# Patient Record
Sex: Male | Born: 1970 | Race: Black or African American | Hispanic: No | Smoking: Never smoker
Health system: Southern US, Community
[De-identification: ages and names within clinical notes are randomized; demographics above are authoritative.]

## PROBLEM LIST (undated history)

## (undated) DIAGNOSIS — E119 Type 2 diabetes mellitus without complications: Secondary | ICD-10-CM

## (undated) DIAGNOSIS — J45909 Unspecified asthma, uncomplicated: Secondary | ICD-10-CM

## (undated) DIAGNOSIS — I639 Cerebral infarction, unspecified: Secondary | ICD-10-CM

## (undated) DIAGNOSIS — I219 Acute myocardial infarction, unspecified: Secondary | ICD-10-CM

## (undated) DIAGNOSIS — K219 Gastro-esophageal reflux disease without esophagitis: Secondary | ICD-10-CM

## (undated) DIAGNOSIS — I10 Essential (primary) hypertension: Secondary | ICD-10-CM

## (undated) DIAGNOSIS — M5430 Sciatica, unspecified side: Secondary | ICD-10-CM

## (undated) DIAGNOSIS — L309 Dermatitis, unspecified: Secondary | ICD-10-CM

## (undated) HISTORY — DX: Type 2 diabetes mellitus without complications: E11.9

## (undated) HISTORY — DX: Gastro-esophageal reflux disease without esophagitis: K21.9

## (undated) HISTORY — DX: Dermatitis, unspecified: L30.9

## (undated) HISTORY — PX: HERNIA REPAIR: SHX51

## (undated) HISTORY — DX: Acute myocardial infarction, unspecified: I21.9

## (undated) HISTORY — DX: Cerebral infarction, unspecified: I63.9

---

## 1997-12-07 ENCOUNTER — Emergency Department (HOSPITAL_COMMUNITY): Admission: EM | Admit: 1997-12-07 | Discharge: 1997-12-07 | Payer: Self-pay | Admitting: Emergency Medicine

## 1999-05-29 ENCOUNTER — Emergency Department (HOSPITAL_COMMUNITY): Admission: EM | Admit: 1999-05-29 | Discharge: 1999-05-29 | Payer: Self-pay | Admitting: Emergency Medicine

## 2001-10-22 ENCOUNTER — Emergency Department (HOSPITAL_COMMUNITY): Admission: EM | Admit: 2001-10-22 | Discharge: 2001-10-22 | Payer: Self-pay | Admitting: Emergency Medicine

## 2003-01-29 ENCOUNTER — Emergency Department (HOSPITAL_COMMUNITY): Admission: EM | Admit: 2003-01-29 | Discharge: 2003-01-29 | Payer: Self-pay | Admitting: *Deleted

## 2003-01-29 ENCOUNTER — Encounter: Payer: Self-pay | Admitting: *Deleted

## 2006-12-24 ENCOUNTER — Observation Stay (HOSPITAL_COMMUNITY): Admission: EM | Admit: 2006-12-24 | Discharge: 2006-12-25 | Payer: Self-pay | Admitting: Emergency Medicine

## 2010-09-08 NOTE — Discharge Summary (Signed)
NAME:  Peter Bell, Peter Bell             ACCOUNT NO.:  [id number]   MEDICAL RECORD NO.:  [id number]          PATIENT TYPE:  INP   LOCATION:  3705                         FACILITY:  [facility]   PHYSICIAN:  Ritta Slot, MD     DATE OF BIRTH:  Jun 09, 1970   DATE OF ADMISSION:  12/24/2006  DATE OF DISCHARGE:  12/25/2006                               DISCHARGE SUMMARY   HISTORY OF PRESENT ILLNESS:  Mr. Lierman is a 40 year old African-  American male patient who came to the hospital because of chest pain.  He had right sided chest pain.  It was worse on the day he came to the  emergency room.  It apparently started two days prior at work.  He had  not seen a doctor for a long time.  His blood pressure on admission was  151/95.  He was seen by Dr. Lynnea Ferrier, thought he should be observed  overnight and ruled out for an MI because his first troponins they were  point of care markers in the ER were 0.19 and 0.28.  Thus, he was  admitted.  He was put of Lovenox, beta blocker and lisinopril and  aspirin, and statin.  His cardiac enzymes however came back negative and  troponins were negative.  On the morning of December 25, 2006 he was seen  by Dr. Lynnea Ferrier.  His blood pressure was stable at 121/72.  His heart  rate was 66.  His temperature was 98.9.  His labs were normal.  He was  considered stable for discharge home.   LABS:  Lipid profile - cholesterol 179, triglycerides 32, HDL 34, and  was 139.  Sodium was 139, potassium was 4.1, BUN was 8, creatinine was  0.88, chloride 107, CO2 28.  His TSH was 0.812.  Magnesium was 1.8.  D-  dimer was 0.36.  CBC - hemoglobin 14.2, hematocrit 42.2, WBCs 10.8 and  platelets are 281.  His EKG was normal sinus rhythm.  Chest x-ray showed  cardiac size upper limits of normal, otherwise no acute abnormality.   DISCHARGE MEDICATIONS:  1. Simvastatin 40 mg at bedtime everyday.  2. Aspirin 81 mg a day.  3. Metoprolol 50 mg a day.  4. Pepcid 20 mg a day.   Our office  will call him for an appointment for a treadmill Myoview  test, which should be done before Thursday his day to go back to work.  He is to follow up with his new primary care doctor, Dr. Lupe Carney  and he will follow up with Dr. Lynnea Ferrier after his treadmill test is  complete.      Lezlie Octave, N.P.      Ritta Slot, MD  Electronically Signed    BB/MEDQ  D:  12/25/2006  T:  12/25/2006  Job:  [id number]   cc:   L. Lupe Carney, M.D.

## 2010-09-08 NOTE — H&P (Signed)
NAME:  DAMAREA, MERKEL             ACCOUNT NO.:  [id number]   MEDICAL RECORD NO.:  [id number]          PATIENT TYPE:  INP   LOCATION:  3705                         FACILITY:  [facility]   PHYSICIAN:  Ritta Slot, MD     DATE OF BIRTH:  12-28-1970   DATE OF ADMISSION:  12/24/2006  DATE OF DISCHARGE:  12/25/2006                              HISTORY & PHYSICAL   HISTORY:  I had the pleasure of seeing Mr. Kiante who is a 40 year old  gentleman with onset of right-sided chest pain that has been going on  now for the past few days.  He came to the emergency room today because  the pain has been continuing and he did not want to let it go  unresolved.  He describes his pain as being right-sided, which feels  like sharp in nature with pain that is relieved by raising his right  arm.  Each episode lasts about 5 hours.  The pattern is episodic.  The  pain has no radiation.  It is characterized as sharp, he feels that the  tip of his index finger is numb on his right hand.  Symptoms described  as moderate.  Symptoms have no aggravating factors.  Raising his arm  above his head improves this.  There is no nausea, vomiting, dyspnea, or  fever associated with it.  It has been associated with a cough.  Concern  however that his troponin's here in the emergency room have come back  positive and have actually slightly increased from 0.19 to 0.28  nanograms per mL.  His ECGs also have been of concern and they have  shown LVH, but also shown T wave inversion at 2, 3, AVF, and nonspecific  T-wave flattening laterally.  He is now currently pain free.  He is  otherwise doing well.   PAST MEDICAL HISTORY:  Unremarkable.  He denies any cardiac risk factors  in terms of hypertension, dyslipidemia, smoking, or premature  history  of coronary artery disease.  His blood pressure on admission was noted  to be 161/95.   MEDICATIONS:  None.   ALLERGIES:  NO KNOWN DRUG ALLERGIES.   SOCIAL HISTORY:  Lives with  his girlfriend.  He works at [employer] and does a lot of walking for them.  He does not smoke, does not  drink alcohol, does not have any kids, does not use any illicit drugs.   FAMILY HISTORY:  Positive for his brother having had a stroke at the age  41 years old and hypertension amongst his siblings.   REVIEW OF SYSTEMS:  A 10-point review of systems are otherwise  unremarkable.   PHYSICAL EXAMINATION:  GENERAL:  He is a well-looking individual in no  acute distress.  He has no chest pain.  VITAL SIGNS:  Blood pressure is 105/80, heart rate 60.  NECK:  No jugular venous distention.  No carotid bruits.  No  thyromegaly.  HEENT:  Unremarkable.  CARDIOVASCULAR:  Essentially benign without any heaves or thrills.  Auscultation revealed heart sounds 1 and 2 without any murmurs, rubs, or  gallops.  LUNGS:  Clear to auscultation bilaterally.  Good air entry bilaterally.  No crepitations or rhonchi noted.  ABDOMEN:  Soft and nontender.  No hepatosplenomegaly.  Bowel sounds are  present.  EXTREMITIES:  Unremarkable.   RADIOGRAPHIC:  ECG showed normal sinus rhythm with LVH.  Nonspecific ST-  T wave changes inferolaterally that were dynamic.   LABORATORY DATA:  Showed a creatinine of 0.81.  A normal CBC count and a  normal BMET.   Chest x-ray was unremarkable.   Of note his troponin has gone from 0.19 to 0.28 nanograms/mL.   IMPRESSION:  The patient is a 40 year old gentleman with an undiagnosed  hypertension, now having chest pain admitted with unstable angina and  now chest pain free.   PLAN:  We will treat him with ACS protocol.  We will start him on:  1. Metoprolol 25 mg p.o. b.i.d.  2. Aspirin 325 mg daily.  3. Lisinopril 5 mg daily.  4. Zocor 40 mg daily.  5. Lovenox 70 mg subcu b.i.d.  6. Nitroglycerin p.r.n.  7. Serial EKGs and troponin's, we will review them in the morning.      Admitted to the telemetry bed.   My plan is to do a diagnostic cardiac  catheterization while he is an  inpatient here at [facility].      Ritta Slot, MD  Electronically Signed     HS/MEDQ  D:  12/24/2006  T:  12/25/2006  Job:  [id number]

## 2011-02-05 LAB — COMPREHENSIVE METABOLIC PANEL
ALT: 15
AST: 17
Albumin: 3.5
Alkaline Phosphatase: 82
BUN: 8
CO2: 26
Calcium: 8.8
Chloride: 104
Creatinine, Ser: 0.91
GFR calc Af Amer: >60
GFR calc non Af Amer: >60
Glucose, Bld: 110 — ABNORMAL HIGH
Potassium: 3.8
Sodium: 137
Total Bilirubin: 1.2
Total Protein: 7.5

## 2011-02-05 LAB — BASIC METABOLIC PANEL
BUN: 8
BUN: 8
CO2: 24
CO2: 28
Calcium: 8.8
Calcium: 9
Chloride: 106
Chloride: 107
Creatinine, Ser: 0.81
Creatinine, Ser: 0.88
GFR calc Af Amer: >60
GFR calc Af Amer: >60
GFR calc non Af Amer: >60
GFR calc non Af Amer: >60
Glucose, Bld: 101 — ABNORMAL HIGH
Glucose, Bld: 105 — ABNORMAL HIGH
Potassium: 3.8
Potassium: 4.1
Sodium: 137
Sodium: 139

## 2011-02-05 LAB — PROTIME-INR
INR: 1.1
Prothrombin Time: 14.6

## 2011-02-05 LAB — CBC
HCT: 41.9
HCT: 42.2
HCT: 43.6
Hemoglobin: 14.1
Hemoglobin: 14.2
Hemoglobin: 14.9
MCHC: 33.7
MCHC: 33.7
MCHC: 34.2
MCV: 93.3
MCV: 94
MCV: 94.1
Platelets: 281
Platelets: 301
Platelets: 307
RBC: 4.45
RBC: 4.49
RBC: 4.68
RDW: 12.6
RDW: 12.9
RDW: 13.1
WBC: 10.1
WBC: 10.8 — ABNORMAL HIGH
WBC: 11.1 — ABNORMAL HIGH

## 2011-02-05 LAB — APTT: aPTT: 30

## 2011-02-05 LAB — LIPID PANEL
Cholesterol: 171
Cholesterol: 179
HDL: 33 — ABNORMAL LOW
HDL: 34 — ABNORMAL LOW
LDL Cholesterol: 134 — ABNORMAL HIGH
LDL Cholesterol: 139 — ABNORMAL HIGH
Total CHOL/HDL Ratio: 5.2
Total CHOL/HDL Ratio: 5.3
Triglycerides: 19
Triglycerides: 32
VLDL: 4
VLDL: 6

## 2011-02-05 LAB — CARDIAC PANEL(CRET KIN+CKTOT+MB+TROPI)
CK, MB: 1.4
CK, MB: 1.8
Relative Index: 0.7
Relative Index: 0.9
Total CK: 158
Total CK: 249 — ABNORMAL HIGH
Troponin I: 0.02
Troponin I: 0.03

## 2011-02-05 LAB — D-DIMER, QUANTITATIVE: D-Dimer, Quant: 0.36

## 2011-02-05 LAB — I-STAT 8, (EC8 V) (CONVERTED LAB)
Acid-base deficit: 1
BUN: 10
Bicarbonate: 24.2 — ABNORMAL HIGH
Chloride: 108
Glucose, Bld: 111 — ABNORMAL HIGH
HCT: 51
Hemoglobin: 17.3 — ABNORMAL HIGH
Operator id: 257131
Potassium: 4.1
Sodium: 141
TCO2: 25
pCO2, Ven: 39.4 — ABNORMAL LOW
pH, Ven: 7.396 — ABNORMAL HIGH

## 2011-02-05 LAB — DIFFERENTIAL
Basophils Absolute: 0
Basophils Relative: 0
Eosinophils Absolute: 0
Eosinophils Relative: 0
Lymphocytes Relative: 9 — ABNORMAL LOW
Lymphs Abs: 1
Monocytes Absolute: 0.3
Monocytes Relative: 3
Neutro Abs: 9.8 — ABNORMAL HIGH
Neutrophils Relative %: 88 — ABNORMAL HIGH

## 2011-02-05 LAB — TROPONIN I: Troponin I: 0.03

## 2011-02-05 LAB — MAGNESIUM: Magnesium: 1.8

## 2011-02-05 LAB — POCT CARDIAC MARKERS
CKMB, poc: 4.7
CKMB, poc: 5.5
Myoglobin, poc: 90.1
Myoglobin, poc: 96.8
Operator id: 257131
Operator id: 257131
Troponin i, poc: 0.19 — ABNORMAL HIGH
Troponin i, poc: 0.28 — ABNORMAL HIGH

## 2011-02-05 LAB — RAPID URINE DRUG SCREEN, HOSP PERFORMED
Amphetamines: NOT DETECTED
Barbiturates: NOT DETECTED
Benzodiazepines: NOT DETECTED
Cocaine: NOT DETECTED
Opiates: POSITIVE — AB
Tetrahydrocannabinol: NOT DETECTED

## 2011-02-05 LAB — TSH: TSH: 0.812

## 2011-02-05 LAB — CK TOTAL AND CKMB (NOT AT ARMC)
CK, MB: 1.4
Relative Index: 0.9
Total CK: 148

## 2011-02-05 LAB — POCT I-STAT CREATININE
Creatinine, Ser: 0.9
Operator id: 257131

## 2012-01-18 ENCOUNTER — Emergency Department (HOSPITAL_COMMUNITY): Payer: BC Managed Care – HMO

## 2012-01-18 ENCOUNTER — Encounter (HOSPITAL_COMMUNITY): Payer: Self-pay | Admitting: Emergency Medicine

## 2012-01-18 ENCOUNTER — Emergency Department (HOSPITAL_COMMUNITY)
Admission: EM | Admit: 2012-01-18 | Discharge: 2012-01-18 | Disposition: A | Discharge status: Home Health | Payer: BC Managed Care – HMO | Attending: Emergency Medicine | Admitting: Emergency Medicine

## 2012-01-18 ENCOUNTER — Emergency Department (HOSPITAL_COMMUNITY)
Admission: EM | Admit: 2012-01-18 | Discharge: 2012-01-18 | Disposition: A | Discharge status: Nursing Home (Includes ICF) | Payer: BC Managed Care – HMO | Source: Home / Self Care | Attending: Family Medicine | Admitting: Family Medicine

## 2012-01-18 DIAGNOSIS — J45909 Unspecified asthma, uncomplicated: Secondary | ICD-10-CM | POA: Insufficient documentation

## 2012-01-18 DIAGNOSIS — I10 Essential (primary) hypertension: Secondary | ICD-10-CM

## 2012-01-18 DIAGNOSIS — R0789 Other chest pain: Secondary | ICD-10-CM

## 2012-01-18 DIAGNOSIS — R51 Headache: Secondary | ICD-10-CM | POA: Insufficient documentation

## 2012-01-18 DIAGNOSIS — R071 Chest pain on breathing: Secondary | ICD-10-CM | POA: Insufficient documentation

## 2012-01-18 HISTORY — DX: Essential (primary) hypertension: I10

## 2012-01-18 HISTORY — DX: Unspecified asthma, uncomplicated: J45.909

## 2012-01-18 LAB — CBC WITH DIFFERENTIAL/PLATELET
Blasts: 0 %
Eosinophils Absolute: 0.1 10*3/uL (ref 0.0–0.7)
Eosinophils Relative: 1 % (ref 0–5)
Lymphocytes Relative: 21 % (ref 12–46)
Lymphs Abs: 1.7 10*3/uL (ref 0.7–4.0)
MCV: 90.6 fL (ref 78.0–100.0)
Metamyelocytes Relative: 0 %
Monocytes Absolute: 0.5 10*3/uL (ref 0.1–1.0)
Monocytes Relative: 6 % (ref 3–12)
Neutro Abs: 5.9 10*3/uL (ref 1.7–7.7)
Platelets: 277 10*3/uL (ref 150–400)
RBC: 4.79 MIL/uL (ref 4.22–5.81)
WBC: 8.2 10*3/uL (ref 4.0–10.5)
nRBC: 0 /100 WBC

## 2012-01-18 LAB — URINALYSIS, ROUTINE W REFLEX MICROSCOPIC
Bilirubin Urine: NEGATIVE
Glucose, UA: NEGATIVE mg/dL
Ketones, ur: NEGATIVE mg/dL
Protein, ur: NEGATIVE mg/dL
Urobilinogen, UA: 0.2 mg/dL (ref 0.0–1.0)

## 2012-01-18 LAB — BASIC METABOLIC PANEL
BUN: 8 mg/dL (ref 6–23)
CO2: 27 mEq/L (ref 19–32)
Calcium: 9.3 mg/dL (ref 8.4–10.5)
Creatinine, Ser: 0.8 mg/dL (ref 0.50–1.35)
GFR calc non Af Amer: >90 mL/min (ref >90)
Glucose, Bld: 118 mg/dL — ABNORMAL HIGH (ref 70–99)
Sodium: 138 mEq/L (ref 135–145)

## 2012-01-18 LAB — URINE MICROSCOPIC-ADD ON

## 2012-01-18 MED ORDER — HYDROCHLOROTHIAZIDE 25 MG PO TABS: 12.5000 mg | ORAL_TABLET | Freq: Every day | ORAL | Status: DC

## 2012-01-18 MED ORDER — KETOROLAC TROMETHAMINE 15 MG/ML IJ SOLN
15.0000 mg | Freq: Once | INTRAMUSCULAR | Status: AC
  Administered 2012-01-18: 15 mg via INTRAVENOUS
  Filled 2012-01-18: qty 1

## 2012-01-18 NOTE — ED Provider Notes (Signed)
History     CSN: [id number]  Arrival date & time 01/18/12  0809   First MD Initiated Contact with Patient 01/18/12 [phone]      Chief Complaint  Patient presents with  . Hypertension    (Consider location/radiation/quality/duration/timing/severity/associated sxs/prior treatment) Patient is a 41 y.o. male presenting with hypertension. The history is provided by the patient.  Hypertension This is a new problem. The current episode started more than 2 days ago (dx'd approx 1.5 yr ago and started on med, took for 2 weeks , became wnl and med stopped,  onset 3 d ago of bad headaches and chest pains., assoc with elevated bp at home.). The problem has been gradually worsening. Associated symptoms include chest pain and headaches. Pertinent negatives include no shortness of breath.    Past Medical History  Diagnosis Date  . Asthma   . Hypertension     Past Surgical History  Procedure Date  . Hernia repair     No family history on file.  History  Substance Use Topics  . Smoking status: Never Smoker   . Smokeless tobacco: Not on file  . Alcohol Use: No      Review of Systems  Constitutional: Negative.   Eyes: Positive for visual disturbance.  Respiratory: Negative for shortness of breath.   Cardiovascular: Positive for chest pain. Negative for palpitations and leg swelling.  Neurological: Positive for headaches.    Allergies  Review of patient's allergies indicates no known allergies.  Home Medications   Current Outpatient Rx  Name Route Sig Dispense Refill  . HYDROCHLOROTHIAZIDE 25 MG PO TABS Oral Take 0.5 tablets (12.5 mg total) by mouth daily. 30 tablet 0  . ONE-DAILY MULTI VITAMINS PO TABS Oral Take 1 tablet by mouth daily.      BP 172/102  Pulse 68  Temp 98 F (36.7 C) (Oral)  Resp 16  SpO2 98%  Physical Exam  Nursing note and vitals reviewed. Constitutional: He is oriented to person, place, and time. He appears well-developed and well-nourished.  HENT:    Head: Normocephalic.  Right Ear: External ear normal.  Left Ear: External ear normal.  Mouth/Throat: Oropharynx is clear and moist.  Eyes: Conjunctivae normal and EOM are normal. Pupils are equal, round, and reactive to light.  Neck: Normal range of motion. Neck supple.  Cardiovascular: Normal rate, regular rhythm, normal heart sounds and intact distal pulses.   Pulmonary/Chest: Effort normal and breath sounds normal.  Abdominal: Soft. Bowel sounds are normal. There is no tenderness.  Musculoskeletal: He exhibits no edema.  Lymphadenopathy:    He has no cervical adenopathy.  Neurological: He is alert and oriented to person, place, and time.  Skin: Skin is warm and dry.    ED Course  Procedures (including critical care time)  Labs Reviewed - No data to display No results found.   1. Hypertension complications       MDM          Linna Hoff, MD 01/20/12 2050

## 2012-01-18 NOTE — ED Notes (Signed)
Results of troponin per POCT standards:   0.00 

## 2012-01-18 NOTE — ED Notes (Addendum)
Pt sent down from [facility] with htn associated with HA and CP x 3 days; pt denies complaint at present but sent for further eval; EKG performed at [facility]

## 2012-01-18 NOTE — ED Provider Notes (Signed)
History     CSN: [id number]  Arrival date & time 01/18/12  1027   First MD Initiated Contact with Patient 01/18/12 1022      Chief Complaint  Patient presents with  . Hypertension  . Headache  . Chest Pain    (Consider location/radiation/quality/duration/timing/severity/associated sxs/prior treatment) HPI Peter Bell is a 41 y.o. male w/ PMH of hypertension presents for chest pain, elevated BP and headache for 3 days. Hedescribes is chest pain as: left sided, non-radiating, aching, not associated with exertion, nausea/vomiting, diaphoresis, intermittent lasting for <1 hour when it is present, 4/10, not currently present. Has not taken anything for it.  Is not taking BP meds.  Denies smoking, illicit drugs or EtOH.  Headache is described as aching, frontal, aching, 4/10, relieved with excedrin, no numbness, dysarthria, coordination problems, tingling or weakness.  Pt complains of blurry vision when reading.  Past Medical History  Diagnosis Date  . Asthma   . Hypertension     Past Surgical History  Procedure Date  . Hernia repair     History reviewed. No pertinent family history.  History  Substance Use Topics  . Smoking status: Never Smoker   . Smokeless tobacco: Not on file  . Alcohol Use: No      Review of Systems At least 10pt or greater review of systems completed and are negative except where specified in the HPI.   Allergies  Review of patient's allergies indicates no known allergies.  Home Medications   Current Outpatient Rx  Name Route Sig Dispense Refill  . ONE-DAILY MULTI VITAMINS PO TABS Oral Take 1 tablet by mouth daily.      BP 155/90  Pulse 56  Temp 98.1 F (36.7 C) (Oral)  Resp 20  SpO2 100%  Physical Exam  Nursing notes reviewed.  Electronic medical record reviewed. VITAL SIGNS:   Filed Vitals:   01/18/12 1315 01/18/12 1330 01/18/12 1345 01/18/12 1358  BP: 133/80 120/78 126/75 126/75  Pulse: 52 54 56 57  Temp:        TempSrc:      Resp: 17 15 13 16   SpO2: 100% 100% 100% 100%   CONSTITUTIONAL: Awake, oriented, appears non-toxic HENT: Atraumatic, normocephalic, oral mucosa pink and moist, airway patent. Nares patent without drainage. External ears normal. EYES: Conjunctiva clear, EOMI, PERRLA NECK: Trachea midline, non-tender, supple CARDIOVASCULAR: Normal heart rate, Normal rhythm, No murmurs, rubs, gallops PULMONARY/CHEST: Clear to auscultation, no rhonchi, wheezes, or rales. Symmetrical breath sounds. Non-tender. ABDOMINAL: Non-distended, soft, non-tender - no rebound or guarding.  BS normal. NEUROLOGIC: Non-focal, moving all four extremities, no gross sensory or motor deficits. EXTREMITIES: No clubbing, cyanosis, or edema SKIN: Warm, Dry, No erythema, No rash  ED Course  Procedures (including critical care time)  Date: 01/19/2012  Rate: 58  Rhythm: normal sinus rhythm  QRS Axis: normal  Intervals: normal  ST/T Wave abnormalities: normal  Conduction Disutrbances: none  Narrative Interpretation: sinus bradycardia - asymptomatic - likely normal variant  Labs Reviewed  BASIC METABOLIC PANEL - Abnormal; Notable for the following:    Glucose, Bld 118 (*)     All other components within normal limits  URINALYSIS, ROUTINE W REFLEX MICROSCOPIC - Abnormal; Notable for the following:    Hgb urine dipstick TRACE (*)     Leukocytes, UA SMALL (*)     All other components within normal limits  CBC WITH DIFFERENTIAL  URINE MICROSCOPIC-ADD ON  LAB REPORT - SCANNED   Dg Chest 2 View  01/18/2012  *  RADIOLOGY REPORT*  Clinical Data: Intermittent chest pain for several weeks. Headaches.  History of hypertension.  CHEST - 2 VIEW  Comparison: 12/24/2006 radiographs.  Findings: The heart size and mediastinal contours are stable.  The lungs are clear.  There is no pleural effusion or pneumothorax.  A mild convex right lumbar scoliosis is noted.  IMPRESSION: No active cardiopulmonary process.   Original Report  Authenticated By: Peter Bell, M.D.      1. Chest wall pain   2. Hypertension   3. Headache     MDM  Peter Bell is a 41 y.o. male presenting with atypical chest pain, tension headache and mild hypertension.  Second BP measurement was 156/84, while interviewing pt, BP was 150's/80s.  Pt perseverates on "high blood pressure" because of brother with stroke at a young age, however is non-compliant with diet and exercise.  EKG is normal. Labs unremarkable.  Started on HCTZ - pt to see PCP on Thursday. I explained the diagnosis and have given explicit precautions to return to the ER including persistent chest pain associated with n/v diaphoresis or any other new or worsening symptoms. The patient understands and accepts the medical plan as it's been dictated and I have answered their questions. Discharge instructions concerning home care and prescriptions have been given.  The patient is STABLE and is discharged to home in good condition.        Peter Skene, MD 01/19/12 [phone]

## 2012-01-18 NOTE — ED Notes (Signed)
Pt placed back on cardiac monitor. No distress noted.

## 2012-01-18 NOTE — ED Notes (Signed)
Note sent to pharmacy to please send Toradol.

## 2012-01-18 NOTE — ED Notes (Signed)
Called pharmacy to receive Toradol. Stated that they will send it.

## 2012-01-18 NOTE — ED Notes (Signed)
Pt is here for high BP... Sx include: Headaches, hot flashes, blurry vision, chest pains that come and go... Denies: edema, SOB.... Was taken of his BP meds since he was controlling it through diet... Pt's been monitoring his BP at home and it has been running high.

## 2012-07-02 ENCOUNTER — Emergency Department (HOSPITAL_COMMUNITY): Payer: BC Managed Care – HMO

## 2012-07-02 ENCOUNTER — Encounter (HOSPITAL_COMMUNITY): Payer: Self-pay | Admitting: Emergency Medicine

## 2012-07-02 ENCOUNTER — Emergency Department (HOSPITAL_COMMUNITY)
Admission: EM | Admit: 2012-07-02 | Discharge: 2012-07-03 | Disposition: A | Discharge status: Home / Self Care | Payer: BC Managed Care – HMO | Attending: Emergency Medicine | Admitting: Emergency Medicine

## 2012-07-02 DIAGNOSIS — R112 Nausea with vomiting, unspecified: Secondary | ICD-10-CM | POA: Insufficient documentation

## 2012-07-02 DIAGNOSIS — R1031 Right lower quadrant pain: Secondary | ICD-10-CM | POA: Insufficient documentation

## 2012-07-02 DIAGNOSIS — R5381 Other malaise: Secondary | ICD-10-CM | POA: Insufficient documentation

## 2012-07-02 DIAGNOSIS — Z79899 Other long term (current) drug therapy: Secondary | ICD-10-CM | POA: Insufficient documentation

## 2012-07-02 DIAGNOSIS — J45909 Unspecified asthma, uncomplicated: Secondary | ICD-10-CM | POA: Insufficient documentation

## 2012-07-02 DIAGNOSIS — I10 Essential (primary) hypertension: Secondary | ICD-10-CM | POA: Insufficient documentation

## 2012-07-02 DIAGNOSIS — R509 Fever, unspecified: Secondary | ICD-10-CM | POA: Insufficient documentation

## 2012-07-02 DIAGNOSIS — R197 Diarrhea, unspecified: Secondary | ICD-10-CM | POA: Insufficient documentation

## 2012-07-02 DIAGNOSIS — K5289 Other specified noninfective gastroenteritis and colitis: Secondary | ICD-10-CM | POA: Insufficient documentation

## 2012-07-02 LAB — CBC WITH DIFFERENTIAL/PLATELET
Basophils Relative: 0 % (ref 0–1)
Eosinophils Absolute: 0 10*3/uL (ref 0.0–0.7)
Eosinophils Relative: 0 % (ref 0–5)
HCT: 41.3 % (ref 39.0–52.0)
Hemoglobin: 14.5 g/dL (ref 13.0–17.0)
MCH: 31.7 pg (ref 26.0–34.0)
MCHC: 35.1 g/dL (ref 30.0–36.0)
MCV: 90.2 fL (ref 78.0–100.0)
Monocytes Absolute: 0.4 10*3/uL (ref 0.1–1.0)
Monocytes Relative: 4 % (ref 3–12)
RDW: 13.7 % (ref 11.5–15.5)

## 2012-07-02 LAB — COMPREHENSIVE METABOLIC PANEL
Albumin: 3.7 g/dL (ref 3.5–5.2)
BUN: 11 mg/dL (ref 6–23)
Calcium: 8.7 mg/dL (ref 8.4–10.5)
Chloride: 98 mEq/L (ref 96–112)
Creatinine, Ser: 0.92 mg/dL (ref 0.50–1.35)
Total Bilirubin: 0.9 mg/dL (ref 0.3–1.2)
Total Protein: 8.6 g/dL — ABNORMAL HIGH (ref 6.0–8.3)

## 2012-07-02 LAB — LIPASE, BLOOD: Lipase: 9 U/L — ABNORMAL LOW (ref 11–59)

## 2012-07-02 MED ORDER — MORPHINE SULFATE 4 MG/ML IJ SOLN
4.0000 mg | Freq: Once | INTRAMUSCULAR | Status: AC
  Administered 2012-07-02: 4 mg via INTRAVENOUS
  Filled 2012-07-02: qty 1

## 2012-07-02 MED ORDER — SODIUM CHLORIDE 0.9 % IV BOLUS (SEPSIS)
1000.0000 mL | Freq: Once | INTRAVENOUS | Status: AC
  Administered 2012-07-02: 1000 mL via INTRAVENOUS

## 2012-07-02 MED ORDER — IOHEXOL 300 MG/ML  SOLN
100.0000 mL | Freq: Once | INTRAMUSCULAR | Status: AC | PRN
  Administered 2012-07-02: 100 mL via INTRAVENOUS

## 2012-07-02 MED ORDER — IOHEXOL 300 MG/ML  SOLN
50.0000 mL | Freq: Once | INTRAMUSCULAR | Status: AC | PRN
  Administered 2012-07-02: 50 mL via ORAL

## 2012-07-02 MED ORDER — ONDANSETRON HCL 4 MG/2ML IJ SOLN
4.0000 mg | Freq: Once | INTRAMUSCULAR | Status: AC
  Administered 2012-07-02: 4 mg via INTRAVENOUS
  Filled 2012-07-02: qty 2

## 2012-07-02 NOTE — ED Provider Notes (Signed)
History     CSN: [id number]  Arrival date & time 07/02/12  4098   First MD Initiated Contact with Patient 07/02/12 2212      Chief Complaint  Patient presents with  . Emesis  . Diarrhea    (Consider location/radiation/quality/duration/timing/severity/associated sxs/prior treatment) HPI Comments: Patient presents with nausea vomiting diarrhea started around noon today. He vomited about 10 times and had 4 episodes of nonbloody stools. He endorses chills is febrile to 101 on arrival. Denies any urinary symptoms or testicular pain. Denies any sick contacts. Denies any unusual foods. His abdominal pain is worse in the right side. Denies any chest pain, short of breath, difficulty breathing or swallowing.  The history is provided by the patient.    Past Medical History  Diagnosis Date  . Asthma   . Hypertension     Past Surgical History  Procedure Laterality Date  . Hernia repair      Family History  Problem Relation Age of Onset  . Cancer Mother   . Cancer Father   . Hypertension Sister   . Heart murmur Sister     History  Substance Use Topics  . Smoking status: Never Smoker   . Smokeless tobacco: Not on file  . Alcohol Use: No      Review of Systems  Constitutional: Positive for fever, chills, activity change, appetite change and fatigue.  HENT: Negative for congestion and rhinorrhea.   Respiratory: Negative for cough, chest tightness and shortness of breath.   Cardiovascular: Negative for chest pain.  Gastrointestinal: Positive for nausea, vomiting, abdominal pain and diarrhea.  Genitourinary: Negative for dysuria, hematuria and testicular pain.  Musculoskeletal: Negative for back pain.  Neurological: Negative for dizziness, weakness and headaches.  A complete 10 system review of systems was obtained and all systems are negative except as noted in the HPI and PMH.    Allergies  Review of patient's allergies indicates no known allergies.  Home Medications    Current Outpatient Rx  Name  Route  Sig  Dispense  Refill  . hydrochlorothiazide (HYDRODIURIL) 25 MG tablet   Oral   Take 25 mg by mouth daily.           BP 138/75  Pulse 96  Temp(Src) 101.8 F (38.8 C) (Oral)  Resp 18  Ht 5\' 3"  (1.6 m)  Wt 175 lb (79.379 kg)  BMI 31.01 kg/m2  SpO2 98%  Physical Exam  Constitutional: He is oriented to person, place, and time. He appears well-developed and well-nourished. No distress.  HENT:  Head: Normocephalic and atraumatic.  Mouth/Throat: Oropharynx is clear and moist. No oropharyngeal exudate.  Eyes: Conjunctivae and EOM are normal. Pupils are equal, round, and reactive to light.  Neck: Normal range of motion. Neck supple.  Cardiovascular: Normal rate, regular rhythm and normal heart sounds.   Pulmonary/Chest: Effort normal and breath sounds normal. No respiratory distress.  Abdominal: Soft. Bowel sounds are normal. There is tenderness. There is guarding.  TTP RLQ with guarding  Musculoskeletal: Normal range of motion. He exhibits no edema and no tenderness.  Neurological: He is alert and oriented to person, place, and time. No cranial nerve deficit. He exhibits normal muscle tone. Coordination normal.  Skin: Skin is warm.    ED Course  Procedures (including critical care time)  Labs Reviewed  CBC WITH DIFFERENTIAL - Abnormal; Notable for the following:    Neutrophils Relative 92 (*)    Neutro Abs 9.5 (*)    Lymphocytes Relative 4 (*)  Lymphs Abs 0.5 (*)    All other components within normal limits  COMPREHENSIVE METABOLIC PANEL - Abnormal; Notable for the following:    Glucose, Bld 158 (*)    Total Protein 8.6 (*)    All other components within normal limits  URINALYSIS, ROUTINE W REFLEX MICROSCOPIC - Abnormal; Notable for the following:    Hgb urine dipstick SMALL (*)    All other components within normal limits  LIPASE, BLOOD - Abnormal; Notable for the following:    Lipase 9 (*)    All other components within  normal limits  URINE MICROSCOPIC-ADD ON   Ct Abdomen Pelvis W Contrast  07/03/2012  *RADIOLOGY REPORT*  Clinical Data: Right lower quadrant abdominal pain  CT ABDOMEN AND PELVIS WITH CONTRAST  Technique:  Multidetector CT imaging of the abdomen and pelvis was performed following the standard protocol during bolus administration of intravenous contrast.  Contrast: OMNIPAQUE IOHEXOL 300 MG/ML  SOLN  Comparison: None.  Findings: Lung bases are clear.  Heart size upper normal. Nonspecific distension of the distal esophagus without wall thickening.  Unremarkable liver, biliary system, spleen, pancreas, adrenal glands.  Subcentimeter hypodensity upper pole right kidney, likely a cyst. Otherwise, symmetric renal enhancement.  No hydronephrosis or hydroureter.  Mild colonic diverticulosis.  No CT evidence for colitis or diverticulitis. Appendix within normal limits.  Small bowel loops are normal course and caliber.  Nonspecific gastric distension.  No free intraperitoneal air or fluid.  No lymphadenopathy.  Normal caliber aorta and branch vessels.  Circumferential bladder wall thickening, nonspecific given incomplete distension.  Mild multilevel degenerative changes.  Disc osteophyte complex at L5 S1 results in severe left paracentral canal and neural foraminal narrowing.  IMPRESSION: Circumferential bladder wall thickening is nonspecific given incomplete distension.  Correlate with urinalysis.  Otherwise, no acute abdominopelvic process identified.  Severe L5 S1 left paracentral/neural foraminal narrowing.   Original Report Authenticated By: Jearld Lesch, M.D.      No diagnosis found.    MDM  Nausea vomiting, fever diarrhea since noon today. Febrile on arrival. Tender to palpation the right lower quadrant. Could represent viral syndrome the patient has significant right lower quadrant tenderness. Labs, treat symptoms, IV fluids, CT scan to rule out appendicitis.  Appendix normal on CT. No  vomiting in ED.  Abdomen soft on reassessment. Tolerating PO.  Will treat supportively for gastroenteritis. Return precautions discussed.      Glynn Octave, MD 07/03/12 [phone]

## 2012-07-02 NOTE — ED Notes (Signed)
Pt states he has had N/V/D since about noon today  Pt states he started having chills around 1 or 2 today   Pt is c/o abd pain, back pain, and headache

## 2012-07-03 LAB — URINE MICROSCOPIC-ADD ON

## 2012-07-03 LAB — URINALYSIS, ROUTINE W REFLEX MICROSCOPIC
Bilirubin Urine: NEGATIVE
Glucose, UA: NEGATIVE mg/dL
Ketones, ur: NEGATIVE mg/dL
Leukocytes, UA: NEGATIVE
Nitrite: NEGATIVE
Protein, ur: NEGATIVE mg/dL
Specific Gravity, Urine: 1.03 (ref 1.005–1.030)
Urobilinogen, UA: 1 mg/dL (ref 0.0–1.0)
pH: 7 (ref 5.0–8.0)

## 2012-07-03 MED ORDER — ONDANSETRON HCL 4 MG PO TABS: 4.0000 mg | ORAL_TABLET | Freq: Four times a day (QID) | ORAL | Status: DC

## 2012-07-03 NOTE — ED Notes (Signed)
Pt back from CT

## 2012-07-03 NOTE — ED Notes (Signed)
Pt tolerated fluids without vomiting.

## 2012-07-03 NOTE — ED Notes (Signed)
Informed pt to take tylenol since a temperature of 99.36F does not cover me under protocol for tylenol. Pt was not given tylenol in triage and the MD saw the physician as I saw the patient for the first time and was not ordered any.

## 2013-04-24 ENCOUNTER — Encounter: Payer: Self-pay | Admitting: General Surgery

## 2013-04-24 DIAGNOSIS — R079 Chest pain, unspecified: Secondary | ICD-10-CM | POA: Insufficient documentation

## 2013-04-24 DIAGNOSIS — I10 Essential (primary) hypertension: Secondary | ICD-10-CM | POA: Insufficient documentation

## 2013-05-09 ENCOUNTER — Ambulatory Visit: Payer: BC Managed Care – HMO | Admitting: Cardiology

## 2013-05-10 ENCOUNTER — Encounter: Payer: Self-pay | Admitting: General Surgery

## 2013-05-10 ENCOUNTER — Encounter: Payer: Self-pay | Admitting: Cardiology

## 2013-05-10 ENCOUNTER — Ambulatory Visit (INDEPENDENT_AMBULATORY_CARE_PROVIDER_SITE_OTHER): Payer: BC Managed Care – HMO | Admitting: Cardiology

## 2013-05-10 VITALS — BP 136/100 | HR 71 | Ht 64.5 in | Wt 175.4 lb

## 2013-05-10 DIAGNOSIS — R079 Chest pain, unspecified: Secondary | ICD-10-CM

## 2013-05-10 DIAGNOSIS — I10 Essential (primary) hypertension: Secondary | ICD-10-CM

## 2013-05-10 DIAGNOSIS — R0602 Shortness of breath: Secondary | ICD-10-CM | POA: Insufficient documentation

## 2013-05-10 MED ORDER — AMLODIPINE BESYLATE 5 MG PO TABS: 5.0000 mg | ORAL_TABLET | Freq: Every day | ORAL | Status: DC

## 2013-05-10 NOTE — Patient Instructions (Signed)
Your physician has recommended you make the following change in your medication: Start amlodipine 5 MG Daily  Your physician has requested that you have an echocardiogram. Echocardiography is a painless test that uses sound waves to create images of your heart. It provides your doctor with information about the size and shape of your heart and how well your heart's chambers and valves are working. This procedure takes approximately one hour. There are no restrictions for this procedure.  Your physician has requested that you have an exercise stress myoview. For further information please visit [email or web address]. Please follow instruction sheet, as given.  Your physician recommends that you schedule a follow-up appointment in: 2 weeks for BP check with Nurse

## 2013-05-10 NOTE — Progress Notes (Signed)
[id number] [place], [place]  [address] Phone: [phone] Fax:  [phone]  Date:  05/10/2013   ID:  Peter CaterMarcus A Krage, DOB 05/02/1970, MRN [id number]  PCP:  Lupe CarneyMITCHELL,DEAN, MD  Cardiologist:  Armanda Magicraci Turner, MD     History of Present Illness: Peter Bell is a 43 y.o. male with a history of GERD, Asthma and HTN presents for evaluation of chest pain.  He has been having CP several times weekly for about a year.  He describes it as a sharp in the midsternal region with occasional tightening of his chest.  There is no radiation of the pain but occasionally his left hand will feel numb.  He occasionally will get SOB with the pain and occasionally will have some diaphoresis and nausea.  It usually lasts 2-3 minutes and resolves.  He denies any DOE, LE edema, dizziness or syncope.     Wt Readings from Last 3 Encounters:  05/10/13 175 lb 6.4 oz (79.561 kg)  04/24/13 174 lb (78.926 kg)  07/02/12 175 lb (79.379 kg)     Past Medical History  Diagnosis Date  . Asthma   . Hypertension   . GERD (gastroesophageal reflux disease)   . Eczema     Current Outpatient Prescriptions  Medication Sig Dispense Refill  . cyclobenzaprine (FLEXERIL) 5 MG tablet Take 5 mg by mouth 3 (three) times daily as needed for muscle spasms.      . fluocinonide cream (LIDEX) 0.05 % Apply 1 application topically 2 (two) times daily.      . hydrochlorothiazide (HYDRODIURIL) 25 MG tablet Take 25 mg by mouth daily.      . ondansetron (ZOFRAN) 4 MG tablet Take 1 tablet (4 mg total) by mouth every 6 (six) hours.  12 tablet  0  . pantoprazole (PROTONIX) 40 MG tablet Take 40 mg by mouth daily.      . sertraline (ZOLOFT) 50 MG tablet Take 50 mg by mouth daily.      . tadalafil (CIALIS) 5 MG tablet Take 5 mg by mouth daily as needed for erectile dysfunction.       No current facility-administered medications for this visit.    Allergies:   No Known Allergies  Social History:  The patient  reports that  he has never smoked. He does not have any smokeless tobacco history on file. He reports that he does not drink alcohol or use illicit drugs.   Family History:  The patient's family history includes Cancer in his father and mother; Heart murmur in his sister; Hypertension in his sister.   ROS:  Please see the history of present illness.      All other systems reviewed and negative.   PHYSICAL EXAM: VS:  BP 136/100  Pulse 71  Ht 5' 4.5" (1.638 m)  Wt 175 lb 6.4 oz (79.561 kg)  BMI 29.65 kg/m2 Well nourished, well developed, in no acute distress HEENT: normal Neck: no JVD Cardiac:  normal S1, S2; RRR; no murmur Lungs:  clear to auscultation bilaterally, no wheezing, rhonchi or rales Abd: soft, nontender, no hepatomegaly Ext: no edema Skin: warm and dry Neuro:  CNs 2-12 intact, no focal abnormalities noted  EKG:     NSR with no ST changes  ASSESSMENT AND PLAN:  1. Chest pain with CFR including age>40, male sex and HTN .  EKG is nonischemic.  - Stress myoview to rule out ischemia 2.  HTN - elevated DBP on exam today  - Continue  HCTZ  - Add Amlodipine 5mg  daily 3.  SOB   - 2D echo to assess LVF  Followup with my nurse in 2 weeks for BP check  Signed, Armanda Magic, MD 05/10/2013 9:47 AM

## 2013-05-18 ENCOUNTER — Emergency Department (HOSPITAL_COMMUNITY)
Admission: EM | Admit: 2013-05-18 | Discharge: 2013-05-18 | Disposition: A | Discharge status: Home / Self Care | Payer: BC Managed Care – PPO | Attending: Emergency Medicine | Admitting: Emergency Medicine

## 2013-05-18 ENCOUNTER — Emergency Department (HOSPITAL_COMMUNITY): Payer: BC Managed Care – PPO

## 2013-05-18 ENCOUNTER — Encounter (HOSPITAL_COMMUNITY): Payer: Self-pay | Admitting: Emergency Medicine

## 2013-05-18 DIAGNOSIS — J45909 Unspecified asthma, uncomplicated: Secondary | ICD-10-CM | POA: Insufficient documentation

## 2013-05-18 DIAGNOSIS — L259 Unspecified contact dermatitis, unspecified cause: Secondary | ICD-10-CM | POA: Insufficient documentation

## 2013-05-18 DIAGNOSIS — Z9889 Other specified postprocedural states: Secondary | ICD-10-CM | POA: Insufficient documentation

## 2013-05-18 DIAGNOSIS — I10 Essential (primary) hypertension: Secondary | ICD-10-CM | POA: Insufficient documentation

## 2013-05-18 DIAGNOSIS — K219 Gastro-esophageal reflux disease without esophagitis: Secondary | ICD-10-CM | POA: Insufficient documentation

## 2013-05-18 DIAGNOSIS — Z79899 Other long term (current) drug therapy: Secondary | ICD-10-CM | POA: Insufficient documentation

## 2013-05-18 LAB — CBC WITH DIFFERENTIAL/PLATELET
BASOS ABS: 0 10*3/uL (ref 0.0–0.1)
Basophils Relative: 0 % (ref 0–1)
EOS PCT: 0 % (ref 0–5)
Eosinophils Absolute: 0 10*3/uL (ref 0.0–0.7)
HCT: 42.7 % (ref 39.0–52.0)
Hemoglobin: 15.2 g/dL (ref 13.0–17.0)
LYMPHS ABS: 2.6 10*3/uL (ref 0.7–4.0)
LYMPHS PCT: 23 % (ref 12–46)
MCH: 31.4 pg (ref 26.0–34.0)
MCHC: 35.6 g/dL (ref 30.0–36.0)
MCV: 88.2 fL (ref 78.0–100.0)
Monocytes Absolute: 0.9 10*3/uL (ref 0.1–1.0)
Monocytes Relative: 8 % (ref 3–12)
NEUTROS ABS: 7.4 10*3/uL (ref 1.7–7.7)
Neutrophils Relative %: 68 % (ref 43–77)
PLATELETS: 314 10*3/uL (ref 150–400)
RBC: 4.84 MIL/uL (ref 4.22–5.81)
RDW: 13.2 % (ref 11.5–15.5)
WBC: 10.9 10*3/uL — AB (ref 4.0–10.5)

## 2013-05-18 LAB — COMPREHENSIVE METABOLIC PANEL
ALK PHOS: 127 U/L — AB (ref 39–117)
ALT: 20 U/L (ref 0–53)
AST: 21 U/L (ref 0–37)
Albumin: 3.9 g/dL (ref 3.5–5.2)
BILIRUBIN TOTAL: 0.8 mg/dL (ref 0.3–1.2)
BUN: 8 mg/dL (ref 6–23)
CALCIUM: 9.1 mg/dL (ref 8.4–10.5)
CHLORIDE: 98 meq/L (ref 96–112)
CO2: 27 meq/L (ref 19–32)
Creatinine, Ser: 0.81 mg/dL (ref 0.50–1.35)
GLUCOSE: 147 mg/dL — AB (ref 70–99)
POTASSIUM: 3.7 meq/L (ref 3.7–5.3)
SODIUM: 138 meq/L (ref 137–147)
Total Protein: 8.8 g/dL — ABNORMAL HIGH (ref 6.0–8.3)

## 2013-05-18 LAB — TROPONIN I: Troponin I: <0.3 ng/mL (ref <0.30)

## 2013-05-18 NOTE — Discharge Instructions (Signed)

## 2013-05-18 NOTE — ED Provider Notes (Signed)
CSN: [id number]     Arrival date & time 05/18/13  2001 History   First MD Initiated Contact with Patient 05/18/13 2305     Chief Complaint  Patient presents with  . multiple complaints    (Consider location/radiation/quality/duration/timing/severity/associated sxs/prior Treatment) HPI This patient is a 43 year old nonsmoker with a history of hypertension, asthma and gastroesophageal reflux disease. He presents with complaints of burning, left sided epigastric pain radiating into the left chest. His symptoms began about an hour prior to his arrival. He has had some mild associated nausea. No vomiting. He's had similar episodes of pain, on average once every couple of months, for the past several years. He is seeing his primary care physician has diagnosed him with GERD. Patient takes protonic.  The patient's pain lasted about 90 minutes. It has resolved completely without intervention. The patient denies any associated respiratory symptoms, diaphoresis, lightheadedness. He has no history of coronary artery disease. His only risk factors hypertension. No history of peptic ulcer disease, per the patient. No cocaine use.   Past Medical History  Diagnosis Date  . Asthma   . Hypertension   . GERD (gastroesophageal reflux disease)   . Eczema    Past Surgical History  Procedure Laterality Date  . Hernia repair     Family History  Problem Relation Age of Onset  . Cancer Mother   . Cancer Father   . Hypertension Sister   . Heart murmur Sister   . CVA Brother   . CVA Brother   . Hypertension Brother    History  Substance Use Topics  . Smoking status: Never Smoker   . Smokeless tobacco: Not on file  . Alcohol Use: Yes     Comment: occasionally    Review of Systems Ten point review of symptoms performed and is negative with the exception of symptoms noted above.    Allergies  Review of patient's allergies indicates no known allergies.  Home Medications   Current Outpatient Rx   Name  Route  Sig  Dispense  Refill  . amLODipine (NORVASC) 5 MG tablet   Oral   Take 1 tablet (5 mg total) by mouth daily.   30 tablet   11   . cyclobenzaprine (FLEXERIL) 5 MG tablet   Oral   Take 5 mg by mouth 3 (three) times daily as needed for muscle spasms.         . fluocinonide cream (LIDEX) 0.05 %   Topical   Apply 1 application topically 2 (two) times daily.         . hydrochlorothiazide (HYDRODIURIL) 25 MG tablet   Oral   Take 25 mg by mouth daily.         . ondansetron (ZOFRAN) 4 MG tablet   Oral   Take 1 tablet (4 mg total) by mouth every 6 (six) hours.   12 tablet   0   . pantoprazole (PROTONIX) 40 MG tablet   Oral   Take 40 mg by mouth daily.          BP 132/88  Pulse 92  Temp(Src) 97.9 F (36.6 C) (Oral)  Resp 16  Ht 5\' 5"  (1.651 m)  Wt 172 lb (78.019 kg)  BMI 28.62 kg/m2  SpO2 96% Physical Exam Gen: well developed and well nourished appearing Head: NCAT Eyes: PERL, EOMI Nose: no epistaixis or rhinorrhea Mouth/throat: mucosa is moist and pink Neck: supple, no stridor Lungs: CTA B, no wheezing, rhonchi or rales Chest wall: Mild tenderness to  palpation over the left anteromedial chest wall CV: RRR, no murmur, extremities appear well perfused.  Abd: soft, notender, nondistended Back: no ttp, no cva ttp Skin: warm and dry Ext: normal to inspection, no dependent edema Neuro: CN ii-xii grossly intact, no focal deficits Psyche; normal affect,  calm and cooperative.   ED Course  Procedures (including critical care time) Labs Review Labs Reviewed  CBC WITH DIFFERENTIAL - Abnormal; Notable for the following:    WBC 10.9 (*)    All other components within normal limits  COMPREHENSIVE METABOLIC PANEL - Abnormal; Notable for the following:    Glucose, Bld 147 (*)    Total Protein 8.8 (*)    Alkaline Phosphatase 127 (*)    All other components within normal limits  TROPONIN I   Imaging Review Dg Chest 2 View  05/18/2013   CLINICAL  DATA:  Chest pain.  Hypertension  EXAM: CHEST  2 VIEW  COMPARISON:  None.  FINDINGS: The heart size and mediastinal contours are within normal limits. Both lungs are clear. The visualized skeletal structures are unremarkable.  IMPRESSION: No active cardiopulmonary disease.   Electronically Signed   By: Signa Kellaylor  Stroud M.D.   On: 05/18/2013 21:30   EKG: nsr, no acute ischemic changes, normal intervals, normal axis, normal qrs complex MDM  DDX: ACS, pneumothorax, pneumonia, pericardial or pleural effusion, gastritis, GERD/PUD, musculoskeletal pain.   We're able to clinically rule out ACS, pneumothorax, pneumonia, no signs of pericarditis on EKG. The patient has a benign abdominal exam making pancreatitis and cholecystitis and colitis very unlikely. He has had similar symptoms in the past related to GERD. The patient is asymptomatic without intervention. I discussed clinical diagnosis of GERD with the patient as well as dietary management. He will follow up with his primary care physician on Monday. He'll continue all current medications.    Brandt LoosenJulie Briannie Gutierrez, MD 05/18/13 [phone]

## 2013-05-18 NOTE — ED Notes (Signed)
The pt reports that he had the same complaints last week.  He is scheduled for a cardiac work up in a few weeks.  He has had 2 ekgs that he was told was normal

## 2013-05-18 NOTE — ED Notes (Signed)
The pt feels nauseated today with a headache and some diarrhea and chest congestion coughing.  Sometimes he has sob also.  He thinks his bp is high

## 2013-05-28 ENCOUNTER — Ambulatory Visit (HOSPITAL_COMMUNITY): Payer: BC Managed Care – PPO | Attending: Cardiovascular Disease | Admitting: Radiology

## 2013-05-28 ENCOUNTER — Encounter: Payer: Self-pay | Admitting: Cardiovascular Disease

## 2013-05-28 VITALS — BP 124/78 | HR 55 | Ht 65.0 in | Wt 169.0 lb

## 2013-05-28 DIAGNOSIS — I379 Nonrheumatic pulmonary valve disorder, unspecified: Secondary | ICD-10-CM | POA: Insufficient documentation

## 2013-05-28 DIAGNOSIS — I10 Essential (primary) hypertension: Secondary | ICD-10-CM | POA: Insufficient documentation

## 2013-05-28 DIAGNOSIS — R0602 Shortness of breath: Secondary | ICD-10-CM

## 2013-05-28 DIAGNOSIS — R0789 Other chest pain: Secondary | ICD-10-CM | POA: Insufficient documentation

## 2013-05-28 DIAGNOSIS — R0989 Other specified symptoms and signs involving the circulatory and respiratory systems: Secondary | ICD-10-CM | POA: Insufficient documentation

## 2013-05-28 DIAGNOSIS — J45909 Unspecified asthma, uncomplicated: Secondary | ICD-10-CM | POA: Insufficient documentation

## 2013-05-28 DIAGNOSIS — Z8249 Family history of ischemic heart disease and other diseases of the circulatory system: Secondary | ICD-10-CM | POA: Insufficient documentation

## 2013-05-28 DIAGNOSIS — R079 Chest pain, unspecified: Secondary | ICD-10-CM

## 2013-05-28 DIAGNOSIS — R0609 Other forms of dyspnea: Secondary | ICD-10-CM | POA: Insufficient documentation

## 2013-05-28 DIAGNOSIS — R072 Precordial pain: Secondary | ICD-10-CM

## 2013-05-28 MED ORDER — TECHNETIUM TC 99M SESTAMIBI GENERIC - CARDIOLITE
10.0000 | Freq: Once | INTRAVENOUS | Status: AC | PRN
  Administered 2013-05-28: 10 via INTRAVENOUS

## 2013-05-28 MED ORDER — TECHNETIUM TC 99M SESTAMIBI GENERIC - CARDIOLITE
30.0000 | Freq: Once | INTRAVENOUS | Status: AC | PRN
  Administered 2013-05-28: 30 via INTRAVENOUS

## 2013-05-28 NOTE — Progress Notes (Signed)
[facility] SITE 3 NUCLEAR MED [address] [place], [place] [address] [phone]    Cardiology Nuclear Med Study  Peter Bell is a 43 y.o. male     MRN : [id number]     DOB: 08/16/1970  Procedure Date: 05/28/2013  Nuclear Med Background Indication for Stress Test:  Evaluation for Ischemia History:  No prior known history of CAD, and 05-28-13 Echo: EF=55-60% Cardiac Risk Factors: Family History - CAD and Hypertension  Symptoms: Chest Pressure with/without exertion (last occurrence couple weeks ago), SOB   Nuclear Pre-Procedure Caffeine/Decaff Intake:  9:00pm NPO After: 9:00pm   Lungs:  clear O2 Sat: 95% on room air. IV 0.9% NS with Angio Cath:  20g  IV Site: R Hand  IV Started by:  Peter Parsonsynthia Hasspacher, RN  Chest Size (in):  40 Cup Size: n/a  Height: 5\' 5"  (1.651 m)  Weight:  169 lb (76.658 kg)  BMI:  Body mass index is 28.12 kg/(m^2). Tech Comments:  No meds this am    Nuclear Med Study 1 or 2 day study: 1 day  Stress Test Type:  Stress  Reading MD: Peter PatesPaula Dareon Nunziato, MD  Order Authorizing Provider:  Armanda Magicraci Turner, MD  Resting Radionuclide: Technetium 7158m Sestamibi  Resting Radionuclide Dose: 11.0 mCi   Stress Radionuclide:  Technetium 6558m Sestamibi  Stress Radionuclide Dose: 33.0 mCi           Stress Protocol Rest HR: 55 Stress HR: 169  Rest BP: 124/78 Stress BP: 171/70  Exercise Time (min): 8:00 METS: 9.7   Predicted Max HR: 176 bpm % Max HR: 96.02 bpm Rate Pressure Product: [id number]   Dose of Adenosine (mg):  n/a Dose of Lexiscan: n/a mg  Dose of Atropine (mg): n/a Dose of Dobutamine: n/a mcg/kg/min (at max HR)  Stress Test Technologist: Peter HongPatsy Edwards, RN  Nuclear Technologist:  Peter Bell, CNMT     Rest Procedure:  Myocardial perfusion imaging was performed at rest 45 minutes following the intravenous administration of Technetium 258m Sestamibi. Rest ECG: NSR - Normal EKG  Stress Procedure:  The patient exercised on the treadmill utilizing the  Bruce Protocol for 8:00 minutes, RPE=17.  The patient stopped due to Fatigue and denied any chest pain. There was a drop in BP, 150/63, at peak exercise, and BP 136/58 immediately in recovery. Technetium 10058m Sestamibi was injected at peak exercise and myocardial perfusion imaging was performed after a brief delay. Stress ECG: No significant change from baseline ECG  QPS Raw Data Images:  Soft tissue (diaphragm) underlies heart.   Stress Images:  Normal homogeneous uptake in all areas of the myocardium. Rest Images:  Normal homogeneous uptake in all areas of the myocardium. Subtraction (SDS):  No evidence of ischemia. Transient Ischemic Dilatation (Normal <1.22):  1.05 Lung/Heart Ratio (Normal <0.45):  0.31  Quantitative Gated Spect Images QGS EDV:  97 ml QGS ESV:  51 ml  Impression Exercise Capacity:  Good exercise capacity. BP Response: Drop in BP at peak exercise (mild) and in immediate recovery  Rapidly increased.  Clinical Symptoms:  No significant symptoms noted. ECG Impression:  No significant ST segment change suggestive of ischemia. Comparison with Prior Nuclear Study: No previous nuclear study performed  Overall Impression:  Normal stress nuclear study.  LV Ejection Fraction: 48%.  LV Wall Motion: Mild lateral hypokinesis.  Would consider echo to further evaluate LVEF and wall motion.    Peter Bell

## 2013-05-28 NOTE — Progress Notes (Signed)
Echocardiogram performed.  

## 2015-01-21 ENCOUNTER — Encounter: Payer: BLUE CROSS/BLUE SHIELD | Attending: Internal Medicine

## 2015-01-21 VITALS — Ht 65.0 in | Wt 168.1 lb

## 2015-01-21 DIAGNOSIS — E119 Type 2 diabetes mellitus without complications: Secondary | ICD-10-CM | POA: Diagnosis not present

## 2015-01-21 DIAGNOSIS — Z713 Dietary counseling and surveillance: Secondary | ICD-10-CM | POA: Diagnosis not present

## 2015-01-21 DIAGNOSIS — Z794 Long term (current) use of insulin: Secondary | ICD-10-CM | POA: Insufficient documentation

## 2015-01-21 NOTE — Progress Notes (Signed)
Patient was seen on 01/21/15 for the first of a series of three diabetes self-management courses at the [facility].  Patient Education Plan per assessed needs and concerns is to attend four course education program for Diabetes Self Management Education.  The following learning objectives were met by the patient during this class:  Describe diabetes  State some common risk factors for diabetes  Defines the role of glucose and insulin  Identifies type of diabetes and pathophysiology  Describe the relationship between diabetes and cardiovascular risk  State the members of the Healthcare Team  States the rationale for glucose monitoring  State when to test glucose  State their individual Target Range  State the importance of logging glucose readings  Describe how to interpret glucose readings  Identifies A1C target  Explain the correlation between A1c and eAG values  State symptoms and treatment of high blood glucose  State symptoms and treatment of low blood glucose  Explain proper technique for glucose testing  Identifies proper sharps disposal  Handouts given during class include:  Living Well with Diabetes book  Carb Counting and Meal Planning book  Meal Plan Card  Carbohydrate guide  Meal planning worksheet  Low Sodium Flavoring Tips  The diabetes portion plate  K3T to eAG Conversion Chart  Diabetes Medications  Diabetes Recommended Care Schedule  Support Group  Diabetes Success Plan  Core Class Satisfaction Survey  Follow-Up Plan:  Attend core 2

## 2015-01-28 ENCOUNTER — Encounter: Payer: BLUE CROSS/BLUE SHIELD | Attending: Internal Medicine

## 2015-01-28 DIAGNOSIS — Z794 Long term (current) use of insulin: Secondary | ICD-10-CM | POA: Diagnosis not present

## 2015-01-28 DIAGNOSIS — Z713 Dietary counseling and surveillance: Secondary | ICD-10-CM | POA: Diagnosis not present

## 2015-01-28 DIAGNOSIS — E119 Type 2 diabetes mellitus without complications: Secondary | ICD-10-CM | POA: Diagnosis present

## 2015-01-28 NOTE — Progress Notes (Signed)

## 2015-02-04 DIAGNOSIS — E119 Type 2 diabetes mellitus without complications: Secondary | ICD-10-CM

## 2015-02-04 NOTE — Progress Notes (Signed)
Patient was seen on 02/04/15 for the third of a series of three diabetes self-management courses at the [facility].   Janene Madeira the amount of activity recommended for healthy living . Describe activities suitable for individual needs . Identify ways to regularly incorporate activity into daily life . Identify barriers to activity and ways to over come these barriers  Identify diabetes medications being personally used and their primary action for lowering glucose and possible side effects . Describe role of stress on blood glucose and develop strategies to address psychosocial issues . Identify diabetes complications and ways to prevent them  Explain how to manage diabetes during illness . Evaluate success in meeting personal goal . Establish 2-3 goals that they will plan to diligently work on until they return for the  33-month follow-up visit  Goals:   I will count my carb choices at most meals and snacks  I will be active 30 minutes or more 3-4 times a week  I will take my diabetes medications as scheduled  I will eat less unhealthy fats by eating less sugar/carbs  I will test my glucose at least 2-3 times a day, 7 days a week  I will look at patterns in my record book at least 7 days a month  To help manage stress I will  workout at least 4 times a week  Your patient has identified these potential barriers to change:  Motivation  Your patient has identified their diabetes self-care support plan as  American Diabetes AssociationOn-line Resources Plan:  Attend Optional Core 4 in 4 months

## 2015-10-05 ENCOUNTER — Encounter (HOSPITAL_COMMUNITY): Payer: Self-pay | Admitting: Emergency Medicine

## 2015-10-05 ENCOUNTER — Emergency Department (HOSPITAL_COMMUNITY): Payer: BLUE CROSS/BLUE SHIELD

## 2015-10-05 ENCOUNTER — Emergency Department (HOSPITAL_COMMUNITY)
Admission: EM | Admit: 2015-10-05 | Discharge: 2015-10-06 | Disposition: A | Discharge status: Home / Self Care | Payer: BLUE CROSS/BLUE SHIELD | Attending: Emergency Medicine | Admitting: Emergency Medicine

## 2015-10-05 DIAGNOSIS — R51 Headache: Secondary | ICD-10-CM | POA: Insufficient documentation

## 2015-10-05 DIAGNOSIS — J45909 Unspecified asthma, uncomplicated: Secondary | ICD-10-CM | POA: Diagnosis not present

## 2015-10-05 DIAGNOSIS — R519 Headache, unspecified: Secondary | ICD-10-CM

## 2015-10-05 DIAGNOSIS — Z7984 Long term (current) use of oral hypoglycemic drugs: Secondary | ICD-10-CM | POA: Insufficient documentation

## 2015-10-05 DIAGNOSIS — R079 Chest pain, unspecified: Secondary | ICD-10-CM | POA: Diagnosis present

## 2015-10-05 DIAGNOSIS — Z794 Long term (current) use of insulin: Secondary | ICD-10-CM | POA: Diagnosis not present

## 2015-10-05 DIAGNOSIS — I10 Essential (primary) hypertension: Secondary | ICD-10-CM | POA: Insufficient documentation

## 2015-10-05 DIAGNOSIS — E119 Type 2 diabetes mellitus without complications: Secondary | ICD-10-CM | POA: Diagnosis not present

## 2015-10-05 DIAGNOSIS — R0789 Other chest pain: Secondary | ICD-10-CM | POA: Diagnosis not present

## 2015-10-05 LAB — BASIC METABOLIC PANEL
ANION GAP: 12 (ref 5–15)
BUN: <5 mg/dL — ABNORMAL LOW (ref 6–20)
CO2: 23 mmol/L (ref 22–32)
Calcium: 8.9 mg/dL (ref 8.9–10.3)
Chloride: 99 mmol/L — ABNORMAL LOW (ref 101–111)
Creatinine, Ser: 0.86 mg/dL (ref 0.61–1.24)
GFR calc non Af Amer: >60 mL/min (ref >60)
Glucose, Bld: 314 mg/dL — ABNORMAL HIGH (ref 65–99)
Potassium: 3.6 mmol/L (ref 3.5–5.1)
Sodium: 134 mmol/L — ABNORMAL LOW (ref 135–145)

## 2015-10-05 LAB — CBC
HCT: 41.3 % (ref 39.0–52.0)
HEMOGLOBIN: 14.2 g/dL (ref 13.0–17.0)
MCH: 30.3 pg (ref 26.0–34.0)
MCHC: 34.4 g/dL (ref 30.0–36.0)
MCV: 88.1 fL (ref 78.0–100.0)
Platelets: 310 10*3/uL (ref 150–400)
RBC: 4.69 MIL/uL (ref 4.22–5.81)
RDW: 13.1 % (ref 11.5–15.5)
WBC: 12.9 10*3/uL — ABNORMAL HIGH (ref 4.0–10.5)

## 2015-10-05 LAB — I-STAT TROPONIN, ED: TROPONIN I, POC: 0 ng/mL (ref 0.00–0.08)

## 2015-10-05 NOTE — ED Provider Notes (Signed)
CSN: [id number]     Arrival date & time 10/05/15  2135 History  By signing my name below, I, Arianna Nassar, attest that this documentation has been prepared under the direction and in the presence of Shon Baton, MD.  Electronically Signed: Octavia Heir, ED Scribe. 10/06/2015. 12:07 AM.    Chief Complaint  Patient presents with  . Chest Pain      The history is provided by the patient. No language interpreter was used.   HPI Comments: Peter Bell is a 45 y.o. male who has a PMHx of asthma, HTN, GERD, eczema, and DM presents to the Emergency Department complaining of sudde. n onset, 5/10, gradual worsening, intermittent central chest pain onset onset about 7 hours ago. He notes associated numbness in his left arm and intense 6.5/10 headache with photophobia that started yesterday. Pt notes each episode comes every 40-45 seconds and lasts about 10 seconds. Pt says when he takes a deep breath or lifts his arms the pain increases. Pt notes he gets headaches occasionally and he has seen a cardiologist in the past for similar chest pain. Pt is non-compliant with his HTN medication. He denies fever, cough, shortness of breath, abdominal pain, nausea, vomiting. Pt is a non-smoker.  Past Medical History  Diagnosis Date  . Asthma   . Hypertension   . GERD (gastroesophageal reflux disease)   . Eczema   . Diabetes mellitus without complication [facility])    Past Surgical History  Procedure Laterality Date  . Hernia repair     Family History  Problem Relation Age of Onset  . Cancer Mother   . Cancer Father   . Hypertension Sister   . Heart murmur Sister   . CVA Brother   . CVA Brother   . Hypertension Brother    Social History  Substance Use Topics  . Smoking status: Never Smoker   . Smokeless tobacco: None  . Alcohol Use: Yes    Review of Systems  Constitutional: Negative for fever.  Respiratory: Negative for cough and shortness of breath.   Cardiovascular: Positive for  chest pain.  Gastrointestinal: Negative for nausea, vomiting and abdominal pain.  Neurological: Positive for headaches.  All other systems reviewed and are negative.     Allergies  Review of patient's allergies indicates no known allergies.  Home Medications   Prior to Admission medications   Medication Sig Start Date End Date Taking? Authorizing Provider  amLODipine (NORVASC) 5 MG tablet Take 1 tablet (5 mg total) by mouth daily. 05/10/13   Quintella Reichert, MD  cyclobenzaprine (FLEXERIL) 5 MG tablet Take 5 mg by mouth 3 (three) times daily as needed for muscle spasms.    Historical Provider, MD  fluocinonide cream (LIDEX) 0.05 % Apply 1 application topically 2 (two) times daily.    Historical Provider, MD  hydrochlorothiazide (HYDRODIURIL) 25 MG tablet Take 25 mg by mouth daily.    Historical Provider, MD  insulin glargine (LANTUS) 100 UNIT/ML injection Inject 16 Units into the skin daily.    Historical Provider, MD  metFORMIN (GLUCOPHAGE) 500 MG tablet Take 500 mg by mouth.    Historical Provider, MD  ondansetron (ZOFRAN) 4 MG tablet Take 1 tablet (4 mg total) by mouth every 6 (six) hours. 07/03/12   Glynn Octave, MD  pantoprazole (PROTONIX) 40 MG tablet Take 40 mg by mouth daily.    Historical Provider, MD  pravastatin (PRAVACHOL) 40 MG tablet Take 40 mg by mouth daily.    Historical Provider,  MD  sertraline (ZOLOFT) 100 MG tablet Take 50 mg by mouth daily.    Historical Provider, MD   Triage vitals: BP 157/105 mmHg  Pulse 86  Temp(Src) 98 F (36.7 C) (Oral)  Resp 18  Ht  (1.676 m)  Wt 168 lb (76.204 kg)  BMI 27.13 kg/m2  SpO2 99% Physical Exam  Constitutional: He is oriented to person, place, and time. He appears well-developed and well-nourished. No distress.  HENT:  Head: Normocephalic and atraumatic.  Eyes: Pupils are equal, round, and reactive to light.  Neck: Neck supple.  Cardiovascular: Normal rate, regular rhythm and normal heart sounds.   No murmur  heard. Pulmonary/Chest: Effort normal and breath sounds normal. No respiratory distress. He has no wheezes. He exhibits no tenderness.  Abdominal: Soft. Bowel sounds are normal. There is no tenderness. There is no rebound.  Musculoskeletal: He exhibits no edema.  Neurological: He is alert and oriented to person, place, and time.  Cranial nerves II through XII intact, 5 out of 5 strength in all 4 extremities, no dysmetria to finger-nose-finger  Skin: Skin is warm and dry.  Psychiatric: He has a normal mood and affect.  Nursing note and vitals reviewed.   ED Course  Procedures  DIAGNOSTIC STUDIES: Oxygen Saturation is 99% on RA, normal by my interpretation.  COORDINATION OF CARE:  12:06 AM Discussed treatment plan which includes pain medication and repeat heart test with pt at bedside and pt agreed to plan. Pt advised to follow up with cardiologist for further work up.  Labs Review Labs Reviewed  BASIC METABOLIC PANEL - Abnormal; Notable for the following:    Sodium 134 (*)    Chloride 99 (*)    Glucose, Bld 314 (*)    BUN <5 (*)    All other components within normal limits  CBC - Abnormal; Notable for the following:    WBC 12.9 (*)    All other components within normal limits  I-STAT TROPOININ, ED  Rosezena Sensor, ED    Imaging Review Dg Chest 2 View  10/05/2015  CLINICAL DATA:  Chronic intermittent left-sided chest pain and epigastric pain. Shortness of breath and dizziness. Left hand and arm numbness. Initial encounter. EXAM: CHEST  2 VIEW COMPARISON:  Chest radiograph performed 05/18/2013 FINDINGS: The lungs are well-aerated and clear. There is no evidence of focal opacification, pleural effusion or pneumothorax. The heart is normal in size; the mediastinal contour is within normal limits. No acute osseous abnormalities are seen. IMPRESSION: No acute cardiopulmonary process seen. Electronically Signed   By: Roanna Raider M.D.   On: 10/05/2015 21:59   I have personally  reviewed and evaluated these images and lab results as part of my medical decision-making.   EKG Interpretation None      MDM   Final diagnoses:  Other chest pain  Acute nonintractable headache, unspecified headache type    Patient presents with several complaints including chest pain and shortness of breath, headache. Nontoxic on exam. Initial blood pressure 157/105; however, improved to 120s over 70s while in the ER. Initial EKG is unchanged from prior and troponin 2 is negative. He had a stress test in 2015 which showed no inducible ischemia. I have recommended follow-up with Dr. Mayford Knife given his recurrent pain. His pain is very atypical for ACS; however, he does have risk factors. Headache improved with migraine cocktail. Low suspicion at this time for subarachnoid hemorrhage or meningitis. Given improvement of pressures while in the emergency room, will defer reinstatement  of his blood pressure meds by his primary physician.  After history, exam, and medical workup I feel the patient has been appropriately medically screened and is safe for discharge home. Pertinent diagnoses were discussed with the patient. Patient was given return precautions.  I personally performed the services described in this documentation, which was scribed in my presence. The recorded information has been reviewed and is accurate.   Shon Batonourtney F Horton, MD 10/06/15 [phone]

## 2015-10-05 NOTE — ED Notes (Signed)
Pt. reports intermittent central chest pain with mild SOB , headache and dizziness onset today , denies nausea or diaphoresis .

## 2015-10-06 LAB — I-STAT TROPONIN, ED: TROPONIN I, POC: 0 ng/mL (ref 0.00–0.08)

## 2015-10-06 MED ORDER — ASPIRIN 81 MG PO CHEW
324.0000 mg | CHEWABLE_TABLET | Freq: Once | ORAL | Status: AC
  Administered 2015-10-06: 324 mg via ORAL
  Filled 2015-10-06: qty 4

## 2015-10-06 MED ORDER — SODIUM CHLORIDE 0.9 % IV BOLUS (SEPSIS)
1000.0000 mL | Freq: Once | INTRAVENOUS | Status: AC
  Administered 2015-10-06: 1000 mL via INTRAVENOUS

## 2015-10-06 MED ORDER — PROCHLORPERAZINE EDISYLATE 5 MG/ML IJ SOLN
10.0000 mg | Freq: Once | INTRAMUSCULAR | Status: AC
  Administered 2015-10-06: 10 mg via INTRAVENOUS
  Filled 2015-10-06: qty 2

## 2015-10-06 MED ORDER — MAGNESIUM SULFATE 2 GM/50ML IV SOLN
2.0000 g | Freq: Once | INTRAVENOUS | Status: AC
  Administered 2015-10-06: 2 g via INTRAVENOUS
  Filled 2015-10-06: qty 50

## 2015-10-06 MED ORDER — DIPHENHYDRAMINE HCL 50 MG/ML IJ SOLN
25.0000 mg | Freq: Once | INTRAMUSCULAR | Status: AC
  Administered 2015-10-06: 25 mg via INTRAVENOUS
  Filled 2015-10-06: qty 1

## 2015-10-06 NOTE — ED Notes (Signed)
Pt stable, ambulatory, states understanding of discharge instructions 

## 2015-10-06 NOTE — Discharge Instructions (Signed)
You were seen today for chest pain and headache. Your EKG and heart tests were reassuring. You need follow-up with her cardiologist. Your blood pressure normalized while in the emergency room. I will not reinitiate blood pressure medications until you have recheck with your primary physician. You should restart your cholesterol medication.  Nonspecific Chest Pain  Chest pain can be caused by many different conditions. There is always a chance that your pain could be related to something serious, such as a heart attack or a blood clot in your lungs. Chest pain can also be caused by conditions that are not life-threatening. If you have chest pain, it is very important to follow up with your health care provider. CAUSES  Chest pain can be caused by:  Heartburn.  Pneumonia or bronchitis.  Anxiety or stress.  Inflammation around your heart (pericarditis) or lung (pleuritis or pleurisy).  A blood clot in your lung.  A collapsed lung (pneumothorax). It can develop suddenly on its own (spontaneous pneumothorax) or from trauma to the chest.  Shingles infection (varicella-zoster virus).  Heart attack.  Damage to the bones, muscles, and cartilage that make up your chest wall. This can include:  Bruised bones due to injury.  Strained muscles or cartilage due to frequent or repeated coughing or overwork.  Fracture to one or more ribs.  Sore cartilage due to inflammation (costochondritis). RISK FACTORS  Risk factors for chest pain may include:  Activities that increase your risk for trauma or injury to your chest.  Respiratory infections or conditions that cause frequent coughing.  Medical conditions or overeating that can cause heartburn.  Heart disease or family history of heart disease.  Conditions or health behaviors that increase your risk of developing a blood clot.  Having had chicken pox (varicella zoster). SIGNS AND SYMPTOMS Chest pain can feel like:  Burning or tingling  on the surface of your chest or deep in your chest.  Crushing, pressure, aching, or squeezing pain.  Dull or sharp pain that is worse when you move, cough, or take a deep breath.  Pain that is also felt in your back, neck, shoulder, or arm, or pain that spreads to any of these areas. Your chest pain may come and go, or it may stay constant. DIAGNOSIS Lab tests or other studies may be needed to find the cause of your pain. Your health care provider may have you take a test called an ambulatory ECG (electrocardiogram). An ECG records your heartbeat patterns at the time the test is performed. You may also have other tests, such as:  Transthoracic echocardiogram (TTE). During echocardiography, sound waves are used to create a picture of all of the heart structures and to look at how blood flows through your heart.  Transesophageal echocardiogram (TEE).This is a more advanced imaging test that obtains images from inside your body. It allows your health care provider to see your heart in finer detail.  Cardiac monitoring. This allows your health care provider to monitor your heart rate and rhythm in real time.  Holter monitor. This is a portable device that records your heartbeat and can help to diagnose abnormal heartbeats. It allows your health care provider to track your heart activity for several days, if needed.  Stress tests. These can be done through exercise or by taking medicine that makes your heart beat more quickly.  Blood tests.  Imaging tests. TREATMENT  Your treatment depends on what is causing your chest pain. Treatment may include:  Medicines. These may include:  Acid blockers for heartburn.  Anti-inflammatory medicine.  Pain medicine for inflammatory conditions.  Antibiotic medicine, if an infection is present.  Medicines to dissolve blood clots.  Medicines to treat coronary artery disease.  Supportive care for conditions that do not require medicines. This may  include:  Resting.  Applying heat or cold packs to injured areas.  Limiting activities until pain decreases. HOME CARE INSTRUCTIONS  If you were prescribed an antibiotic medicine, finish it all even if you start to feel better.  Avoid any activities that bring on chest pain.  Do not use any tobacco products, including cigarettes, chewing tobacco, or electronic cigarettes. If you need help quitting, ask your health care provider.  Do not drink alcohol.  Take medicines only as directed by your health care provider.  Keep all follow-up visits as directed by your health care provider. This is important. This includes any further testing if your chest pain does not go away.  If heartburn is the cause for your chest pain, you may be told to keep your head raised (elevated) while sleeping. This reduces the chance that acid will go from your stomach into your esophagus.  Make lifestyle changes as directed by your health care provider. These may include:  Getting regular exercise. Ask your health care provider to suggest some activities that are safe for you.  Eating a heart-healthy diet. A registered dietitian can help you to learn healthy eating options.  Maintaining a healthy weight.  Managing diabetes, if necessary.  Reducing stress. SEEK MEDICAL CARE IF:  Your chest pain does not go away after treatment.  You have a rash with blisters on your chest.  You have a fever. SEEK IMMEDIATE MEDICAL CARE IF:   Your chest pain is worse.  You have an increasing cough, or you cough up blood.  You have severe abdominal pain.  You have severe weakness.  You faint.  You have chills.  You have sudden, unexplained chest discomfort.  You have sudden, unexplained discomfort in your arms, back, neck, or jaw.  You have shortness of breath at any time.  You suddenly start to sweat, or your skin gets clammy.  You feel nauseous or you vomit.  You suddenly feel light-headed or  dizzy.  Your heart begins to beat quickly, or it feels like it is skipping beats. These symptoms may represent a serious problem that is an emergency. Do not wait to see if the symptoms will go away. Get medical help right away. Call your local emergency services (911 in the [place] Do not drive yourself to the hospital.   This information is not intended to replace advice given to you by your health care provider. Make sure you discuss any questions you have with your health care provider.   Document Released: 01/20/2005 Document Revised: 05/03/2014 Document Reviewed: 11/16/2013 Elsevier Interactive Patient Education [employer].

## 2016-11-08 ENCOUNTER — Ambulatory Visit: Payer: BLUE CROSS/BLUE SHIELD | Admitting: Registered"

## 2016-11-25 ENCOUNTER — Ambulatory Visit: Payer: BLUE CROSS/BLUE SHIELD | Admitting: Registered"

## 2017-08-15 ENCOUNTER — Encounter (HOSPITAL_COMMUNITY): Payer: Self-pay | Admitting: Emergency Medicine

## 2017-08-15 ENCOUNTER — Emergency Department (HOSPITAL_COMMUNITY)
Admission: EM | Admit: 2017-08-15 | Discharge: 2017-08-16 | Disposition: A | Discharge status: Home / Self Care | Payer: Self-pay | Attending: Emergency Medicine | Admitting: Emergency Medicine

## 2017-08-15 ENCOUNTER — Emergency Department (HOSPITAL_COMMUNITY): Payer: Self-pay

## 2017-08-15 ENCOUNTER — Other Ambulatory Visit: Payer: Self-pay

## 2017-08-15 DIAGNOSIS — Z5321 Procedure and treatment not carried out due to patient leaving prior to being seen by health care provider: Secondary | ICD-10-CM | POA: Insufficient documentation

## 2017-08-15 DIAGNOSIS — I10 Essential (primary) hypertension: Secondary | ICD-10-CM | POA: Insufficient documentation

## 2017-08-15 DIAGNOSIS — R202 Paresthesia of skin: Secondary | ICD-10-CM | POA: Insufficient documentation

## 2017-08-15 LAB — DIFFERENTIAL
Basophils Absolute: 0 10*3/uL (ref 0.0–0.1)
Basophils Relative: 0 %
Eosinophils Absolute: 0.1 10*3/uL (ref 0.0–0.7)
Eosinophils Relative: 1 %
Lymphocytes Relative: 28 %
Lymphs Abs: 3.5 10*3/uL (ref 0.7–4.0)
Monocytes Absolute: 0.9 10*3/uL (ref 0.1–1.0)
Monocytes Relative: 7 %
Neutro Abs: 8.2 10*3/uL — ABNORMAL HIGH (ref 1.7–7.7)
Neutrophils Relative %: 64 %

## 2017-08-15 LAB — CBC
HCT: 40 % (ref 39.0–52.0)
Hemoglobin: 13.3 g/dL (ref 13.0–17.0)
MCH: 30.2 pg (ref 26.0–34.0)
MCHC: 33.3 g/dL (ref 30.0–36.0)
MCV: 90.7 fL (ref 78.0–100.0)
Platelets: 364 10*3/uL (ref 150–400)
RBC: 4.41 MIL/uL (ref 4.22–5.81)
RDW: 13.2 % (ref 11.5–15.5)
WBC: 12.7 10*3/uL — ABNORMAL HIGH (ref 4.0–10.5)

## 2017-08-15 LAB — COMPREHENSIVE METABOLIC PANEL
ALT: 15 U/L — ABNORMAL LOW (ref 17–63)
AST: 19 U/L (ref 15–41)
Albumin: 3.4 g/dL — ABNORMAL LOW (ref 3.5–5.0)
Alkaline Phosphatase: 94 U/L (ref 38–126)
Anion gap: 11 (ref 5–15)
BUN: 9 mg/dL (ref 6–20)
CO2: 22 mmol/L (ref 22–32)
Calcium: 8.7 mg/dL — ABNORMAL LOW (ref 8.9–10.3)
Chloride: 106 mmol/L (ref 101–111)
Creatinine, Ser: 0.95 mg/dL (ref 0.61–1.24)
GFR calc Af Amer: >60 mL/min (ref >60)
GFR calc non Af Amer: >60 mL/min (ref >60)
Glucose, Bld: 121 mg/dL — ABNORMAL HIGH (ref 65–99)
Potassium: 3.4 mmol/L — ABNORMAL LOW (ref 3.5–5.1)
Sodium: 139 mmol/L (ref 135–145)
Total Bilirubin: 0.8 mg/dL (ref 0.3–1.2)
Total Protein: 7.2 g/dL (ref 6.5–8.1)

## 2017-08-15 LAB — APTT: aPTT: 33 seconds (ref 24–36)

## 2017-08-15 LAB — PROTIME-INR
INR: 1.07
Prothrombin Time: 13.8 seconds (ref 11.4–15.2)
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
## 2017-08-16 NOTE — ED Notes (Signed)
Pt. Called for vitals reassessment... No answer x3. Pulling OTF. 

## 2018-07-11 ENCOUNTER — Other Ambulatory Visit: Payer: Self-pay

## 2018-07-11 ENCOUNTER — Ambulatory Visit (HOSPITAL_COMMUNITY)
Admission: EM | Admit: 2018-07-11 | Discharge: 2018-07-11 | Disposition: A | Discharge status: Home / Self Care | Payer: Self-pay | Attending: Emergency Medicine | Admitting: Emergency Medicine

## 2018-07-11 ENCOUNTER — Encounter (HOSPITAL_COMMUNITY): Payer: Self-pay | Admitting: Emergency Medicine

## 2018-07-11 DIAGNOSIS — Z0489 Encounter for examination and observation for other specified reasons: Secondary | ICD-10-CM

## 2018-07-11 DIAGNOSIS — Z0000 Encounter for general adult medical examination without abnormal findings: Secondary | ICD-10-CM

## 2018-07-11 DIAGNOSIS — I10 Essential (primary) hypertension: Secondary | ICD-10-CM

## 2018-07-11 NOTE — Discharge Instructions (Addendum)
Will need to follow up with pcp about blood pressure if high

## 2018-07-11 NOTE — ED Provider Notes (Signed)
[facility]    CSN: [id number] Arrival date & time: 07/11/18  1422     History   Chief Complaint Chief Complaint  Patient presents with  . Letter for School/Work    HPI Peter Bell is a 48 y.o. male.   Pt states that he felt like he had a fever 3 days ago and his employer states that he needs to be fever free for 24 hours to return with a note. Pt states at this time he feels fine. No fever for 24 hours. No n/v/d no cough no complaints.      Past Medical History:  Diagnosis Date  . Asthma   . Diabetes mellitus without complication (HCC)   . Eczema   . GERD (gastroesophageal reflux disease)   . Hypertension     Patient Active Problem List   Diagnosis Date Noted  . SOB (shortness of breath) 05/10/2013  . Essential hypertension, benign 04/24/2013  . Chest pain, unspecified 04/24/2013    Past Surgical History:  Procedure Laterality Date  . HERNIA REPAIR         Home Medications    Prior to Admission medications   Medication Sig Start Date End Date Taking? Authorizing Provider  amLODipine (NORVASC) 5 MG tablet Take 1 tablet (5 mg total) by mouth daily. 05/10/13   Quintella Reichert, MD  cyclobenzaprine (FLEXERIL) 5 MG tablet Take 5 mg by mouth 3 (three) times daily as needed for muscle spasms.    [provider]  fluocinonide cream (LIDEX) 0.05 % Apply 1 application topically 2 (two) times daily.    [provider]  hydrochlorothiazide (HYDRODIURIL) 25 MG tablet Take 25 mg by mouth daily.    [provider]  insulin glargine (LANTUS) 100 UNIT/ML injection Inject 16 Units into the skin daily.    [provider]  metFORMIN (GLUCOPHAGE) 500 MG tablet Take 500 mg by mouth.    [provider]  ondansetron (ZOFRAN) 4 MG tablet Take 1 tablet (4 mg total) by mouth every 6 (six) hours. 07/03/12   Rancour, Jeannett Senior, MD  pantoprazole (PROTONIX) 40 MG tablet Take 40 mg by mouth daily.    [provider]   pravastatin (PRAVACHOL) 40 MG tablet Take 40 mg by mouth daily.    [provider]  sertraline (ZOLOFT) 100 MG tablet Take 50 mg by mouth daily.    [provider]    Family History Family History  Problem Relation Age of Onset  . Cancer Mother   . Cancer Father   . Hypertension Sister   . Heart murmur Sister   . CVA Brother   . CVA Brother   . Hypertension Brother     Social History Social History   Tobacco Use  . Smoking status: Never Smoker  . Smokeless tobacco: Never Used  Substance Use Topics  . Alcohol use: Yes    Comment: occ, on weekends  . Drug use: No     Allergies   Patient has no known allergies.   Review of Systems Review of Systems  Constitutional: Negative.   Respiratory: Negative.   Cardiovascular: Negative.   Gastrointestinal: Negative.   Genitourinary: Negative.   Musculoskeletal: Negative.   Skin: Negative.   Neurological: Negative.      Physical Exam Triage Vital Signs ED Triage Vitals [07/11/18 1453]  Enc Vitals Group     BP (!) 195/97     Pulse Rate 84     Resp 18  Temp 98.3 F (36.8 C)     Temp Source Temporal     SpO2 98 %     Weight      Height      Head Circumference      Peak Flow      Pain Score 0     Pain Loc      Pain Edu?      Excl. in GC?    No data found.  Updated Vital Signs BP (!) 195/97 (BP Location: Right Arm)   Pulse 84   Temp 98.3 F (36.8 C) (Temporal)   Resp 18   SpO2 98%   Visual Acuity Right Eye Distance:   Left Eye Distance:   Bilateral Distance:    Right Eye Near:   Left Eye Near:    Bilateral Near:     Physical Exam Cardiovascular:     Rate and Rhythm: Normal rate.  Pulmonary:     Effort: Pulmonary effort is normal.  Abdominal:     General: Abdomen is flat.  Skin:    General: Skin is warm.  Neurological:     General: No focal deficit present.     Mental Status: He is alert.      UC Treatments / Results  Labs (all labs ordered are listed, but only  abnormal results are displayed) Labs Reviewed - No data to display  EKG None  Radiology No results found.  Procedures Procedures (including critical care time)  Medications Ordered in UC Medications - No data to display  Initial Impression / Assessment and Plan / UC Course  I have reviewed the triage vital signs and the nursing notes.  Pertinent labs & imaging results that were available during my care of the patient were reviewed by me and considered in my medical decision making (see chart for details).    Pt feels fine just needs a work note Denies any complaints Expressed to pt if he has any problems to return    Final Clinical Impressions(s) / UC Diagnoses   Final diagnoses:  None   Discharge Instructions   None    ED Prescriptions    None     Controlled Substance Prescriptions [place] Controlled Substance Registry consulted? Not Applicable   Coralyn Mark, NP 07/11/18 1523

## 2018-07-11 NOTE — ED Triage Notes (Signed)
Pt sts had fever this weekend and now needs work note to return

## 2018-10-12 ENCOUNTER — Encounter (HOSPITAL_COMMUNITY): Payer: Self-pay | Admitting: Emergency Medicine

## 2018-10-12 ENCOUNTER — Ambulatory Visit (HOSPITAL_COMMUNITY)
Admission: EM | Admit: 2018-10-12 | Discharge: 2018-10-12 | Disposition: A | Discharge status: Home / Self Care | Payer: Self-pay | Attending: Family Medicine | Admitting: Family Medicine

## 2018-10-12 ENCOUNTER — Other Ambulatory Visit: Payer: Self-pay

## 2018-10-12 DIAGNOSIS — Z9114 Patient's other noncompliance with medication regimen: Secondary | ICD-10-CM

## 2018-10-12 DIAGNOSIS — R42 Dizziness and giddiness: Secondary | ICD-10-CM

## 2018-10-12 DIAGNOSIS — I10 Essential (primary) hypertension: Secondary | ICD-10-CM

## 2018-10-12 MED ORDER — MECLIZINE HCL 25 MG PO TABS: 12.5000 mg | ORAL_TABLET | Freq: Three times a day (TID) | ORAL | 0 refills | Status: DC | PRN

## 2018-10-12 MED ORDER — AMLODIPINE BESYLATE 10 MG PO TABS: 5.0000 mg | ORAL_TABLET | Freq: Every day | ORAL | 1 refills | Status: DC

## 2018-10-12 NOTE — ED Triage Notes (Signed)
Pt here for dizziness when laying down and upon position change x 3 days with room spinning; pt out of htn meds for some time

## 2018-10-12 NOTE — Discharge Instructions (Addendum)
Drink plenty of fluids Take meclizine as needed for dizziness. Go back on your blood pressure medication.  Take 5 mg daily. Follow-up with your primary care as soon as you are able

## 2018-10-12 NOTE — ED Provider Notes (Signed)
[facility]    CSN: [id number] Arrival date & time: 10/12/18  1156      History   Chief Complaint Chief Complaint  Patient presents with  . Dizziness    HPI Peter Bell is a 48 y.o. male.   HPI  Vertigo for 3 d A spinning sensation if lays on back Has slept in recliner last 2 nights to prevent Nausea but no vomiting Lasts about 20 sec No head injury No congestion or recent infection Hearing normal  Is out of BP medicine Lost insurance and could not afford Given a Rx for [facility]  Past Medical History:  Diagnosis Date  . Asthma   . Diabetes mellitus without complication (HCC)   . Eczema   . GERD (gastroesophageal reflux disease)   . Hypertension     Patient Active Problem List   Diagnosis Date Noted  . SOB (shortness of breath) 05/10/2013  . Essential hypertension, benign 04/24/2013  . Chest pain, unspecified 04/24/2013    Past Surgical History:  Procedure Laterality Date  . HERNIA REPAIR         Home Medications    Prior to Admission medications   Medication Sig Start Date End Date Taking? Authorizing Provider  amLODipine (NORVASC) 10 MG tablet Take 0.5 tablets (5 mg total) by mouth daily. 10/12/18   Eustace MooreNelson,  Sue, MD  cyclobenzaprine (FLEXERIL) 5 MG tablet Take 5 mg by mouth 3 (three) times daily as needed for muscle spasms.    [provider]  fluocinonide cream (LIDEX) 0.05 % Apply 1 application topically 2 (two) times daily.    [provider]  hydrochlorothiazide (HYDRODIURIL) 25 MG tablet Take 25 mg by mouth daily.    [provider]  insulin glargine (LANTUS) 100 UNIT/ML injection Inject 16 Units into the skin daily.    [provider]  meclizine (ANTIVERT) 25 MG tablet Take 0.5-1 tablets (12.5-25 mg total) by mouth 3 (three) times daily as needed for dizziness. 10/12/18   Eustace MooreNelson,  Sue, MD  metFORMIN (GLUCOPHAGE) 500 MG tablet Take 500 mg by mouth.    [provider]   ondansetron (ZOFRAN) 4 MG tablet Take 1 tablet (4 mg total) by mouth every 6 (six) hours. 07/03/12   Rancour, Jeannett SeniorStephen, MD  pantoprazole (PROTONIX) 40 MG tablet Take 40 mg by mouth daily.    [provider]  pravastatin (PRAVACHOL) 40 MG tablet Take 40 mg by mouth daily.    [provider]  sertraline (ZOLOFT) 100 MG tablet Take 50 mg by mouth daily.    [provider]    Family History Family History  Problem Relation Age of Onset  . Cancer Mother   . Cancer Father   . Hypertension Sister   . Heart murmur Sister   . CVA Brother   . CVA Brother   . Hypertension Brother     Social History Social History   Tobacco Use  . Smoking status: Never Smoker  . Smokeless tobacco: Never Used  Substance Use Topics  . Alcohol use: Yes    Comment: occ, on weekends  . Drug use: No     Allergies   Patient has no known allergies.   Review of Systems Review of Systems  Constitutional: Negative for chills and fever.  HENT: Negative for ear pain and sore throat.   Eyes: Negative for pain and visual disturbance.  Respiratory: Negative for cough and shortness of breath.   Cardiovascular: Negative for chest pain and palpitations.  Gastrointestinal: Positive for nausea. Negative for abdominal pain and vomiting.  Genitourinary: Negative for dysuria and hematuria.  Musculoskeletal: Negative for arthralgias and back pain.  Skin: Negative for color change and rash.  Neurological: Positive for dizziness. Negative for seizures, syncope and headaches.  All other systems reviewed and are negative.    Physical Exam Triage Vital Signs ED Triage Vitals [10/12/18 1223]  Enc Vitals Group     BP (!) 199/96     Pulse Rate 72     Resp 18     Temp 98.3 F (36.8 C)     Temp Source Oral     SpO2 100 %     Weight      Height      Head Circumference      Peak Flow      Pain Score 0     Pain Loc      Pain Edu?      Excl. in GC?    No data found.  Updated Vital  Signs BP (!) 199/96 (BP Location: Right Arm)   Pulse 72   Temp 98.3 F (36.8 C) (Oral)   Resp 18   SpO2 100%      Physical Exam Constitutional:      General: He is not in acute distress.    Appearance: He is well-developed and normal weight.  HENT:     Head: Normocephalic and atraumatic.     Right Ear: Tympanic membrane, ear canal and external ear normal.     Left Ear: Tympanic membrane, ear canal and external ear normal.     Nose: Nose normal. No congestion.     Mouth/Throat:     Mouth: Mucous membranes are moist.     Comments: Many absent teeth Eyes:     Conjunctiva/sclera: Conjunctivae normal.     Pupils: Pupils are equal, round, and reactive to light.     Comments: Fundi neg.  +nystagmus R lateral gaze  Neck:     Musculoskeletal: Normal range of motion.  Cardiovascular:     Rate and Rhythm: Normal rate.  Pulmonary:     Effort: Pulmonary effort is normal. No respiratory distress.  Abdominal:     General: There is no distension.     Palpations: Abdomen is soft.  Musculoskeletal: Normal range of motion.  Skin:    General: Skin is warm and dry.  Neurological:     General: No focal deficit present.     Mental Status: He is alert.     Cranial Nerves: No cranial nerve deficit.     Motor: No weakness.     Coordination: Coordination normal.     Gait: Gait normal.     Deep Tendon Reflexes: Reflexes normal.  Psychiatric:        Mood and Affect: Mood normal.        Behavior: Behavior normal.      UC Treatments / Results  Labs (all labs ordered are listed, but only abnormal results are displayed) Labs Reviewed - No data to display  EKG None  Radiology No results found.  Procedures Procedures (including critical care time)  Medications Ordered in UC Medications - No data to display  Initial Impression / Assessment and Plan / UC Course  I have reviewed the triage vital signs and the nursing notes.  Pertinent labs & imaging results that were available  during my care of the patient were reviewed by me and considered in my medical decision making (see chart for details).  Final Clinical Impressions(s) / UC Diagnoses   Final diagnoses:  Vertigo  Essential hypertension  Noncompliance with medications     Discharge Instructions     Drink plenty of fluids Take meclizine as needed for dizziness. Go back on your blood pressure medication.  Take 5 mg daily. Follow-up with your primary care as soon as you are able    ED Prescriptions    Medication Sig Dispense Auth. Provider   amLODipine (NORVASC) 10 MG tablet Take 0.5 tablets (5 mg total) by mouth daily. 30 tablet Raylene Everts, MD   meclizine (ANTIVERT) 25 MG tablet Take 0.5-1 tablets (12.5-25 mg total) by mouth 3 (three) times daily as needed for dizziness. 15 tablet Raylene Everts, MD     Controlled Substance Prescriptions [place] Controlled Substance Registry consulted? Not Applicable   Raylene Everts, MD 10/12/18 1304

## 2019-03-15 ENCOUNTER — Emergency Department (HOSPITAL_BASED_OUTPATIENT_CLINIC_OR_DEPARTMENT_OTHER): Payer: Worker's Compensation

## 2019-03-15 ENCOUNTER — Emergency Department (HOSPITAL_BASED_OUTPATIENT_CLINIC_OR_DEPARTMENT_OTHER)
Admission: EM | Admit: 2019-03-15 | Discharge: 2019-03-15 | Disposition: A | Discharge status: Home / Self Care | Payer: Worker's Compensation | Attending: Emergency Medicine | Admitting: Emergency Medicine

## 2019-03-15 ENCOUNTER — Other Ambulatory Visit: Payer: Self-pay

## 2019-03-15 DIAGNOSIS — Y92410 Unspecified street and highway as the place of occurrence of the external cause: Secondary | ICD-10-CM | POA: Insufficient documentation

## 2019-03-15 DIAGNOSIS — E119 Type 2 diabetes mellitus without complications: Secondary | ICD-10-CM | POA: Diagnosis not present

## 2019-03-15 DIAGNOSIS — R55 Syncope and collapse: Secondary | ICD-10-CM | POA: Insufficient documentation

## 2019-03-15 DIAGNOSIS — Z794 Long term (current) use of insulin: Secondary | ICD-10-CM | POA: Insufficient documentation

## 2019-03-15 DIAGNOSIS — S161XXA Strain of muscle, fascia and tendon at neck level, initial encounter: Secondary | ICD-10-CM | POA: Insufficient documentation

## 2019-03-15 DIAGNOSIS — Y999 Unspecified external cause status: Secondary | ICD-10-CM | POA: Insufficient documentation

## 2019-03-15 DIAGNOSIS — Z79899 Other long term (current) drug therapy: Secondary | ICD-10-CM | POA: Diagnosis not present

## 2019-03-15 DIAGNOSIS — S20219A Contusion of unspecified front wall of thorax, initial encounter: Secondary | ICD-10-CM | POA: Diagnosis not present

## 2019-03-15 DIAGNOSIS — I10 Essential (primary) hypertension: Secondary | ICD-10-CM | POA: Diagnosis not present

## 2019-03-15 DIAGNOSIS — Y9389 Activity, other specified: Secondary | ICD-10-CM | POA: Diagnosis not present

## 2019-03-15 DIAGNOSIS — S1980XA Other specified injuries of unspecified part of neck, initial encounter: Secondary | ICD-10-CM | POA: Diagnosis present

## 2019-03-15 DIAGNOSIS — S20212A Contusion of left front wall of thorax, initial encounter: Secondary | ICD-10-CM

## 2019-03-15 DIAGNOSIS — J45909 Unspecified asthma, uncomplicated: Secondary | ICD-10-CM | POA: Diagnosis not present

## 2019-03-15 MED ORDER — IBUPROFEN 600 MG PO TABS: 600.0000 mg | ORAL_TABLET | Freq: Four times a day (QID) | ORAL | 0 refills | Status: DC | PRN

## 2019-03-15 MED ORDER — CYCLOBENZAPRINE HCL 10 MG PO TABS: 10.0000 mg | ORAL_TABLET | Freq: Two times a day (BID) | ORAL | 0 refills | Status: DC | PRN

## 2019-03-15 MED ORDER — ACETAMINOPHEN 500 MG PO TABS
1000.0000 mg | ORAL_TABLET | Freq: Once | ORAL | Status: AC
  Administered 2019-03-15: 1000 mg via ORAL
  Filled 2019-03-15: qty 2

## 2019-03-15 NOTE — Discharge Instructions (Addendum)
Follow-up with your primary care physician if your symptoms are not improving.  Return here as needed for any worsening symptoms.  You need to have your blood pressure rechecked by your primary care physician.
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
## 2019-03-15 NOTE — ED Notes (Signed)
Patient transported to CT 

## 2019-03-17 ENCOUNTER — Ambulatory Visit (HOSPITAL_COMMUNITY)
Admission: EM | Admit: 2019-03-17 | Discharge: 2019-03-17 | Disposition: A | Discharge status: Home / Self Care | Payer: Self-pay | Attending: Family Medicine | Admitting: Family Medicine

## 2019-03-17 ENCOUNTER — Encounter (HOSPITAL_COMMUNITY): Payer: Self-pay

## 2019-03-17 ENCOUNTER — Other Ambulatory Visit: Payer: Self-pay

## 2019-03-17 DIAGNOSIS — M25512 Pain in left shoulder: Secondary | ICD-10-CM

## 2019-03-17 DIAGNOSIS — M545 Low back pain, unspecified: Secondary | ICD-10-CM

## 2019-03-17 MED ORDER — PREDNISONE 5 MG PO TABS: ORAL_TABLET | ORAL | 0 refills | Status: DC
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
## 2019-03-17 NOTE — Discharge Instructions (Signed)
Please try the medicine  Please try heat on the lower back  Please try the exercises  Please follow up if your symptoms fail to improve.
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
## 2019-03-19 ENCOUNTER — Other Ambulatory Visit: Payer: Self-pay

## 2019-03-19 ENCOUNTER — Ambulatory Visit (HOSPITAL_COMMUNITY)
Admission: EM | Admit: 2019-03-19 | Discharge: 2019-03-19 | Disposition: A | Discharge status: Home / Self Care | Payer: Self-pay | Attending: Family Medicine | Admitting: Family Medicine

## 2019-03-19 ENCOUNTER — Encounter (HOSPITAL_COMMUNITY): Payer: Self-pay

## 2019-03-19 DIAGNOSIS — S161XXD Strain of muscle, fascia and tendon at neck level, subsequent encounter: Secondary | ICD-10-CM

## 2019-03-19 DIAGNOSIS — S39012D Strain of muscle, fascia and tendon of lower back, subsequent encounter: Secondary | ICD-10-CM

## 2019-03-19 MED ORDER — MELOXICAM 7.5 MG PO TABS: 7.5000 mg | ORAL_TABLET | Freq: Every day | ORAL | 0 refills | Status: DC
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
## 2019-03-19 NOTE — Discharge Instructions (Signed)
Please follow up with your primary care provider.  Please complete course of prednisone as previously prescribed.  Once this is completed you may use meloxicam daily as needed for pain. Don't take ibuprofen while taking this, however. And take with food.  You may take half of the muscle relaxer if needed if causes too much drowsiness.  Light and regular activity and stretching to help with muscular pain.
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
## 2019-04-14 ENCOUNTER — Other Ambulatory Visit: Payer: Self-pay

## 2019-04-14 ENCOUNTER — Encounter (HOSPITAL_COMMUNITY): Payer: Self-pay | Admitting: Emergency Medicine

## 2019-04-14 ENCOUNTER — Ambulatory Visit (HOSPITAL_COMMUNITY)
Admission: EM | Admit: 2019-04-14 | Discharge: 2019-04-14 | Disposition: A | Discharge status: Home / Self Care | Payer: Worker's Compensation | Attending: Family Medicine | Admitting: Family Medicine

## 2019-04-14 DIAGNOSIS — M25562 Pain in left knee: Secondary | ICD-10-CM | POA: Diagnosis not present

## 2019-04-14 DIAGNOSIS — M545 Low back pain, unspecified: Secondary | ICD-10-CM

## 2019-04-14 NOTE — ED Triage Notes (Signed)
Patient reports left knee pain that he feels is related to back pain.
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
## 2019-04-14 NOTE — Discharge Instructions (Addendum)
Please follow-up with your Worker's Comp. agent on Monday to get into physical therapy as planned.  Continue your blood pressure medicine as directed.

## 2019-05-01 ENCOUNTER — Ambulatory Visit (INDEPENDENT_AMBULATORY_CARE_PROVIDER_SITE_OTHER): Payer: Self-pay

## 2019-05-01 ENCOUNTER — Ambulatory Visit (INDEPENDENT_AMBULATORY_CARE_PROVIDER_SITE_OTHER): Payer: Self-pay | Admitting: Orthopaedic Surgery

## 2019-05-01 ENCOUNTER — Encounter: Payer: Self-pay | Admitting: Orthopaedic Surgery

## 2019-05-01 ENCOUNTER — Other Ambulatory Visit: Payer: Self-pay

## 2019-05-01 VITALS — Ht 66.0 in | Wt 135.0 lb

## 2019-05-01 DIAGNOSIS — M542 Cervicalgia: Secondary | ICD-10-CM

## 2019-05-01 DIAGNOSIS — M545 Low back pain, unspecified: Secondary | ICD-10-CM

## 2019-05-01 MED ORDER — DIAZEPAM 5 MG PO TABS: ORAL_TABLET | ORAL | 0 refills | Status: DC

## 2019-05-02 ENCOUNTER — Ambulatory Visit: Payer: Self-pay | Admitting: Orthopaedic Surgery
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
## 2019-05-14 ENCOUNTER — Telehealth: Payer: Self-pay | Admitting: Radiology

## 2019-05-14 NOTE — Telephone Encounter (Signed)
I left voicemail for patient to call and schedule follow up appt with Dr. Ophelia Charter to review MRI x 2.

## 2019-05-29 ENCOUNTER — Ambulatory Visit (INDEPENDENT_AMBULATORY_CARE_PROVIDER_SITE_OTHER): Payer: Worker's Compensation | Admitting: Orthopaedic Surgery

## 2019-05-29 ENCOUNTER — Encounter: Payer: Self-pay | Admitting: Orthopaedic Surgery

## 2019-05-29 ENCOUNTER — Other Ambulatory Visit: Payer: Self-pay

## 2019-05-29 DIAGNOSIS — M5126 Other intervertebral disc displacement, lumbar region: Secondary | ICD-10-CM | POA: Insufficient documentation

## 2019-05-29 MED ORDER — IBUPROFEN 800 MG PO TABS: 800.0000 mg | ORAL_TABLET | Freq: Two times a day (BID) | ORAL | 3 refills | Status: DC
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
## 2019-07-06 ENCOUNTER — Ambulatory Visit: Payer: Self-pay | Admitting: Orthopaedic Surgery

## 2019-07-11 ENCOUNTER — Ambulatory Visit: Payer: Self-pay | Admitting: Orthopaedic Surgery

## 2019-07-31 ENCOUNTER — Encounter: Payer: Self-pay | Admitting: Orthopaedic Surgery

## 2019-07-31 ENCOUNTER — Other Ambulatory Visit: Payer: Self-pay

## 2019-07-31 ENCOUNTER — Ambulatory Visit (INDEPENDENT_AMBULATORY_CARE_PROVIDER_SITE_OTHER): Payer: Worker's Compensation | Admitting: Orthopaedic Surgery

## 2019-07-31 VITALS — BP 177/105 | Ht 66.0 in | Wt 140.0 lb

## 2019-07-31 DIAGNOSIS — M5126 Other intervertebral disc displacement, lumbar region: Secondary | ICD-10-CM | POA: Diagnosis not present
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
## 2019-08-06 ENCOUNTER — Telehealth: Payer: Self-pay

## 2019-08-06 NOTE — Telephone Encounter (Signed)
Eulis Foster, patient's case manager called. He needed last office note faxed to him. I faxed this to [phone].

## 2019-08-08 ENCOUNTER — Other Ambulatory Visit: Payer: Self-pay

## 2019-08-08 ENCOUNTER — Emergency Department (HOSPITAL_COMMUNITY): Payer: BC Managed Care – PPO

## 2019-08-08 ENCOUNTER — Emergency Department (HOSPITAL_COMMUNITY)
Admission: EM | Admit: 2019-08-08 | Discharge: 2019-08-08 | Disposition: A | Discharge status: Home / Self Care | Payer: BC Managed Care – PPO | Attending: Emergency Medicine | Admitting: Emergency Medicine

## 2019-08-08 ENCOUNTER — Ambulatory Visit (HOSPITAL_COMMUNITY)
Admission: EM | Admit: 2019-08-08 | Discharge: 2019-08-08 | Discharge status: Against Medical Advice | Payer: BC Managed Care – PPO | Source: Home / Self Care

## 2019-08-08 ENCOUNTER — Encounter (HOSPITAL_COMMUNITY): Payer: Self-pay

## 2019-08-08 DIAGNOSIS — M25471 Effusion, right ankle: Secondary | ICD-10-CM | POA: Insufficient documentation

## 2019-08-08 DIAGNOSIS — Z794 Long term (current) use of insulin: Secondary | ICD-10-CM | POA: Insufficient documentation

## 2019-08-08 DIAGNOSIS — I10 Essential (primary) hypertension: Secondary | ICD-10-CM | POA: Diagnosis not present

## 2019-08-08 DIAGNOSIS — E119 Type 2 diabetes mellitus without complications: Secondary | ICD-10-CM | POA: Insufficient documentation

## 2019-08-08 DIAGNOSIS — J45909 Unspecified asthma, uncomplicated: Secondary | ICD-10-CM | POA: Diagnosis not present

## 2019-08-08 MED ORDER — IBUPROFEN 800 MG PO TABS: 800.0000 mg | ORAL_TABLET | Freq: Three times a day (TID) | ORAL | 0 refills | Status: DC | PRN

## 2019-08-08 NOTE — ED Triage Notes (Signed)
Pt reports waking up with left ankle swelling, denies injury/trauma. Pt ambulatory.

## 2019-08-08 NOTE — Discharge Instructions (Addendum)
Ice and elevate your ankle.  Return here as needed.  Follow-up with your primary doctor.

## 2019-08-08 NOTE — ED Notes (Signed)
Patient verbalizes understanding of discharge instructions. Opportunity for questioning and answers were provided. Armband removed by staff, pt discharged from ED.  

## 2019-08-08 NOTE — ED Notes (Signed)
[facility]

## 2019-08-12 NOTE — ED Provider Notes (Signed)
[facility] DEPART[facility] Provider Note   CSN: [id number] Arrival date & time: 08/08/19  1038     History Chief Complaint  Patient presents with  . Leg Swelling    Peter Bell is a 49 y.o. male.  HPI Patient presents to the emergency department with left ankle swelling that started when he awoke this morning.  Patient denies any injuries.  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  Patient states he does not recall any specific injuries however.  The patient states there is no pain with this ankle swelling.  Patient does not have any calf swelling or leg swelling otherwise.  The patient does not have any swelling in the right leg.  Patient states that nothing seems to make the condition better or worse.  Patient denies any fevers, nausea, vomiting, back pain, weakness, dizziness, numbness or syncope.    Past Medical History:  Diagnosis Date  . Asthma   . Diabetes mellitus without complication ([facility])   . Eczema   . GERD (gastroesophageal reflux disease)   . Hypertension     Patient Active Problem List   Diagnosis Date Noted  . Protrusion of lumbar intervertebral disc 05/29/2019  . SOB (shortness of breath) 05/10/2013  . Essential hypertension, benign 04/24/2013  . Chest pain, unspecified 04/24/2013    Past Surgical History:  Procedure Laterality Date  . HERNIA REPAIR         Family History  Problem Relation Age of Onset  . Cancer Mother   . Cancer Father   . Hypertension Sister   . Heart murmur Sister   . CVA Brother   . CVA Brother   . Hypertension Brother     Social History   Tobacco Use  . Smoking status: Never Smoker  . Smokeless tobacco: Never Used  Substance Use Topics  . Alcohol use: Yes    Comment: occ, on weekends  . Drug use: No    Home Medications Prior to Admission medications   Medication Sig Start Date End Date Taking? Authorizing Provider  amLODipine  (NORVASC) 10 MG tablet Take 0.5 tablets (5 mg total) by mouth daily. 10/12/18   Raylene Everts, MD  cyclobenzaprine (FLEXERIL) 10 MG tablet Take 1 tablet (10 mg total) by mouth 2 (two) times daily as needed for muscle spasms. Patient not taking: Reported on 05/01/2019 03/15/19   Malvin Johns, MD  diazepam (VALIUM) 5 MG tablet Take as directed prior to MRI. 05/01/19   Marybelle Killings, MD  fluocinonide cream (LIDEX) 3.15 % Apply 1 application topically 2 (two) times daily.    [provider]  hydrochlorothiazide (HYDRODIURIL) 25 MG tablet Take 25 mg by mouth daily.    [provider]  ibuprofen (ADVIL) 800 MG tablet Take 1 tablet (800 mg total) by mouth every 8 (eight) hours as needed. 08/08/19   Eoin Willden, Harrell Gave, PA-C  meloxicam (MOBIC) 7.5 MG tablet Take 1 tablet (7.5 mg total) by mouth daily. Patient not taking: Reported on 05/01/2019 03/19/19   Augusto Gamble B, NP  ondansetron (ZOFRAN) 4 MG tablet Take 1 tablet (4 mg total) by mouth every 6 (six) hours. 07/03/12   Rancour, Annie Main, MD  predniSONE (DELTASONE) 5 MG tablet Take 6 pills for first day, 5 pills second day, 4 pills third day, 3 pills fourth day, 2 pills the fifth day, and 1 pill sixth day. Patient not taking: Reported on 05/01/2019  03/17/19   Myra Rude, MD  insulin glargine (LANTUS) 100 UNIT/ML injection Inject 16 Units into the skin daily.  04/14/19  [provider]  metFORMIN (GLUCOPHAGE) 500 MG tablet Take 500 mg by mouth.  04/14/19  [provider]  pantoprazole (PROTONIX) 40 MG tablet Take 40 mg by mouth daily.  04/14/19  [provider]  pravastatin (PRAVACHOL) 40 MG tablet Take 40 mg by mouth daily.  04/14/19  [provider]    Allergies    Patient has no known allergies.  Review of Systems   Review of Systems All other systems negative except as documented in the HPI. All pertinent positives and negatives as reviewed in the HPI. Physical Exam Updated Vital  Signs BP (!) 178/106 (BP Location: Left Arm)   Pulse 72   Temp 98.4 F (36.9 C) (Oral)   Resp 14   Ht 5\' 6"  (1.676 m)   Wt 59 kg   SpO2 100%   BMI 20.98 kg/m   Physical Exam Vitals and nursing note reviewed.  Constitutional:      General: He is not in acute distress.    Appearance: He is well-developed.  HENT:     Head: Normocephalic and atraumatic.  Eyes:     Pupils: Pupils are equal, round, and reactive to light.  Pulmonary:     Effort: Pulmonary effort is normal.  Musculoskeletal:     Left ankle: Swelling present. No deformity, ecchymosis or lacerations. No tenderness. Normal range of motion. Normal pulse.       Legs:  Skin:    General: Skin is warm and dry.  Neurological:     Mental Status: He is alert and oriented to person, place, and time.     ED Results / Procedures / Treatments   Labs (all labs ordered are listed, but only abnormal results are displayed) Labs Reviewed - No data to display  EKG None  Radiology No results found.  Procedures Procedures (including critical care time)  Medications Ordered in ED Medications - No data to display  ED Course  I have reviewed the triage vital signs and the nursing notes.  Pertinent labs & imaging results that were available during my care of the patient were reviewed by me and considered in my medical decision making (see chart for details).    MDM Rules/Calculators/A&P                      Patient is placed in an Ace wrap and given treatment for the inflammation.  Have advised patient to return here as needed.  Told to follow-up with the orthopedist provided. Final Clinical Impression(s) / ED Diagnoses Final diagnoses:  Right ankle swelling    Rx / DC Orders ED Discharge Orders         Ordered    ibuprofen (ADVIL) 800 MG tablet  Every 8 hours PRN     08/08/19 [address] PA-C 08/12/19 2346    08/14/19, MD 08/13/19 1212

## 2019-09-03 ENCOUNTER — Ambulatory Visit (HOSPITAL_COMMUNITY)
Admission: EM | Admit: 2019-09-03 | Discharge: 2019-09-03 | Disposition: A | Discharge status: Home / Self Care | Payer: BC Managed Care – PPO | Attending: Family Medicine | Admitting: Family Medicine

## 2019-09-03 ENCOUNTER — Encounter (HOSPITAL_COMMUNITY): Payer: Self-pay

## 2019-09-03 ENCOUNTER — Other Ambulatory Visit: Payer: Self-pay

## 2019-09-03 DIAGNOSIS — M25562 Pain in left knee: Secondary | ICD-10-CM

## 2019-09-03 MED ORDER — PREDNISONE 20 MG PO TABS: ORAL_TABLET | ORAL | 0 refills | Status: DC

## 2019-09-03 MED ORDER — CYCLOBENZAPRINE HCL 10 MG PO TABS: 10.0000 mg | ORAL_TABLET | Freq: Two times a day (BID) | ORAL | 0 refills | Status: DC | PRN

## 2019-09-03 NOTE — ED Triage Notes (Signed)
Pt presents with chronic pain in left knee; pt states he had a scan done of it about 2 weeks ago and nothing was found but he is still having ongoing pain & intermittent swelling of knee.

## 2019-09-03 NOTE — ED Provider Notes (Signed)
[facility]    CSN: [id number] Arrival date & time: 09/03/19  1823      History   Chief Complaint Chief Complaint  Patient presents with  . Knee Pain    HPI Peter Bell is a 49 y.o. male.   He is presenting with left knee pain.  This been ongoing for a few weeks now.  The pain is over the medial joint space.  He also had lower leg swelling associated with it.  Imaging has been negative to this point.  Denies any history of surgery.  No history of similar pain.  He does have a brother that has gout.  No improvement with ibuprofen.  HPI  Past Medical History:  Diagnosis Date  . Asthma   . Diabetes mellitus without complication (HCC)   . Eczema   . GERD (gastroesophageal reflux disease)   . Hypertension     Patient Active Problem List   Diagnosis Date Noted  . Protrusion of lumbar intervertebral disc 05/29/2019  . SOB (shortness of breath) 05/10/2013  . Essential hypertension, benign 04/24/2013  . Chest pain, unspecified 04/24/2013    Past Surgical History:  Procedure Laterality Date  . HERNIA REPAIR         Home Medications    Prior to Admission medications   Medication Sig Start Date End Date Taking? Authorizing Provider  amLODipine (NORVASC) 10 MG tablet Take 0.5 tablets (5 mg total) by mouth daily. 10/12/18   Eustace Moore, MD  cyclobenzaprine (FLEXERIL) 10 MG tablet Take 1 tablet (10 mg total) by mouth 2 (two) times daily as needed for muscle spasms. 09/03/19   Myra Rude, MD  diazepam (VALIUM) 5 MG tablet Take as directed prior to MRI. 05/01/19   Eldred Manges, MD  fluocinonide cream (LIDEX) 0.05 % Apply 1 application topically 2 (two) times daily.    [provider]  hydrochlorothiazide (HYDRODIURIL) 25 MG tablet Take 25 mg by mouth daily.    [provider]  ibuprofen (ADVIL) 800 MG tablet Take 1 tablet (800 mg total) by mouth every 8 (eight) hours as needed. 08/08/19   Lawyer, Cristal Deer, PA-C  predniSONE  (DELTASONE) 20 MG tablet TAKE 3 TABS ONCE DAILY FOR 3 DAYS, 2 FOR 3 DAYS, 1 FOR 2 DAYS THEN 1/2 FOR 2 DAYS 09/03/19   Myra Rude, MD  insulin glargine (LANTUS) 100 UNIT/ML injection Inject 16 Units into the skin daily.  04/14/19  [provider]  metFORMIN (GLUCOPHAGE) 500 MG tablet Take 500 mg by mouth.  04/14/19  [provider]  pantoprazole (PROTONIX) 40 MG tablet Take 40 mg by mouth daily.  04/14/19  [provider]  pravastatin (PRAVACHOL) 40 MG tablet Take 40 mg by mouth daily.  04/14/19  [provider]    Family History Family History  Problem Relation Age of Onset  . Cancer Mother   . Cancer Father   . Hypertension Sister   . Heart murmur Sister   . CVA Brother   . CVA Brother   . Hypertension Brother     Social History Social History   Tobacco Use  . Smoking status: Never Smoker  . Smokeless tobacco: Never Used  Substance Use Topics  . Alcohol use: Yes    Comment: occ, on weekends  . Drug use: No     Allergies   Patient has no known allergies.   Review of Systems Review of Systems  See HPI  Physical Exam Triage Vital Signs  ED Triage Vitals  Enc Vitals Group     BP 09/03/19 1911 (!) 164/91     Pulse Rate 09/03/19 1911 77     Resp 09/03/19 1911 17     Temp 09/03/19 1911 98.7 F (37.1 C)     Temp Source 09/03/19 1911 Oral     SpO2 09/03/19 1911 98 %     Weight --      Height --      Head Circumference --      Peak Flow --      Pain Score 09/03/19 1910 8     Pain Loc --      Pain Edu? --      Excl. in [place]? --    No data found.  Updated Vital Signs BP (!) 164/91 (BP Location: Right Arm)   Pulse 77   Temp 98.7 F (37.1 C) (Oral)   Resp 17   SpO2 98%   Visual Acuity Right Eye Distance:   Left Eye Distance:   Bilateral Distance:    Right Eye Near:   Left Eye Near:    Bilateral Near:     Physical Exam Gen: NAD, alert, cooperative with exam, well-appearing Neuro: normal tone, normal sensation to  touch Psych:  normal insight, alert and oriented MSK:  Left knee: No obvious effusion. Normal range of motion. Tenderness palpation of the medial joint space. Negative Murray's test. No pain with patellar grind. Neurovascular intact   UC Treatments / Results  Labs (all labs ordered are listed, but only abnormal results are displayed) Labs Reviewed - No data to display  EKG   Radiology No results found.  Procedures Procedures (including critical care time)  Medications Ordered in UC Medications - No data to display  Initial Impression / Assessment and Plan / UC Course  I have reviewed the triage vital signs and the nursing notes.  Pertinent labs & imaging results that were available during my care of the patient were reviewed by me and considered in my medical decision making (see chart for details).     Peter Bell is a 49 year old male that is presenting with left knee pain.  Imaging has been negative for any significant degenerative change.  May have degenerative meniscus or gout.  Will provide with prednisone and Flexeril.  Counseled supportive care.  Given occasions follow-up return.  Final Clinical Impressions(s) / UC Diagnoses   Final diagnoses:  Acute pain of left knee     Discharge Instructions     Please try the medicine  Please try compression  Please follow up if your symptoms worsen or fail to improve.     ED Prescriptions    Medication Sig Dispense Auth. Provider   cyclobenzaprine (FLEXERIL) 10 MG tablet Take 1 tablet (10 mg total) by mouth 2 (two) times daily as needed for muscle spasms. 20 tablet Rosemarie Ax, MD   predniSONE (DELTASONE) 20 MG tablet TAKE 3 TABS ONCE DAILY FOR 3 DAYS, 2 FOR 3 DAYS, 1 FOR 2 DAYS THEN 1/2 FOR 2 DAYS 18 tablet Rosemarie Ax, MD     PDMP not reviewed this encounter.   Rosemarie Ax, MD 09/03/19 [phone]

## 2019-09-03 NOTE — Discharge Instructions (Signed)
Please try the medicine  Please try compression  Please follow up if your symptoms worsen or fail to improve.

## 2019-09-11 ENCOUNTER — Ambulatory Visit: Payer: BC Managed Care – PPO | Admitting: Family Medicine

## 2019-09-17 ENCOUNTER — Encounter: Payer: Self-pay | Admitting: Family Medicine

## 2019-09-17 ENCOUNTER — Ambulatory Visit (INDEPENDENT_AMBULATORY_CARE_PROVIDER_SITE_OTHER): Payer: BC Managed Care – PPO | Admitting: Family Medicine

## 2019-09-17 ENCOUNTER — Other Ambulatory Visit: Payer: Self-pay

## 2019-09-17 ENCOUNTER — Ambulatory Visit: Payer: Self-pay

## 2019-09-17 VITALS — BP 152/92 | HR 97 | Ht 66.0 in | Wt 140.0 lb

## 2019-09-17 DIAGNOSIS — M25562 Pain in left knee: Secondary | ICD-10-CM

## 2019-09-17 MED ORDER — IBUPROFEN-FAMOTIDINE 800-26.6 MG PO TABS: 1.0000 | ORAL_TABLET | Freq: Three times a day (TID) | ORAL | 3 refills | Status: DC

## 2019-09-17 NOTE — Assessment & Plan Note (Signed)
X-ray was unrevealing for any changes.  Ultrasound suggested an insufficiency fracture of the medial femoral condyle.  The joint space was maintained with no degenerative meniscal changes. -Counseled on home exercise therapy and supportive care. -Counseled on limited weightbearing. -Counseled on brace. -Duexis and sample provided. -If pain is ongoing would consider MRI -Could consider steroid injection or gel injection.

## 2019-09-17 NOTE — Progress Notes (Signed)
Peter Bell - 49 y.o. male MRN [id number]  Date of birth: [birth year]  SUBJECTIVE:  Including CC & ROS.  Chief Complaint  Patient presents with  . Knee Pain    left knee x 2 months    Peter Bell is a 49 y.o. male that is presenting with left knee pain.  Pain is been ongoing.  Denies any specific inciting event.  Pain is worse over the medial femoral condyle.  It will swell intermittently.  He had a instance of the knee giving way.  No history of similar pain.  Independent review of the left knee x-ray from 4/14 shows no acute abnormality.   Review of Systems See HPI   HISTORY: Past Medical, Surgical, Social, and Family History Reviewed & Updated per EMR.   Pertinent Historical Findings include:  Past Medical History:  Diagnosis Date  . Asthma   . Diabetes mellitus without complication ([place])   . Eczema   . GERD (gastroesophageal reflux disease)   . Hypertension     Past Surgical History:  Procedure Laterality Date  . HERNIA REPAIR      Family History  Problem Relation Age of Onset  . Cancer Mother   . Cancer Father   . Hypertension Sister   . Heart murmur Sister   . CVA Brother   . CVA Brother   . Hypertension Brother     Social History   Socioeconomic History  . Marital status: Married    Spouse name: Not on file  . Number of children: Not on file  . Years of education: Not on file  . Highest education level: Not on file  Occupational History  . Not on file  Tobacco Use  . Smoking status: Never Smoker  . Smokeless tobacco: Never Used  Substance and Sexual Activity  . Alcohol use: Yes    Comment: occ, on weekends  . Drug use: No  . Sexual activity: Not on file  Other Topics Concern  . Not on file  Social History Narrative  . Not on file   Social Determinants of Health   Financial Resource Strain:   . Difficulty of Paying Living Expenses:   Food Insecurity:   . Worried About Charity fundraiser in the Last Year:   . Arboriculturist  in the Last Year:   Transportation Needs:   . Film/video editor (Medical):   Marland Kitchen Lack of Transportation (Non-Medical):   Physical Activity:   . Days of Exercise per Week:   . Minutes of Exercise per Session:   Stress:   . Feeling of Stress :   Social Connections:   . Frequency of Communication with Friends and Family:   . Frequency of Social Gatherings with Friends and Family:   . Attends Religious Services:   . Active Member of Clubs or Organizations:   . Attends Archivist Meetings:   Marland Kitchen Marital Status:   Intimate Partner Violence:   . Fear of Current or Ex-Partner:   . Emotionally Abused:   Marland Kitchen Physically Abused:   . Sexually Abused:      PHYSICAL EXAM:  VS: BP (!) 152/92   Pulse 97   Ht 5\' 6"  (1.676 m)   Wt 140 lb (63.5 kg)   BMI 22.60 kg/m  Physical Exam Gen: NAD, alert, cooperative with exam, well-appearing MSK:  Left knee: No effusion. Normal range of motion. No instability. Tenderness to palpation over the medial femoral condyle. Negative McMurray's test. No  pain with patellar grind. Neurovascularly intact  Limited ultrasound: Left knee:  No effusion is a suprapatellar pouch. Normal appearing quadricep patellar tendon. Normal-appearing medial joint space and meniscus. Increased hyperemia over the medial femoral condyle which would suggest a insufficiency fracture.  Summary: Findings suggest insufficiency fracture of the medial femoral condyle.  Ultrasound and interpretation by Clare Gandy, MD    ASSESSMENT & PLAN:   Acute pain of left knee X-ray was unrevealing for any changes.  Ultrasound suggested an insufficiency fracture of the medial femoral condyle.  The joint space was maintained with no degenerative meniscal changes. -Counseled on home exercise therapy and supportive care. -Counseled on limited weightbearing. -Counseled on brace. -Duexis and sample provided. -If pain is ongoing would consider MRI -Could consider steroid  injection or gel injection.

## 2019-09-17 NOTE — Progress Notes (Signed)
Medication Samples have been provided to the patient.  Drug name: Duexis       Strength: 800mg /26.6mg         Qty: 1 Box  LOT  Exp.Date: 05/2020  Dosing instructions: Take 1 tablet by mouth three (3) times a day.  The patient has been instructed regarding the correct time, dose, and frequency of taking this medication, including desired effects and most common side effects.   06/2020, Kathi Simpers 4:29 PM 09/17/2019

## 2019-09-17 NOTE — Patient Instructions (Signed)
Good to see you Please try ice  Please try the brace  Please try the duexis as needed  Please call Dr. Ophelia Charter office to figure out where the MRI is and be able to get it done.   Please send me a message in MyChart with any questions or updates.  Please see me back in 4 weeks.   --Dr. [name] Likes

## 2019-09-25 ENCOUNTER — Telehealth: Payer: Self-pay | Admitting: Orthopaedic Surgery

## 2019-09-25 NOTE — Telephone Encounter (Signed)
Pt called stating he believes he needs an MRI for his left knee and would like to see about getting that scheduled.   [phone]

## 2019-09-25 NOTE — Telephone Encounter (Signed)
We need to see him in the office for his knee per his insurance

## 2019-10-09 ENCOUNTER — Ambulatory Visit: Payer: Self-pay

## 2019-10-09 ENCOUNTER — Ambulatory Visit (INDEPENDENT_AMBULATORY_CARE_PROVIDER_SITE_OTHER): Payer: BC Managed Care – PPO | Admitting: Orthopaedic Surgery

## 2019-10-09 ENCOUNTER — Encounter: Payer: Self-pay | Admitting: Orthopaedic Surgery

## 2019-10-09 VITALS — BP 159/95 | HR 90 | Ht 66.0 in | Wt 140.0 lb

## 2019-10-09 DIAGNOSIS — M25562 Pain in left knee: Secondary | ICD-10-CM

## 2019-10-09 DIAGNOSIS — M5126 Other intervertebral disc displacement, lumbar region: Secondary | ICD-10-CM
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
## 2019-10-15 ENCOUNTER — Ambulatory Visit: Payer: BC Managed Care – PPO | Admitting: Family Medicine

## 2019-10-15 NOTE — Progress Notes (Deleted)
  Peter Bell - 49 y.o. male MRN [id number]  Date of birth: 06-17-1970  SUBJECTIVE:  Including CC & ROS.  No chief complaint on file.   Peter Bell is a 49 y.o. male that is  ***.  ***   Review of Systems See HPI   HISTORY: Past Medical, Surgical, Social, and Family History Reviewed & Updated per EMR.   Pertinent Historical Findings include:  Past Medical History:  Diagnosis Date  . Asthma   . Diabetes mellitus without complication (HCC)   . Eczema   . GERD (gastroesophageal reflux disease)   . Hypertension     Past Surgical History:  Procedure Laterality Date  . HERNIA REPAIR      Family History  Problem Relation Age of Onset  . Cancer Mother   . Cancer Father   . Hypertension Sister   . Heart murmur Sister   . CVA Brother   . CVA Brother   . Hypertension Brother     Social History   Socioeconomic History  . Marital status: Married    Spouse name: Not on file  . Number of children: Not on file  . Years of education: Not on file  . Highest education level: Not on file  Occupational History  . Not on file  Tobacco Use  . Smoking status: Never Smoker  . Smokeless tobacco: Never Used  Substance and Sexual Activity  . Alcohol use: Yes    Comment: occ, on weekends  . Drug use: No  . Sexual activity: Not on file  Other Topics Concern  . Not on file  Social History Narrative  . Not on file   Social Determinants of Health   Financial Resource Strain:   . Difficulty of Paying Living Expenses:   Food Insecurity:   . Worried About Programme researcher, broadcasting/film/video in the Last Year:   . Barista in the Last Year:   Transportation Needs:   . Freight forwarder (Medical):   Marland Kitchen Lack of Transportation (Non-Medical):   Physical Activity:   . Days of Exercise per Week:   . Minutes of Exercise per Session:   Stress:   . Feeling of Stress :   Social Connections:   . Frequency of Communication with Friends and Family:   . Frequency of Social  Gatherings with Friends and Family:   . Attends Religious Services:   . Active Member of Clubs or Organizations:   . Attends Banker Meetings:   Marland Kitchen Marital Status:   Intimate Partner Violence:   . Fear of Current or Ex-Partner:   . Emotionally Abused:   Marland Kitchen Physically Abused:   . Sexually Abused:      PHYSICAL EXAM:  VS: There were no vitals taken for this visit. Physical Exam Gen: NAD, alert, cooperative with exam, well-appearing MSK:  ***      ASSESSMENT & PLAN:   No problem-specific Assessment & Plan notes found for this encounter.

## 2019-10-16 ENCOUNTER — Encounter: Payer: Self-pay | Admitting: Physical Medicine and Rehabilitation

## 2019-10-16 ENCOUNTER — Ambulatory Visit (INDEPENDENT_AMBULATORY_CARE_PROVIDER_SITE_OTHER): Payer: BC Managed Care – PPO | Admitting: Physical Medicine and Rehabilitation

## 2019-10-16 ENCOUNTER — Ambulatory Visit: Payer: Self-pay

## 2019-10-16 ENCOUNTER — Other Ambulatory Visit: Payer: Self-pay

## 2019-10-16 VITALS — BP 169/101 | HR 72

## 2019-10-16 DIAGNOSIS — M48062 Spinal stenosis, lumbar region with neurogenic claudication: Secondary | ICD-10-CM

## 2019-10-16 DIAGNOSIS — M5116 Intervertebral disc disorders with radiculopathy, lumbar region: Secondary | ICD-10-CM | POA: Diagnosis not present

## 2019-10-16 DIAGNOSIS — M5416 Radiculopathy, lumbar region: Secondary | ICD-10-CM | POA: Diagnosis not present

## 2019-10-16 MED ORDER — METHYLPREDNISOLONE ACETATE 80 MG/ML IJ SUSP
80.0000 mg | Freq: Once | INTRAMUSCULAR | Status: AC
  Administered 2019-10-16: 80 mg

## 2019-10-16 NOTE — Progress Notes (Signed)
Peter Bell - 49 y.o. male MRN [id number]  Date of birth: 01/01/1971  Office Visit Note: Visit Date: 10/16/2019 PCP: Patient, No Pcp Per Referred by: Eldred Manges, MD  Subjective: Chief Complaint  Patient presents with  . Lower Back - Pain   HPI:  Peter Bell is a 49 y.o. male who comes in today at the request of Dr. Annell Greening for planned Left L4-L5  And Right S1Lumbar epidural steroid injection with fluoroscopic guidance.  The patient has failed conservative care including home exercise, medications, time and activity modification.  This injection will be diagnostic and hopefully therapeutic.  Please see requesting physician notes for further details and justification.  Patient has bilateral S1 symptoms likely from the L5-S1 disc protrusion and lateral recess narrowing but he also has left radicular pain more of an L4-L5 distribution. We elected to complete left L4 and right S1 transforaminal injection.  ROS Otherwise per HPI.  Assessment & Plan: Visit Diagnoses:  1. Radiculopathy due to lumbar intervertebral disc disorder   2. Spinal stenosis of lumbar region with neurogenic claudication   3. Lumbar radiculopathy     Plan: No additional findings.   Meds & Orders:  Meds ordered this encounter  Medications  . methylPREDNISolone acetate (DEPO-MEDROL) injection 80 mg    Orders Placed This Encounter  Procedures  . XR C-ARM NO REPORT  . Epidural Steroid injection    Follow-up: Return in about 2 weeks (around 10/30/2019) for Annell Greening, M.D..   Procedures: No procedures performed  Lumbosacral Transforaminal Epidural Steroid Injection - Sub-Pedicular Approach with Fluoroscopic Guidance  Patient: Peter Bell      Date of Birth: 04/20/1971 MRN: [id number] PCP: Patient, No Pcp Per      Visit Date: 10/16/2019   Universal Protocol:    Date/Time: 10/16/2019  Consent Given By: the patient  Position: PRONE  Additional Comments: Vital signs were monitored  before and after the procedure. Patient was prepped and draped in the usual sterile fashion. The correct patient, procedure, and site was verified.   Injection Procedure Details:  Procedure Site One Meds Administered:  Meds ordered this encounter  Medications  . methylPREDNISolone acetate (DEPO-MEDROL) injection 80 mg    Laterality: Left, Right  Location/Site:  L4-L5, S1-2  Needle size: 22 G  Needle type: Spinal  Needle Placement: Transforaminal  Findings:    -Comments: Excellent flow of contrast along the nerve, nerve root and into the epidural space.  Procedure Details: After squaring off the end-plates to get a true AP view, the C-arm was positioned so that an oblique view of the foramen as noted above was visualized. The target area is just inferior to the "nose of the scotty dog" or sub pedicular. The soft tissues overlying this structure were infiltrated with 2-3 ml. of 1% Lidocaine without Epinephrine.  The spinal needle was inserted toward the target using a "trajectory" view along the fluoroscope beam.  Under AP and lateral visualization, the needle was advanced so it did not puncture dura and was located close the 6 O'Clock position of the pedical in AP tracterory. Biplanar projections were used to confirm position. Aspiration was confirmed to be negative for CSF and/or blood. A 1-2 ml. volume of Isovue-250 was injected and flow of contrast was noted at each level. Radiographs were obtained for documentation purposes.   After attaining the desired flow of contrast documented above, a 0.5 to 1.0 ml test dose of 0.25% Marcaine was injected into each respective transforaminal  space.  The patient was observed for 90 seconds post injection.  After no sensory deficits were reported, and normal lower extremity motor function was noted,   the above injectate was administered so that equal amounts of the injectate were placed at each foramen (level) into the transforaminal epidural  space.   Additional Comments:  No complications occurred Dressing: 2 x 2 sterile gauze and Band-Aid    Post-procedure details: Patient was observed during the procedure. Post-procedure instructions were reviewed.  Patient left the clinic in stable condition.      Clinical History: Acute Interface, Incoming Rad Results - 05/09/2019 4:31 PM EST  TECHNIQUE: Multiplanar, multisequence MR imaging obtained through the lumbar spine without contrast.   COMPARISON: None.   INDICATION: Low back pain, left and right leg pain.   FINDINGS:  Osseous structures: Normal marrow signal. No fracture or vertebral body height loss. No aggressive osseous lesions. No pars defects.  Alignment: No focal subluxation.  Conus medullaris/cauda equina: Normal in appearance.  Paraspinous soft tissues: Unremarkable.    Levels:   T12-L1: No significant stenosis.   L1-L2: No significant stenosis.   L2-L3: Mild broad-based posterior disc bulge. There is mild stenosis of the central canal. The foramina remain patent.   L3-L4: No significant stenosis.   L4-L5: Disc degenerative change with a broad-based posterior disc bulge slightly eccentric to the left. There is facet arthrosis with small facet joint effusions bilaterally. Bulky thickening of the ligamentum flavum. Moderate circumferential stenosis of  the central canal. Severe left greater than right foraminal stenosis.   L5-S1: Loss of disc space height with a left paracentral broad-based disc extrusion extending slightly inferior. There is left lateral recess stenosis with impingement of the traversing left S1 root. Severe foraminal stenosis bilaterally.    IMPRESSION:  1. At L5-S1 there is a left paracentral disc extrusion with severe stenosis of the left lateral recess and bilateral foramina.  2. At L4-5 there is multifactorial moderate stenosis of the central canal and severe bilateral foraminal stenosis.  3. Mild canal stenosis at L2-3.    Electronically Signed by: Tonita Phoenix Specimen Collected: - Date: 05/09/19 Received From: [facility]     Objective:  VS:  HT:    WT:   BMI:     BP:(!) 169/101  HR:72bpm  TEMP: ( )  RESP:  Physical Exam Constitutional:      General: He is not in acute distress.    Appearance: Normal appearance. He is not ill-appearing.  HENT:     Head: Normocephalic and atraumatic.     Right Ear: External ear normal.     Left Ear: External ear normal.  Eyes:     Extraocular Movements: Extraocular movements intact.  Cardiovascular:     Rate and Rhythm: Normal rate.     Pulses: Normal pulses.  Abdominal:     General: There is no distension.     Palpations: Abdomen is soft.  Musculoskeletal:        General: No tenderness or signs of injury.     Right lower leg: No edema.     Left lower leg: No edema.     Comments: Patient has good distal strength without clonus.  Skin:    Findings: No erythema or rash.  Neurological:     General: No focal deficit present.     Mental Status: He is alert and oriented to person, place, and time.     Sensory: No sensory deficit.     Motor: No  weakness or abnormal muscle tone.     Coordination: Coordination normal.  Psychiatric:        Mood and Affect: Mood normal.        Behavior: Behavior normal.      Imaging: XR C-ARM NO REPORT  Result Date: 10/16/2019 Please see Notes tab for imaging impression.

## 2019-10-16 NOTE — Progress Notes (Signed)
Left knee pain and bilateral posterior thigh pain. Does not have much lower back pain.  Numeric Pain Rating Scale and Functional Assessment Average Pain 7   In the last MONTH (on 0-10 scale) has pain interfered with the following?  1. General activity like being  able to carry out your everyday physical activities such as walking, climbing stairs, carrying groceries, or moving a chair?  Rating(8)   +Driver, -BT, -Dye Allergies.

## 2019-10-17 NOTE — Procedures (Signed)
Lumbosacral Transforaminal Epidural Steroid Injection - Sub-Pedicular Approach with Fluoroscopic Guidance  Patient: Peter Bell      Date of Birth: 1970/08/09 MRN: [id number] PCP: Patient, No Pcp Per      Visit Date: 10/16/2019   Universal Protocol:    Date/Time: 10/16/2019  Consent Given By: the patient  Position: PRONE  Additional Comments: Vital signs were monitored before and after the procedure. Patient was prepped and draped in the usual sterile fashion. The correct patient, procedure, and site was verified.   Injection Procedure Details:  Procedure Site One Meds Administered:  Meds ordered this encounter  Medications  . methylPREDNISolone acetate (DEPO-MEDROL) injection 80 mg    Laterality: Left, Right  Location/Site:  L4-L5, S1-2  Needle size: 22 G  Needle type: Spinal  Needle Placement: Transforaminal  Findings:    -Comments: Excellent flow of contrast along the nerve, nerve root and into the epidural space.  Procedure Details: After squaring off the end-plates to get a true AP view, the C-arm was positioned so that an oblique view of the foramen as noted above was visualized. The target area is just inferior to the "nose of the scotty dog" or sub pedicular. The soft tissues overlying this structure were infiltrated with 2-3 ml. of 1% Lidocaine without Epinephrine.  The spinal needle was inserted toward the target using a "trajectory" view along the fluoroscope beam.  Under AP and lateral visualization, the needle was advanced so it did not puncture dura and was located close the 6 O'Clock position of the pedical in AP tracterory. Biplanar projections were used to confirm position. Aspiration was confirmed to be negative for CSF and/or blood. A 1-2 ml. volume of Isovue-250 was injected and flow of contrast was noted at each level. Radiographs were obtained for documentation purposes.   After attaining the desired flow of contrast documented above, a 0.5  to 1.0 ml test dose of 0.25% Marcaine was injected into each respective transforaminal space.  The patient was observed for 90 seconds post injection.  After no sensory deficits were reported, and normal lower extremity motor function was noted,   the above injectate was administered so that equal amounts of the injectate were placed at each foramen (level) into the transforaminal epidural space.   Additional Comments:  No complications occurred Dressing: 2 x 2 sterile gauze and Band-Aid    Post-procedure details: Patient was observed during the procedure. Post-procedure instructions were reviewed.  Patient left the clinic in stable condition.

## 2019-10-24 ENCOUNTER — Telehealth: Payer: Self-pay | Admitting: Orthopaedic Surgery

## 2019-10-24 NOTE — Telephone Encounter (Signed)
Received vm from Vicky w/ [employer], she inquiring status of request for records. IC,lmvm [phone], advised we have not received their request, asked it to be re-sent and left both of our fax numbers.

## 2019-11-04 ENCOUNTER — Emergency Department (HOSPITAL_COMMUNITY)
Admission: EM | Admit: 2019-11-04 | Discharge: 2019-11-04 | Disposition: A | Discharge status: Home / Self Care | Payer: BC Managed Care – PPO | Attending: Emergency Medicine | Admitting: Emergency Medicine

## 2019-11-04 ENCOUNTER — Encounter (HOSPITAL_COMMUNITY): Payer: Self-pay | Admitting: Emergency Medicine

## 2019-11-04 ENCOUNTER — Other Ambulatory Visit: Payer: Self-pay

## 2019-11-04 ENCOUNTER — Emergency Department (HOSPITAL_COMMUNITY): Payer: BC Managed Care – PPO

## 2019-11-04 DIAGNOSIS — Z5321 Procedure and treatment not carried out due to patient leaving prior to being seen by health care provider: Secondary | ICD-10-CM | POA: Diagnosis not present

## 2019-11-04 DIAGNOSIS — M25551 Pain in right hip: Secondary | ICD-10-CM | POA: Diagnosis present

## 2019-11-04 NOTE — ED Triage Notes (Signed)
Pt c/o right hip pain, states this morning when he got up his hip "gave out" causing him to fall. Pt then reports he went to work and pain continued to increase.

## 2019-11-04 NOTE — ED Notes (Signed)
Per registration, pt stated he is leaving.

## 2019-11-06 ENCOUNTER — Ambulatory Visit (INDEPENDENT_AMBULATORY_CARE_PROVIDER_SITE_OTHER): Payer: Worker's Compensation | Admitting: Orthopaedic Surgery

## 2019-11-06 ENCOUNTER — Encounter: Payer: BC Managed Care – PPO | Admitting: Physical Medicine and Rehabilitation

## 2019-11-06 VITALS — BP 190/112 | HR 81 | Ht 66.0 in | Wt 140.0 lb

## 2019-11-06 DIAGNOSIS — M5126 Other intervertebral disc displacement, lumbar region: Secondary | ICD-10-CM

## 2019-11-08 ENCOUNTER — Other Ambulatory Visit: Payer: Self-pay | Admitting: Orthopaedic Surgery

## 2019-11-08 ENCOUNTER — Telehealth: Payer: Self-pay | Admitting: Orthopaedic Surgery

## 2019-11-08 MED ORDER — CYCLOBENZAPRINE HCL 10 MG PO TABS: 10.0000 mg | ORAL_TABLET | Freq: Two times a day (BID) | ORAL | 0 refills | Status: DC | PRN

## 2019-11-08 NOTE — Telephone Encounter (Signed)
I SENT IT AGAIN. Peter Bell

## 2019-11-08 NOTE — Progress Notes (Signed)
Office Visit Note   Patient: Peter Bell           Date of Birth: 09/21/1970           MRN: [id number] Visit Date: 11/06/2019              Requested by: No referring provider defined for this encounter. PCP: Patient, No Pcp Per   Assessment & Plan: Visit Diagnoses:  1. Protrusion of lumbar intervertebral disc     Plan: We reviewed MRI findings once again.  He got good relief with epidural.  I plan to recheck him in 2 months.  Recheck 2 months.  Outlined treatment plan discussed with patient and case manager.  Follow-Up Instructions: Return in about 2 months (around 01/07/2020).   Orders:  No orders of the defined types were placed in this encounter.  No orders of the defined types were placed in this encounter.     Procedures: No procedures performed   Clinical Data: No additional findings.   Subjective: Chief Complaint  Patient presents with  . Lower Back - Follow-up    HPI 49 year old male returns post Worker's Comp. injury on the job on 03/15/2019.  Patient had epidural injection 10/16/2019 and states the injection has really helped.  He states his back is doing well right now he is moving better.  He still has some trouble sleeping and is requesting a refill of the Flexeril.  Patient states pain seems to be worse in the right hip region he is using ibuprofen 800 mg p.o. twice daily.  Blood pressure elevated 190/112 and I discussed with him in detail he needs to get on his blood pressure medication and take it as instructed.  Patient denies chills or fever no bowel bladder associated symptoms with his back pain problems.  Previous MRI scan showed moderate stenosis L4-5 with fluid-filled facets.  At L5-S1 a left paracentral disc protrusion with nerve root displacement and lateral recess compression.  Slight protrusion at L2-3 asymptomatic.  Patient is here with case manager Anabel Bene RN , CCM at  [email or web address]  Review of Systems 14 point  system update unchanged from previous visit other than as mentioned HPI.   Objective: Vital Signs: BP (!) 190/112   Pulse 81   Ht 5\' 6"  (1.676 m)   Wt 140 lb (63.5 kg)   BMI 22.60 kg/m   Physical Exam Constitutional:      Appearance: He is well-developed.  HENT:     Head: Normocephalic and atraumatic.  Eyes:     Pupils: Pupils are equal, round, and reactive to light.  Neck:     Thyroid: No thyromegaly.     Trachea: No tracheal deviation.  Cardiovascular:     Rate and Rhythm: Normal rate.  Pulmonary:     Effort: Pulmonary effort is normal.     Breath sounds: No wheezing.  Abdominal:     General: Bowel sounds are normal.     Palpations: Abdomen is soft.  Skin:    General: Skin is warm and dry.     Capillary Refill: Capillary refill takes less than 2 seconds.  Neurological:     Mental Status: He is alert and oriented to person, place, and time.  Psychiatric:        Behavior: Behavior normal.        Thought Content: Thought content normal.        Judgment: Judgment normal.     Ortho Exam patient gets from sitting to standing  easily is able to heel and toe walk across the room at a rapid rate.  Knee and ankle jerk are intact. Specialty Comments:  No specialty comments available.  Imaging: No results found.   PMFS History: Patient Active Problem List   Diagnosis Date Noted  . Acute pain of left knee 09/17/2019  . Protrusion of lumbar intervertebral disc 05/29/2019  . SOB (shortness of breath) 05/10/2013  . Essential hypertension, benign 04/24/2013  . Chest pain, unspecified 04/24/2013   Past Medical History:  Diagnosis Date  . Asthma   . Diabetes mellitus without complication (HCC)   . Eczema   . GERD (gastroesophageal reflux disease)   . Hypertension     Family History  Problem Relation Age of Onset  . Cancer Mother   . Cancer Father   . Hypertension Sister   . Heart murmur Sister   . CVA Brother   . CVA Brother   . Hypertension Brother     Past  Surgical History:  Procedure Laterality Date  . HERNIA REPAIR     Social History   Occupational History  . Not on file  Tobacco Use  . Smoking status: Never Smoker  . Smokeless tobacco: Never Used  Substance and Sexual Activity  . Alcohol use: Yes    Comment: occ, on weekends  . Drug use: No  . Sexual activity: Not on file

## 2019-11-08 NOTE — Telephone Encounter (Signed)
S/w pt - advised 

## 2019-11-08 NOTE — Telephone Encounter (Signed)
Pls advise. Thanks.  

## 2019-11-08 NOTE — Telephone Encounter (Signed)
Patient called.   He said a muscle relaxer was supposed to be called in for him after his last appointment but he has not received it yet.   Call back: [phone]

## 2019-11-15 ENCOUNTER — Telehealth: Payer: Self-pay | Admitting: Orthopaedic Surgery

## 2019-11-15 NOTE — Telephone Encounter (Signed)
Received vm from Vickie w/ [employer] checking on request for records. IC,lmvm. Advised we still do not have their request. Left both our fax numbers for request to be sent.

## 2020-01-08 ENCOUNTER — Ambulatory Visit: Payer: BC Managed Care – PPO | Admitting: Orthopaedic Surgery

## 2020-01-09 ENCOUNTER — Ambulatory Visit: Payer: BC Managed Care – PPO | Admitting: Orthopaedic Surgery

## 2020-01-18 ENCOUNTER — Ambulatory Visit: Payer: Self-pay | Admitting: Orthopaedic Surgery

## 2020-02-05 ENCOUNTER — Encounter: Payer: Self-pay | Admitting: Orthopaedic Surgery

## 2020-02-05 ENCOUNTER — Ambulatory Visit (INDEPENDENT_AMBULATORY_CARE_PROVIDER_SITE_OTHER): Payer: Self-pay | Admitting: Orthopaedic Surgery

## 2020-02-05 VITALS — BP 185/108 | HR 87 | Ht 66.0 in | Wt 140.0 lb

## 2020-02-05 DIAGNOSIS — M4807 Spinal stenosis, lumbosacral region: Secondary | ICD-10-CM

## 2020-02-05 DIAGNOSIS — M5126 Other intervertebral disc displacement, lumbar region: Secondary | ICD-10-CM

## 2020-02-05 MED ORDER — CYCLOBENZAPRINE HCL 10 MG PO TABS: 10.0000 mg | ORAL_TABLET | Freq: Two times a day (BID) | ORAL | 0 refills | Status: DC | PRN

## 2020-02-05 NOTE — Progress Notes (Signed)
Office Visit Note   Patient: Peter Bell           Date of Birth: 07/27/1970           MRN: [id number] Visit Date: 02/05/2020              Requested by: No referring provider defined for this encounter. PCP: Patient, No Pcp Per   Assessment & Plan: Visit Diagnoses:  1. Protrusion of lumbar intervertebral disc     Plan: Work slip given for continued modified work as before pending office follow-up after new MRI scan.  MRI scan will be ordered to evaluate the large L5-S1 HNP that was present on January scan.  Patient's had persistent symptoms and is ready to consider operative intervention.  If the disc has decreased in size then surgery likely can be avoided.  Last epidural that he had did not give him sustained relief but he did get some improvement.  First epidural was much more effective.  Today we reviewed the MRI scan with patient and also with his medical case manager Anabel Bene RN,CCM.  We discussed two-level fusion versus microdiscectomy L5-S1.  His principal problem has been radicular symptoms on the left side and not claudication.  We will obtain a new MRI scan so we can make a determination if microdiscectomy at this point is recommended.  Flexeril was renewed today.  I discussed the need to be best avoid any narcotic medication in case he did need to have surgery and then the medication with the effective in the postoperative time period.   Follow-Up Instructions: Return in about 1 month (around 03/07/2020).   Orders:  No orders of the defined types were placed in this encounter.  Meds ordered this encounter  Medications  . cyclobenzaprine (FLEXERIL) 10 MG tablet    Sig: Take 1 tablet (10 mg total) by mouth 2 (two) times daily as needed for muscle spasms.    Dispense:  20 tablet    Refill:  0      Procedures: No procedures performed   Clinical Data: No additional findings.   Subjective: Chief Complaint  Patient presents with  . Lower Back -  Follow-up    OTJI 03/15/2019    HPI 49 year old male returns for follow-up on-the-job injury 03/15/2019.  Patient's rehab case manager is with him again today.  He is continuing to work modified duty.  He states he took 1 tablet of Percocet that gave him good pain relief for more than a day and is wondering if this would be something he could take after work to give him relief.  He is used the Flexeril at night he has not really noted any improvement with the ibuprofen.  Previous MRI scan on January 13 lumbar spine showed large HNP with extrusion L5-S1 with spinal stenosis due to the large disc and also moderate multifactorial spinal stenosis at L4-5 with increased facet fluid in both facets at L4-5.  He is not really having any claudication symptoms his principal problem is been leg pain radicular pain down his left leg down to his left ankle worse with activities.  Review of Systems updated unchanged from previous visits.  He does have some hypertension and some intermittent problems with his left knee.  All other systems are negative.   Objective: Vital Signs: BP (!) 185/108   Pulse 87   Ht 5\' 6"  (1.676 m)   Wt 140 lb (63.5 kg)   BMI 22.60 kg/m   Physical Exam  Constitutional:      Appearance: He is well-developed.  HENT:     Head: Normocephalic and atraumatic.  Eyes:     Pupils: Pupils are equal, round, and reactive to light.  Neck:     Thyroid: No thyromegaly.     Trachea: No tracheal deviation.  Cardiovascular:     Rate and Rhythm: Normal rate.  Pulmonary:     Effort: Pulmonary effort is normal.     Breath sounds: No wheezing.  Abdominal:     General: Bowel sounds are normal.     Palpations: Abdomen is soft.  Skin:    General: Skin is warm and dry.     Capillary Refill: Capillary refill takes less than 2 seconds.  Neurological:     Mental Status: He is alert and oriented to person, place, and time.  Psychiatric:        Behavior: Behavior normal.        Thought Content:  Thought content normal.        Judgment: Judgment normal.     Ortho Exam straight leg raising on the left at 70 degrees positive popliteal compression test positive sciatic notch tenderness on the left negative on the right.  He is able to still heel and toe walk.  Decreased sensation left lateral foot and plantar surface.  Ankle dorsiflexion EHL is strong left and right.  Specialty Comments:  No specialty comments available.  Imaging: No results found.   PMFS History: Patient Active Problem List   Diagnosis Date Noted  . Acute pain of left knee 09/17/2019  . Protrusion of lumbar intervertebral disc 05/29/2019  . SOB (shortness of breath) 05/10/2013  . Essential hypertension, benign 04/24/2013  . Chest pain, unspecified 04/24/2013   Past Medical History:  Diagnosis Date  . Asthma   . Diabetes mellitus without complication (HCC)   . Eczema   . GERD (gastroesophageal reflux disease)   . Hypertension     Family History  Problem Relation Age of Onset  . Cancer Mother   . Cancer Father   . Hypertension Sister   . Heart murmur Sister   . CVA Brother   . CVA Brother   . Hypertension Brother     Past Surgical History:  Procedure Laterality Date  . HERNIA REPAIR     Social History   Occupational History  . Not on file  Tobacco Use  . Smoking status: Never Smoker  . Smokeless tobacco: Never Used  Substance and Sexual Activity  . Alcohol use: Yes    Comment: occ, on weekends  . Drug use: No  . Sexual activity: Not on file

## 2020-03-11 ENCOUNTER — Ambulatory Visit: Payer: Self-pay | Admitting: Orthopaedic Surgery

## 2020-03-25 ENCOUNTER — Ambulatory Visit: Payer: Self-pay | Admitting: Orthopaedic Surgery

## 2020-04-13 ENCOUNTER — Encounter (HOSPITAL_COMMUNITY): Payer: Self-pay | Admitting: *Deleted

## 2020-04-13 ENCOUNTER — Emergency Department (HOSPITAL_COMMUNITY)
Admission: EM | Admit: 2020-04-13 | Discharge: 2020-04-14 | Disposition: A | Discharge status: Home / Self Care | Payer: Self-pay | Attending: Emergency Medicine | Admitting: Emergency Medicine

## 2020-04-13 ENCOUNTER — Other Ambulatory Visit: Payer: Self-pay

## 2020-04-13 DIAGNOSIS — I10 Essential (primary) hypertension: Secondary | ICD-10-CM | POA: Insufficient documentation

## 2020-04-13 DIAGNOSIS — R110 Nausea: Secondary | ICD-10-CM | POA: Insufficient documentation

## 2020-04-13 DIAGNOSIS — E119 Type 2 diabetes mellitus without complications: Secondary | ICD-10-CM | POA: Insufficient documentation

## 2020-04-13 DIAGNOSIS — Z7984 Long term (current) use of oral hypoglycemic drugs: Secondary | ICD-10-CM | POA: Insufficient documentation

## 2020-04-13 DIAGNOSIS — R42 Dizziness and giddiness: Secondary | ICD-10-CM | POA: Insufficient documentation

## 2020-04-13 DIAGNOSIS — Z794 Long term (current) use of insulin: Secondary | ICD-10-CM | POA: Insufficient documentation

## 2020-04-13 DIAGNOSIS — J45909 Unspecified asthma, uncomplicated: Secondary | ICD-10-CM | POA: Insufficient documentation

## 2020-04-13 DIAGNOSIS — Z79899 Other long term (current) drug therapy: Secondary | ICD-10-CM | POA: Insufficient documentation

## 2020-04-13 LAB — DIFFERENTIAL
Abs Immature Granulocytes: 0.04 10*3/uL (ref 0.00–0.07)
Basophils Absolute: 0.1 10*3/uL (ref 0.0–0.1)
Basophils Relative: 0 %
Eosinophils Absolute: 0 10*3/uL (ref 0.0–0.5)
Eosinophils Relative: 0 %
Immature Granulocytes: 0 %
Lymphocytes Relative: 13 %
Lymphs Abs: 1.9 10*3/uL (ref 0.7–4.0)
Monocytes Absolute: 0.8 10*3/uL (ref 0.1–1.0)
Monocytes Relative: 6 %
Neutro Abs: 11.2 10*3/uL — ABNORMAL HIGH (ref 1.7–7.7)
Neutrophils Relative %: 81 %

## 2020-04-13 LAB — COMPREHENSIVE METABOLIC PANEL
ALT: 15 U/L (ref 0–44)
AST: 19 U/L (ref 15–41)
Albumin: 3.8 g/dL (ref 3.5–5.0)
Alkaline Phosphatase: 100 U/L (ref 38–126)
Anion gap: 11 (ref 5–15)
BUN: 8 mg/dL (ref 6–20)
CO2: 27 mmol/L (ref 22–32)
Calcium: 9.3 mg/dL (ref 8.9–10.3)
Chloride: 101 mmol/L (ref 98–111)
Creatinine, Ser: 0.88 mg/dL (ref 0.61–1.24)
GFR, Estimated: >60 mL/min (ref >60)
Glucose, Bld: 203 mg/dL — ABNORMAL HIGH (ref 70–99)
Potassium: 3.9 mmol/L (ref 3.5–5.1)
Sodium: 139 mmol/L (ref 135–145)
Total Bilirubin: 1.4 mg/dL — ABNORMAL HIGH (ref 0.3–1.2)
Total Protein: 7.8 g/dL (ref 6.5–8.1)

## 2020-04-13 LAB — CBC
HCT: 46.9 % (ref 39.0–52.0)
Hemoglobin: 15.6 g/dL (ref 13.0–17.0)
MCH: 31.1 pg (ref 26.0–34.0)
MCHC: 33.3 g/dL (ref 30.0–36.0)
MCV: 93.6 fL (ref 80.0–100.0)
Platelets: 271 10*3/uL (ref 150–400)
RBC: 5.01 MIL/uL (ref 4.22–5.81)
RDW: 12.7 % (ref 11.5–15.5)
WBC: 14 10*3/uL — ABNORMAL HIGH (ref 4.0–10.5)
nRBC: 0 % (ref 0.0–0.2)

## 2020-04-13 LAB — APTT: aPTT: 30 seconds (ref 24–36)

## 2020-04-13 LAB — PROTIME-INR
INR: 1.1 (ref 0.8–1.2)
Prothrombin Time: 13.6 seconds (ref 11.4–15.2)

## 2020-04-13 MED ORDER — SODIUM CHLORIDE 0.9% FLUSH
3.0000 mL | Freq: Once | INTRAVENOUS | Status: DC
Start: 2020-04-13 — End: 2020-04-14

## 2020-04-13 NOTE — ED Triage Notes (Addendum)
Pt reports waking up 0400 with dizziness, hx of vertigo approx 1 year ago. States this feels similar, feels like room is spinning and has nausea with it. No weakness is noted, grips equal, no arm drift, no facial droop, speech is clear. Pt is hypertensive, has been off his meds for extended time.

## 2020-04-14 MED ORDER — MECLIZINE HCL 25 MG PO TABS: 25.0000 mg | ORAL_TABLET | Freq: Three times a day (TID) | ORAL | 0 refills | Status: DC | PRN

## 2020-04-14 MED ORDER — AMLODIPINE BESYLATE 10 MG PO TABS: 10.0000 mg | ORAL_TABLET | Freq: Every day | ORAL | 0 refills | Status: DC

## 2020-04-14 MED ORDER — MECLIZINE HCL 25 MG PO TABS
25.0000 mg | ORAL_TABLET | Freq: Once | ORAL | Status: AC
  Administered 2020-04-14: 01:00:00 25 mg via ORAL
  Filled 2020-04-14: qty 1

## 2020-04-14 MED ORDER — AMLODIPINE BESYLATE 5 MG PO TABS
10.0000 mg | ORAL_TABLET | Freq: Once | ORAL | Status: AC
  Administered 2020-04-14: 01:00:00 10 mg via ORAL
  Filled 2020-04-14: qty 2
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
## 2020-04-22 ENCOUNTER — Encounter: Payer: Self-pay | Admitting: Orthopaedic Surgery

## 2020-04-22 ENCOUNTER — Ambulatory Visit (INDEPENDENT_AMBULATORY_CARE_PROVIDER_SITE_OTHER): Payer: Worker's Compensation | Admitting: Orthopaedic Surgery

## 2020-04-22 ENCOUNTER — Other Ambulatory Visit: Payer: Self-pay

## 2020-04-22 VITALS — Ht 66.0 in | Wt 140.0 lb

## 2020-04-22 DIAGNOSIS — M5126 Other intervertebral disc displacement, lumbar region: Secondary | ICD-10-CM

## 2020-04-22 DIAGNOSIS — M48061 Spinal stenosis, lumbar region without neurogenic claudication: Secondary | ICD-10-CM | POA: Diagnosis not present

## 2020-04-22 MED ORDER — CYCLOBENZAPRINE HCL 10 MG PO TABS: ORAL_TABLET | ORAL | 1 refills | Status: DC

## 2020-04-22 MED ORDER — CYCLOBENZAPRINE HCL 10 MG PO TABS: 10.0000 mg | ORAL_TABLET | Freq: Two times a day (BID) | ORAL | 0 refills | Status: DC | PRN

## 2020-04-22 NOTE — Progress Notes (Signed)
Office Visit Note   Patient: Peter Bell           Date of Birth: 06-Jan-1971           MRN: [id number] Visit Date: 04/22/2020              Requested by: No referring provider defined for this encounter. PCP: Patient, No Pcp Per   Assessment & Plan: Visit Diagnoses:  1. Protrusion of lumbar intervertebral disc     Plan: Patient states the job that he is currently doing has been offered to him for permanent position.  This is better for his back symptoms.  Again I discussed surgery with him since he has significant nerve compression but patient would like to avoid surgery.  He can return in 6 months.  If nothing is changed and he still does not want to consider surgery then we can assign him an impairment rating at that time.  Work slip given for continued modified work.  Patient is seen with Anabel Bene RN CCM medical case manager.  His fax number is [phone].  Follow-Up Instructions: Return in about 6 months (around 10/21/2020).   Orders:  No orders of the defined types were placed in this encounter.  No orders of the defined types were placed in this encounter.     Procedures: No procedures performed   Clinical Data: No additional findings.   Subjective: Chief Complaint  Patient presents with  . Lower Back - Pain, Follow-up    MRI lumbar review OTJI 03/15/2019    HPI 49 year old male returns for on-the-job injury from 03/15/2019 with continued problems with left leg pain.  He is not really having any back pain.  New MRI was obtained.  Patient had the disc but states it was in the renal car does not have it anymore went back but could not find it.  Scan report shows moderate to severe stenosis at L4-5 with severe facet arthropathy and some slight degenerative spondylolisthesis severe left and moderate right neuroforaminal stenosis.  Patient also had a large left paracentral disc herniation with mass-effect on the left S1 nerve root unchanged from previous  MRI imaging from May 09, 2019.  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  He states this is much easier on his back and he states his leg seems to be doing better with this.  Patient again related that he knew someone who had some back surgery problems and had done as well as expected and this is a concern for him.  Review of Systems 14 point systems updated unchanged.  Objective: Vital Signs: Ht 5\' 6"  (1.676 m)   Wt 140 lb (63.5 kg)   BMI 22.60 kg/m   Physical Exam Constitutional:      Appearance: He is well-developed and well-nourished.  HENT:     Head: Normocephalic and atraumatic.  Eyes:     Extraocular Movements: EOM normal.     Pupils: Pupils are equal, round, and reactive to light.  Neck:     Thyroid: No thyromegaly.     Trachea: No tracheal deviation.  Cardiovascular:     Rate and Rhythm: Normal rate.  Pulmonary:     Effort: Pulmonary effort is normal.     Breath sounds: No wheezing.  Abdominal:     General: Bowel sounds are normal.     Palpations: Abdomen is soft.  Skin:    General: Skin is warm and dry.  Capillary Refill: Capillary refill takes less than 2 seconds.  Neurological:     Mental Status: He is alert and oriented to person, place, and time.  Psychiatric:        Mood and Affect: Mood and affect normal.        Behavior: Behavior normal.        Thought Content: Thought content normal.        Judgment: Judgment normal.     Ortho Exam patient stable heel and toe walk he has some pain with straight leg raising.  No weakness anterior tib gastrocsoleus he gets from sitting to standing position comfortably. Specialty Comments:  No specialty comments available.  Imaging: No results found.   PMFS History: Patient Active Problem List   Diagnosis Date Noted  . Acute pain of left knee 09/17/2019  . Protrusion of lumbar intervertebral disc 05/29/2019  . SOB (shortness of breath) 05/10/2013  .  Essential hypertension, benign 04/24/2013  . Chest pain, unspecified 04/24/2013   Past Medical History:  Diagnosis Date  . Asthma   . Diabetes mellitus without complication (HCC)   . Eczema   . GERD (gastroesophageal reflux disease)   . Hypertension     Family History  Problem Relation Age of Onset  . Cancer Mother   . Cancer Father   . Hypertension Sister   . Heart murmur Sister   . CVA Brother   . CVA Brother   . Hypertension Brother     Past Surgical History:  Procedure Laterality Date  . HERNIA REPAIR     Social History   Occupational History  . Not on file  Tobacco Use  . Smoking status: Never Smoker  . Smokeless tobacco: Never Used  Substance and Sexual Activity  . Alcohol use: Yes    Comment: occ, on weekends  . Drug use: No  . Sexual activity: Not on file

## 2020-05-07 ENCOUNTER — Telehealth: Payer: Self-pay

## 2020-05-07 NOTE — Telephone Encounter (Signed)
Patients case manager Eulis Foster called he is requesting notes from patient visit 12/28 to be sent over to his office. [phone] [phone]

## 2020-05-07 NOTE — Telephone Encounter (Signed)
Can you please send notes?

## 2020-05-08 NOTE — Telephone Encounter (Signed)
Betsy  sent via fax per request to Eulis Foster the ov note and also advised him of the next appt., for Mr. Ferdig with Dr. Ophelia Charter, MD scheduled in June 2022. Thx Tisha

## 2020-05-08 NOTE — Telephone Encounter (Signed)
noted 

## 2020-06-26 ENCOUNTER — Encounter (HOSPITAL_COMMUNITY): Payer: Self-pay

## 2020-06-26 ENCOUNTER — Other Ambulatory Visit: Payer: Self-pay

## 2020-06-26 ENCOUNTER — Ambulatory Visit (HOSPITAL_COMMUNITY)
Admission: EM | Admit: 2020-06-26 | Discharge: 2020-06-26 | Disposition: A | Discharge status: Home / Self Care | Payer: Self-pay | Attending: Family Medicine | Admitting: Family Medicine

## 2020-06-26 ENCOUNTER — Ambulatory Visit (INDEPENDENT_AMBULATORY_CARE_PROVIDER_SITE_OTHER): Payer: Self-pay

## 2020-06-26 DIAGNOSIS — R2232 Localized swelling, mass and lump, left upper limb: Secondary | ICD-10-CM

## 2020-06-26 DIAGNOSIS — I10 Essential (primary) hypertension: Secondary | ICD-10-CM

## 2020-06-26 DIAGNOSIS — M79642 Pain in left hand: Secondary | ICD-10-CM

## 2020-06-26 MED ORDER — LOSARTAN POTASSIUM 50 MG PO TABS: 50.0000 mg | ORAL_TABLET | Freq: Every day | ORAL | 2 refills | Status: DC

## 2020-06-26 MED ORDER — AMLODIPINE BESYLATE 10 MG PO TABS: 10.0000 mg | ORAL_TABLET | Freq: Every day | ORAL | 2 refills | Status: DC

## 2020-06-26 MED ORDER — PREDNISONE 20 MG PO TABS: 40.0000 mg | ORAL_TABLET | Freq: Every day | ORAL | 0 refills | Status: DC

## 2020-06-26 NOTE — ED Triage Notes (Signed)
Pt presents with left hand pain and swelling. Pt states it has been hurting and swollen for 3 days. He states he is able to move his fingers around and hand, and states his hand sometimes is sore.

## 2020-06-26 NOTE — Discharge Instructions (Signed)
Your blood pressure was noted to be elevated during your visit today. If you are currently taking medication for high blood pressure, please ensure you are taking this as directed. If you do not have a history of high blood pressure and your blood pressure remains persistently elevated, you may need to begin taking a medication at some point. You may return here within the next few days to recheck if unable to see your primary care provider or if you do not have a one.  BP (!) 198/118 (BP Location: Right Arm) Comment: pt states he forgot to take his medications   Pulse 72    Temp 98.7 F (37.1 C) (Oral)    Resp 17    SpO2 98%   BP Readings from Last 3 Encounters:  06/26/20 (!) 198/118  04/14/20 (!) 156/89  02/05/20 (!) 185/108

## 2020-06-26 NOTE — ED Provider Notes (Signed)
[facility]   [id number] 06/26/20 Arrival Time: 1601  ASSESSMENT & PLAN:  1. Left hand pain   2. Elevated blood pressure reading in office with diagnosis of hypertension     I have personally viewed the imaging studies ordered this visit. No bony abnormalities appreciated.    Discharge Instructions      Your blood pressure was noted to be elevated during your visit today. If you are currently taking medication for high blood pressure, please ensure you are taking this as directed. If you do not have a history of high blood pressure and your blood pressure remains persistently elevated, you may need to begin taking a medication at some point. You may return here within the next few days to recheck if unable to see your primary care provider or if you do not have a one.  BP (!) 198/118 (BP Location: Right Arm) Comment: pt states he forgot to take his medications  Pulse 72   Temp 98.7 F (37.1 C) (Oral)   Resp 17   SpO2 98%   BP Readings from Last 3 Encounters:  06/26/20 (!) 198/118  04/14/20 (!) 156/89  02/05/20 (!) 185/108       Discussed likely inflammatory problem causing R hand swelling.   Meds ordered this encounter  Medications  . losartan (COZAAR) 50 MG tablet    Sig: Take 1 tablet (50 mg total) by mouth daily.    Dispense:  30 tablet    Refill:  2  . predniSONE (DELTASONE) 20 MG tablet    Sig: Take 2 tablets (40 mg total) by mouth daily.    Dispense:  10 tablet    Refill:  0  . amLODipine (NORVASC) 10 MG tablet    Sig: Take 1 tablet (10 mg total) by mouth daily.    Dispense:  30 tablet    Refill:  2    Work note provided.   Recommend:  Follow-up Information    [facility].   Specialty: Urgent Care Why: If worsening or failing to improve as anticipated. Contact information: [address] [place] [address] [phone]       Schedule an appointment as soon as possible for a visit  with [facility].   Why: To monitor and treat your blood pressure. Contact information: [address] [place] [address] [phone]              Reviewed expectations re: course of current medical issues. Questions answered. Outlined signs and symptoms indicating need for more acute intervention. Patient verbalized understanding. After Visit Summary given.  SUBJECTIVE: History from: patient. Peter Bell is a 50 y.o. male who reports R hand swelling; around 3rd MCP joint; questions duration but definitely worse over past 3 d. No trauma/injury. No extremity sensation changes or weakness. No OTC tx.  Increased blood pressure noted today. Reports that he is treated for HTN but out of medications. Believes he is only taking Losartan since he ran out of amlodipine first.   He reports no chest pain on exertion, no dyspnea on exertion, no swelling of ankles, no orthostatic dizziness or lightheadedness, no orthopnea or paroxysmal nocturnal dyspnea, no palpitations and no intermittent claudication symptoms.   Past Surgical History:  Procedure Laterality Date  . HERNIA REPAIR        OBJECTIVE:  Vitals:   06/26/20 1620  BP: (!) 198/118  Pulse: 72  Resp: 17  Temp:  98.7 F (37.1 C)  TempSrc: Oral  SpO2: 98%    General appearance: alert; no distress HEENT: [place]; AT Neck: supple with FROM Resp: unlabored respirations Extremities: . LUE: warm with well perfused appearance; poorly localized mild tenderness over left dorsal hand close to 3rd MCP joint; without gross deformities; swelling: minimal; bruising: none; wrist ROM: normal CV: brisk extremity capillary refill of LUE; 2+ radial pulse of LUE. Skin: warm and dry; no visible rashes Neurologic: gait normal; normal sensation and strength of LUE Psychological: alert and cooperative; normal mood and affect  Imaging: DG Hand Complete Left  Result Date: 06/26/2020 CLINICAL DATA:  Left hand  pain and swelling for 3 days EXAM: LEFT HAND - COMPLETE 3+ VIEW COMPARISON:  None. FINDINGS: Frontal, oblique, and lateral views of the left hand are obtained. No acute fracture, subluxation, or dislocation. Osteoarthritis involving the distal interphalangeal joints, greatest at the fifth D IP. There is a 2 mm linear metallic foreign body within the dorsal soft tissues overlying the distal aspect of the third proximal phalanx. This is of uncertain acuity. Soft tissues are otherwise unremarkable. IMPRESSION: 1. No acute displaced fracture. 2. 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 3. 2 mm linear metallic foreign body within the dorsal soft tissues overlying the distal aspect third proximal phalanx. This is of uncertain acuity. Electronically Signed   By: Sharlet Salina M.D.   On: 06/26/2020 17:22      No Known Allergies  Past Medical History:  Diagnosis Date  . Asthma   . Diabetes mellitus without complication (HCC)   . Eczema   . GERD (gastroesophageal reflux disease)   . Hypertension    Social History   Socioeconomic History  . Marital status: Married    Spouse name: Not on file  . Number of children: Not on file  . Years of education: Not on file  . Highest education level: Not on file  Occupational History  . Not on file  Tobacco Use  . Smoking status: Never Smoker  . Smokeless tobacco: Never Used  Substance and Sexual Activity  . Alcohol use: Yes    Comment: occ, on weekends  . Drug use: No  . Sexual activity: Not on file  Other Topics Concern  . Not on file  Social History Narrative  . Not on file   Social Determinants of Health   Financial Resource Strain: Not on file  Food Insecurity: Not on file  Transportation Needs: Not on file  Physical Activity: Not on file  Stress: Not on file  Social Connections: Not on file   Family History  Problem Relation Age of Onset  . Cancer Mother   . Cancer Father    . Hypertension Sister   . Heart murmur Sister   . CVA Brother   . CVA Brother   . Hypertension Brother    Past Surgical History:  Procedure Laterality Date  . HERNIA REPAIR        Mardella Layman, MD 06/26/20 1728

## 2020-07-03 ENCOUNTER — Encounter: Payer: Self-pay | Admitting: Internal Medicine

## 2020-07-09 ENCOUNTER — Ambulatory Visit: Payer: Self-pay | Admitting: Orthopaedic Surgery

## 2020-07-10 ENCOUNTER — Encounter: Payer: Self-pay | Admitting: Internal Medicine

## 2020-07-10 ENCOUNTER — Telehealth: Payer: Self-pay

## 2020-07-15 ENCOUNTER — Ambulatory Visit: Payer: Self-pay | Admitting: Orthopaedic Surgery

## 2020-08-20 ENCOUNTER — Encounter: Payer: Self-pay | Admitting: Orthopaedic Surgery

## 2020-08-20 ENCOUNTER — Ambulatory Visit (INDEPENDENT_AMBULATORY_CARE_PROVIDER_SITE_OTHER): Payer: Worker's Compensation | Admitting: Orthopaedic Surgery

## 2020-08-20 VITALS — BP 196/111 | HR 76 | Ht 66.0 in | Wt 140.0 lb

## 2020-08-20 DIAGNOSIS — M5126 Other intervertebral disc displacement, lumbar region: Secondary | ICD-10-CM | POA: Diagnosis not present

## 2020-08-20 DIAGNOSIS — M48061 Spinal stenosis, lumbar region without neurogenic claudication: Secondary | ICD-10-CM

## 2020-08-20 NOTE — Progress Notes (Signed)
Office Visit Note   Patient: Peter Bell           Date of Birth: 1970-09-02           MRN: [id number] Visit Date: 08/20/2020              Requested by: No referring provider defined for this encounter. PCP: Patient, No Pcp Per (Inactive)   Assessment & Plan: Visit Diagnoses:  1. Protrusion of lumbar intervertebral disc   2. Spinal stenosis of lumbar region without neurogenic claudication     Plan: Patient is in full employment states he is working at a different place and does not want to proceed with surgery.  Based on the disc herniation of the lumbar spine with pre-existing spinal stenosis at a rate his impairment at 5% of the back.  Patient is at [facility].  We have discussed surgery in the past and he states he would like to avoid surgery.  Patient is rated and released today. Anabel Bene, RN,CCM  Follow-Up Instructions: No follow-ups on file.   Orders:  No orders of the defined types were placed in this encounter.  No orders of the defined types were placed in this encounter.     Procedures: No procedures performed   Clinical Data: No additional findings.   Subjective: Chief Complaint  Patient presents with  . Lower Back - Pain    HPI 50 year old male returns for follow-up of on-the-job injury.  He has had MRI scan at [facility] which showed pre-existing spinal stenosis and patient also had large disc herniation causing compression.  We had discussed with him in the past recommendations for lumbar fusion surgery and decompression and patient had decided he did not want to have lumbar surgery.  Patient is working at a different company currently full-time employment.  We have reviewed the scan with him in the past and discussed with them treatment options and he states he is fully aware of his choices.  Patient is here with his rehab nurse Anabel Bene RN,CCM. Fax [phone] Patient continues to have pain in both legs worse more in his hamstring  region. Review of Systems updated unchanged.   Objective: Vital Signs: BP (!) 196/111   Pulse 76   Ht 5\' 6"  (1.676 m)   Wt 140 lb (63.5 kg)   BMI 22.60 kg/m   Physical Exam Constitutional:      Appearance: He is well-developed.  HENT:     Head: Normocephalic and atraumatic.  Eyes:     Pupils: Pupils are equal, round, and reactive to light.  Neck:     Thyroid: No thyromegaly.     Trachea: No tracheal deviation.  Cardiovascular:     Rate and Rhythm: Normal rate.  Pulmonary:     Effort: Pulmonary effort is normal.     Breath sounds: No wheezing.  Abdominal:     General: Bowel sounds are normal.     Palpations: Abdomen is soft.  Skin:    General: Skin is warm and dry.     Capillary Refill: Capillary refill takes less than 2 seconds.  Neurological:     Mental Status: He is alert and oriented to person, place, and time.  Psychiatric:        Behavior: Behavior normal.        Thought Content: Thought content normal.        Judgment: Judgment normal.     Ortho Exam patient is amatory.  He has pain with straight leg raising  on the left 70 degrees.  He can heel and toe walk.  Specialty Comments:  No specialty comments available.  Imaging: No results found.   PMFS History: Patient Active Problem List   Diagnosis Date Noted  . Spinal stenosis of lumbar region 04/22/2020  . Acute pain of left knee 09/17/2019  . Protrusion of lumbar intervertebral disc 05/29/2019  . SOB (shortness of breath) 05/10/2013  . Essential hypertension, benign 04/24/2013  . Chest pain, unspecified 04/24/2013   Past Medical History:  Diagnosis Date  . Asthma   . Diabetes mellitus without complication (HCC)   . Eczema   . GERD (gastroesophageal reflux disease)   . Hypertension     Family History  Problem Relation Age of Onset  . Cancer Mother   . Cancer Father   . Hypertension Sister   . Heart murmur Sister   . CVA Brother   . CVA Brother   . Hypertension Brother     Past  Surgical History:  Procedure Laterality Date  . HERNIA REPAIR     Social History   Occupational History  . Not on file  Tobacco Use  . Smoking status: Never Smoker  . Smokeless tobacco: Never Used  Substance and Sexual Activity  . Alcohol use: Yes    Comment: occ, on weekends  . Drug use: No  . Sexual activity: Not on file

## 2020-09-05 ENCOUNTER — Emergency Department (HOSPITAL_COMMUNITY)
Admission: EM | Admit: 2020-09-05 | Discharge: 2020-09-05 | Disposition: A | Discharge status: Home / Self Care | Payer: Self-pay | Attending: Emergency Medicine | Admitting: Emergency Medicine

## 2020-09-05 ENCOUNTER — Emergency Department (HOSPITAL_COMMUNITY): Payer: Self-pay

## 2020-09-05 ENCOUNTER — Other Ambulatory Visit: Payer: Self-pay

## 2020-09-05 DIAGNOSIS — R059 Cough, unspecified: Secondary | ICD-10-CM | POA: Insufficient documentation

## 2020-09-05 DIAGNOSIS — R0981 Nasal congestion: Secondary | ICD-10-CM | POA: Insufficient documentation

## 2020-09-05 DIAGNOSIS — Z5321 Procedure and treatment not carried out due to patient leaving prior to being seen by health care provider: Secondary | ICD-10-CM | POA: Insufficient documentation

## 2020-09-05 DIAGNOSIS — R519 Headache, unspecified: Secondary | ICD-10-CM | POA: Insufficient documentation

## 2020-09-05 DIAGNOSIS — Z20822 Contact with and (suspected) exposure to covid-19: Secondary | ICD-10-CM | POA: Insufficient documentation

## 2020-09-05 LAB — RESP PANEL BY RT-PCR (FLU A&B, COVID) ARPGX2
Influenza A by PCR: NEGATIVE
Influenza B by PCR: NEGATIVE
SARS Coronavirus 2 by RT PCR: NEGATIVE

## 2020-09-05 NOTE — ED Provider Notes (Signed)
  Emergency Medicine Provider in Triage Note   MSE was initiated and I personally evaluated the patient  2:00 AM on Sep 05, 2020 as provider in triage.   Chief Complaint: URI sxs  HPI  Patient is a 50 y.o. who presents to the ED with complaints of cold sxs. States he is having congestion, cough, and headache. No alleviating/aggravating factors. Has received covid 19 vaccine.      Review of Systems  Positive: Cough, congestion, headache Negative: Fever, chest pain, dyspnea, N/V/D  Physical Exam  BP (!) 176/102 (BP Location: Right Arm)   Pulse 100   Temp 98.6 F (37 C) (Oral)   Resp 16   SpO2 98%    Gen:   Awake, no distress   HEENT:  Atraumatic, nasal congestion present.  Resp:  Normal effort  Cardiac:  Normal rate  Abd:   Nondistended, nontender  MSK:   Moves extremities without difficulty  Neuro:  Speech clear   Medical Decision Making   Initiation of care has begun. The patient has been counseled on the process, plan, and necessity for staying for the completion/evaluation, informed that the remainder of the evaluation will be completed by another provider, this initial triage assessment does not replace that evaluation, and the importance of remaining in the ED until their evaluation is complete.  Clinical Impression  Congestion.         Desmond Lope 09/05/20 0209    Nira Conn, MD 09/08/20 2109

## 2020-09-05 NOTE — ED Triage Notes (Signed)
Pt c/o headache, nasal congestion and cough. Pt states this started earlier today and has been intermittent. Pt denies nausea or vomiting.

## 2020-09-05 NOTE — ED Notes (Signed)
Pt was did not respond for updated vitals

## 2020-10-21 ENCOUNTER — Ambulatory Visit: Payer: Self-pay | Admitting: Orthopaedic Surgery

## 2021-01-30 ENCOUNTER — Other Ambulatory Visit: Payer: Self-pay

## 2021-01-30 ENCOUNTER — Ambulatory Visit (HOSPITAL_COMMUNITY)
Admission: EM | Admit: 2021-01-30 | Discharge: 2021-01-30 | Disposition: A | Discharge status: Home / Self Care | Payer: Self-pay

## 2021-01-30 ENCOUNTER — Encounter (HOSPITAL_COMMUNITY): Payer: Self-pay | Admitting: Emergency Medicine

## 2021-01-30 DIAGNOSIS — I160 Hypertensive urgency: Secondary | ICD-10-CM

## 2021-01-30 DIAGNOSIS — I10 Essential (primary) hypertension: Secondary | ICD-10-CM

## 2021-01-30 MED ORDER — AMLODIPINE BESYLATE 5 MG PO TABS: 5.0000 mg | ORAL_TABLET | Freq: Every day | ORAL | 0 refills | Status: DC

## 2021-01-30 NOTE — Discharge Instructions (Addendum)
-  Start the amlodipine, 5 mg daily for 30 days.  Follow-up with [place] or your new primary care doctor within 30 days for refill/increasing the dose. -I placed a primary care referral for you, someone should call you to set this up in about a week. -If possible, please check your blood pressure at home or at the pharmacy. If this continues to be >140/90, follow-up with your primary care provider for further blood pressure management/ medication titration. If you develop chest pain, shortness of breath, vision changes, the worst headache of your life- head straight to the ED or call 911.

## 2021-01-30 NOTE — ED Triage Notes (Signed)
Patient c/o hypertension that occurred today.   Patient endorses " I went to the dentist and they said my blood pressure was too high".   Patient endorses a BP of 210/110.   Patient denies Chest pain, SOB, headache, and dizziness.   Patient is currently not taking any medications, patient states " I didn't have insurance and I need a primary care doctor".

## 2021-01-30 NOTE — ED Provider Notes (Signed)
[facility]    CSN: [id number] Arrival date & time: 01/30/21  5885      History   Chief Complaint Chief Complaint  Patient presents with   Hypertension    HPI Peter Bell is a 50 y.o. male presenting with hypertension.  Medical history hypertension, however he has been off of his medications for years due to losing insurance and then not having a primary care doctor.  States he does have insurance again and is wishing to establish care with a PCP.  He is here today because he went to the dentist 2 days ago and they said the blood pressure was 210/110.  Denies any symptoms including headaches, vision changes, weakness, chest pain, shortness of breath.  Feeling well today.  HPI  Past Medical History:  Diagnosis Date   Asthma    Diabetes mellitus without complication (HCC)    Eczema    GERD (gastroesophageal reflux disease)    Hypertension     Patient Active Problem List   Diagnosis Date Noted   Spinal stenosis of lumbar region 04/22/2020   Acute pain of left knee 09/17/2019   Protrusion of lumbar intervertebral disc 05/29/2019   SOB (shortness of breath) 05/10/2013   Essential hypertension, benign 04/24/2013   Chest pain, unspecified 04/24/2013    Past Surgical History:  Procedure Laterality Date   HERNIA REPAIR         Home Medications    Prior to Admission medications   Medication Sig Start Date End Date Taking? Authorizing Provider  amLODipine (NORVASC) 5 MG tablet Take 1 tablet (5 mg total) by mouth daily. 01/30/21  Yes Rhys Martini, PA-C  esomeprazole (NEXIUM) 20 MG capsule Take 20 mg by mouth daily at 12 noon.   Yes [provider]  hydrochlorothiazide (HYDRODIURIL) 25 MG tablet Take 25 mg by mouth daily.    [provider]  losartan (COZAAR) 50 MG tablet Take 1 tablet (50 mg total) by mouth daily. 06/26/20   Mardella Layman, MD  insulin glargine (LANTUS) 100 UNIT/ML injection Inject 16 Units into the skin daily.  04/14/19   [provider]  metFORMIN (GLUCOPHAGE) 500 MG tablet Take 500 mg by mouth.  04/14/19  [provider]  pantoprazole (PROTONIX) 40 MG tablet Take 40 mg by mouth daily.  04/14/19  [provider]  pravastatin (PRAVACHOL) 40 MG tablet Take 40 mg by mouth daily.  04/14/19  [provider]    Family History Family History  Problem Relation Age of Onset   Cancer Mother    Cancer Father    Hypertension Sister    Heart murmur Sister    CVA Brother    CVA Brother    Hypertension Brother     Social History Social History   Tobacco Use   Smoking status: Never   Smokeless tobacco: Never  Substance Use Topics   Alcohol use: Yes    Comment: occ, on weekends   Drug use: No     Allergies   Patient has no known allergies.   Review of Systems Review of Systems  Respiratory:  Negative for shortness of breath.   Cardiovascular:  Negative for chest pain.  All other systems reviewed and are negative.   Physical Exam Triage Vital Signs ED Triage Vitals  Enc Vitals Group     BP 01/30/21 0957 (!) 209/103     Pulse Rate 01/30/21 0957 72     Resp 01/30/21 0957 12     Temp  01/30/21 0957 98.8 F (37.1 C)     Temp Source 01/30/21 0957 Oral     SpO2 01/30/21 0957 98 %     Weight --      Height --      Head Circumference --      Peak Flow --      Pain Score 01/30/21 0955 0     Pain Loc --      Pain Edu? --      Excl. in [place]? --    No data found.  Updated Vital Signs BP (!) 209/103 (BP Location: Right Arm)   Pulse 72   Temp 98.8 F (37.1 C) (Oral)   Resp 12   SpO2 98%   Visual Acuity Right Eye Distance:   Left Eye Distance:   Bilateral Distance:    Right Eye Near:   Left Eye Near:    Bilateral Near:     Physical Exam Vitals reviewed.  Constitutional:      Appearance: Normal appearance. He is not diaphoretic.  HENT:     Head: Normocephalic and atraumatic.     Mouth/Throat:     Mouth: Mucous membranes are moist.  Eyes:      Extraocular Movements: Extraocular movements intact.     Pupils: Pupils are equal, round, and reactive to light.  Cardiovascular:     Rate and Rhythm: Normal rate and regular rhythm.     Pulses:          Radial pulses are 2+ on the right side and 2+ on the left side.     Heart sounds: Normal heart sounds.  Pulmonary:     Effort: Pulmonary effort is normal.     Breath sounds: Normal breath sounds.  Abdominal:     Palpations: Abdomen is soft.     Tenderness: There is no abdominal tenderness. There is no guarding or rebound.  Musculoskeletal:     Right lower leg: No edema.     Left lower leg: No edema.  Skin:    General: Skin is warm.     Capillary Refill: Capillary refill takes less than 2 seconds.  Neurological:     General: No focal deficit present.     Mental Status: He is alert and oriented to person, place, and time.  Psychiatric:        Mood and Affect: Mood normal.        Behavior: Behavior normal.        Thought Content: Thought content normal.        Judgment: Judgment normal.     UC Treatments / Results  Labs (all labs ordered are listed, but only abnormal results are displayed) Labs Reviewed - No data to display  EKG   Radiology No results found.  Procedures Procedures (including critical care time)  Medications Ordered in UC Medications - No data to display  Initial Impression / Assessment and Plan / UC Course  I have reviewed the triage vital signs and the nursing notes.  Pertinent labs & imaging results that were available during my care of the patient were reviewed by me and considered in my medical decision making (see chart for details).     This patient is a very pleasant 50 y.o. year old male presenting with hypertensive urgency. BP 209/103. This patient was on amlodipine and hydrochlorothiazide for years, he discontinued these due to losing insurance and not having a primary care doctor.  We will start with amlodipine 5 mg once daily today,  and  primary care referral sent.  He will follow-up with [place] for titration in 1 month if he cannot establish with primary care in that time.  He adamantly denies headaches, vision changes, chest pain, shortness of breath, dizziness today.  Strict ED return precautions discussed. Patient verbalizes understanding and agreement.   Coding this visit a level 4 for acute exacerbation of chronic condition and prescription drug management.  Final Clinical Impressions(s) / UC Diagnoses   Final diagnoses:  Hypertensive urgency     Discharge Instructions      -Start the amlodipine, 5 mg daily for 30 days.  Follow-up with [place] or your new primary care doctor within 30 days for refill/increasing the dose. -I placed a primary care referral for you, someone should call you to set this up in about a week. -If possible, please check your blood pressure at home or at the pharmacy. If this continues to be >140/90, follow-up with your primary care provider for further blood pressure management/ medication titration. If you develop chest pain, shortness of breath, vision changes, the worst headache of your life- head straight to the ED or call 911.    ED Prescriptions     Medication Sig Dispense Auth. Provider   amLODipine (NORVASC) 5 MG tablet Take 1 tablet (5 mg total) by mouth daily. 30 tablet Rhys Martini, PA-C      PDMP not reviewed this encounter.   Rhys Martini, PA-C 01/30/21 1118

## 2021-06-12 ENCOUNTER — Encounter (HOSPITAL_COMMUNITY): Payer: Self-pay

## 2021-06-12 ENCOUNTER — Emergency Department (HOSPITAL_COMMUNITY)
Admission: EM | Admit: 2021-06-12 | Discharge: 2021-06-12 | Disposition: A | Discharge status: Home / Self Care | Payer: Self-pay | Attending: Emergency Medicine | Admitting: Emergency Medicine

## 2021-06-12 ENCOUNTER — Ambulatory Visit (HOSPITAL_COMMUNITY)
Admission: EM | Admit: 2021-06-12 | Discharge: 2021-06-12 | Payer: Self-pay | Attending: Family Medicine | Admitting: Family Medicine

## 2021-06-12 DIAGNOSIS — I10 Essential (primary) hypertension: Secondary | ICD-10-CM | POA: Insufficient documentation

## 2021-06-12 DIAGNOSIS — I160 Hypertensive urgency: Secondary | ICD-10-CM

## 2021-06-12 DIAGNOSIS — Z5321 Procedure and treatment not carried out due to patient leaving prior to being seen by health care provider: Secondary | ICD-10-CM | POA: Insufficient documentation

## 2021-06-12 MED ORDER — CLONIDINE HCL 0.1 MG PO TABS
ORAL_TABLET | ORAL | Status: AC
  Filled 2021-06-12: qty 1

## 2021-06-12 MED ORDER — CLONIDINE HCL 0.1 MG PO TABS
0.1000 mg | ORAL_TABLET | Freq: Once | ORAL | Status: AC
Start: 2021-06-12 — End: 2021-06-12
  Administered 2021-06-12: 0.1 mg via ORAL

## 2021-06-12 NOTE — ED Provider Notes (Signed)
[place]    CSN: [id number] Arrival date & time: 06/12/21  1430      History   Chief Complaint Chief Complaint  Patient presents with   Hypertension   Headache   Dizziness    HPI Peter Bell is a 51 y.o. male.    Hypertension Associated symptoms include headaches.  Headache Associated symptoms: dizziness   Dizziness Associated symptoms: headaches   Here for some headache that actually he is not having at the current time.  He states he knows he needs his blood pressure medicines refilled.  He has not been taking them in some time.  He has had a little headache off and on.  He has also had a little vertigo that strikes him sometimes for 5 or 10 seconds.  He is not having it at this very moment that we had it earlier today.  No chest pain and no dyspnea.  No visual changes.  BP here is 224/116 and 209/104.  Past Medical History:  Diagnosis Date   Asthma    Diabetes mellitus without complication ([place])    Eczema    GERD (gastroesophageal reflux disease)    Hypertension     Patient Active Problem List   Diagnosis Date Noted   Spinal stenosis of lumbar region 04/22/2020   Acute pain of left knee 09/17/2019   Protrusion of lumbar intervertebral disc 05/29/2019   SOB (shortness of breath) 05/10/2013   Essential hypertension, benign 04/24/2013   Chest pain, unspecified 04/24/2013    Past Surgical History:  Procedure Laterality Date   HERNIA REPAIR         Home Medications    Prior to Admission medications   Medication Sig Start Date End Date Taking? Authorizing Provider  amLODipine (NORVASC) 5 MG tablet Take 1 tablet (5 mg total) by mouth daily. 01/30/21   Hazel Sams, PA-C  esomeprazole (NEXIUM) 20 MG capsule Take 20 mg by mouth daily at 12 noon.    [provider]  hydrochlorothiazide (HYDRODIURIL) 25 MG tablet Take 25 mg by mouth daily.    [provider]  losartan (COZAAR) 50 MG tablet Take 1 tablet (50 mg  total) by mouth daily. 06/26/20   Vanessa Kick, MD  insulin glargine (LANTUS) 100 UNIT/ML injection Inject 16 Units into the skin daily.  04/14/19  [provider]  metFORMIN (GLUCOPHAGE) 500 MG tablet Take 500 mg by mouth.  04/14/19  [provider]  pantoprazole (PROTONIX) 40 MG tablet Take 40 mg by mouth daily.  04/14/19  [provider]  pravastatin (PRAVACHOL) 40 MG tablet Take 40 mg by mouth daily.  04/14/19  [provider]    Family History Family History  Problem Relation Age of Onset   Cancer Mother    Cancer Father    Hypertension Sister    Heart murmur Sister    CVA Brother    CVA Brother    Hypertension Brother     Social History Social History   Tobacco Use   Smoking status: Never   Smokeless tobacco: Never  Substance Use Topics   Alcohol use: Yes    Comment: occ, on weekends   Drug use: No     Allergies   Patient has no known allergies.   Review of Systems Review of Systems  Neurological:  Positive for dizziness and headaches.    Physical Exam Triage Vital Signs ED Triage Vitals [06/12/21 1609]  Enc Vitals Group     BP (!) 209/104  Pulse Rate 74     Resp 18     Temp 98.3 F (36.8 C)     Temp Source Oral     SpO2 100 %     Weight      Height      Head Circumference      Peak Flow      Pain Score 4     Pain Loc      Pain Edu?      Excl. in [place]?    No data found.  Updated Vital Signs BP (!) 232/146 (BP Location: Right Arm)    Pulse 74    Temp 98.3 F (36.8 C) (Oral)    Resp 18    SpO2 100%   Visual Acuity Right Eye Distance:   Left Eye Distance:   Bilateral Distance:    Right Eye Near:   Left Eye Near:    Bilateral Near:     Physical Exam Constitutional:      General: He is not in acute distress.    Appearance: He is not toxic-appearing.  HENT:     Nose: Nose normal.     Mouth/Throat:     Mouth: Mucous membranes are moist.     Pharynx: No oropharyngeal exudate or posterior  oropharyngeal erythema.  Eyes:     Extraocular Movements: Extraocular movements intact.     Pupils: Pupils are equal, round, and reactive to light.  Cardiovascular:     Rate and Rhythm: Normal rate and regular rhythm.     Heart sounds: No murmur heard. Pulmonary:     Breath sounds: Normal breath sounds. No wheezing, rhonchi or rales.  Musculoskeletal:     Cervical back: Neck supple.  Skin:    Capillary Refill: Capillary refill takes less than 2 seconds.     Coloration: Skin is not jaundiced or pale.  Neurological:     Mental Status: He is alert and oriented to person, place, and time.  Psychiatric:        Behavior: Behavior normal.     UC Treatments / Results  Labs (all labs ordered are listed, but only abnormal results are displayed) Labs Reviewed - No data to display  EKG   Radiology No results found.  Procedures Procedures (including critical care time)  Medications Ordered in UC Medications  cloNIDine (CATAPRES) tablet 0.1 mg (0.1 mg Oral Given 06/12/21 1614)    Initial Impression / Assessment and Plan / UC Course  I have reviewed the triage vital signs and the nursing notes.  Pertinent labs & imaging results that were available during my care of the patient were reviewed by me and considered in my medical decision making (see chart for details).     We will try to get it to fall some with a clonidine po.  1 hour later his blood pressures are absolutely unchanged.  I have asked him to proceed to the emergency room for further evaluation and treatment Final Clinical Impressions(s) / UC Diagnoses   Final diagnoses:  Hypertensive urgency     Discharge Instructions      Please proceed to the ER for further evaluation and treatment to lower your blood pressure     ED Prescriptions   None    PDMP not reviewed this encounter.   Barrett Henle, MD 06/12/21 [phone]

## 2021-06-12 NOTE — ED Notes (Signed)
Patient is being discharged from the Urgent Care and sent to the Emergency Department via personal vehicle . Per Dr Marlinda Mike, patient is in need of higher level of care due to hypertensive emergency. Patient is aware and verbalizes understanding of plan of care.  Vitals:   06/12/21 1713 06/12/21 1713  BP: (!) 227/130 (!) 232/146  Pulse:    Resp:    Temp:    SpO2:

## 2021-06-12 NOTE — Discharge Instructions (Addendum)
Please proceed to the ER for further evaluation and treatment to lower your blood pressure

## 2021-06-12 NOTE — ED Triage Notes (Signed)
Pt presents with elevated blood pressure for unknown amount of time; pt states he has not had blood pressure medication in a few months.  Pt states he has had intermittent headaches and vertigo.

## 2021-06-12 NOTE — ED Triage Notes (Signed)
Pt arrives POV for eval of HTN. Pt reports hx of HTN and noncompliance w/ meds. Pt reports no sx of htn, states "something just told me to go get it checked".

## 2021-06-12 NOTE — ED Notes (Signed)
PT called X2 for MSE with no response.

## 2021-06-19 ENCOUNTER — Other Ambulatory Visit (HOSPITAL_BASED_OUTPATIENT_CLINIC_OR_DEPARTMENT_OTHER): Payer: Self-pay

## 2021-06-19 ENCOUNTER — Other Ambulatory Visit: Payer: Self-pay

## 2021-06-19 ENCOUNTER — Emergency Department (HOSPITAL_BASED_OUTPATIENT_CLINIC_OR_DEPARTMENT_OTHER)
Admission: EM | Admit: 2021-06-19 | Discharge: 2021-06-19 | Disposition: A | Discharge status: Home / Self Care | Payer: Self-pay | Attending: Emergency Medicine | Admitting: Emergency Medicine

## 2021-06-19 ENCOUNTER — Encounter (HOSPITAL_BASED_OUTPATIENT_CLINIC_OR_DEPARTMENT_OTHER): Payer: Self-pay | Admitting: *Deleted

## 2021-06-19 DIAGNOSIS — I160 Hypertensive urgency: Secondary | ICD-10-CM | POA: Insufficient documentation

## 2021-06-19 DIAGNOSIS — Z79899 Other long term (current) drug therapy: Secondary | ICD-10-CM | POA: Insufficient documentation

## 2021-06-19 MED ORDER — AMLODIPINE BESYLATE 5 MG PO TABS
5.0000 mg | ORAL_TABLET | Freq: Once | ORAL | Status: AC
  Administered 2021-06-19: 5 mg via ORAL
  Filled 2021-06-19: qty 1

## 2021-06-19 MED ORDER — HYDROCHLOROTHIAZIDE 25 MG PO TABS
25.0000 mg | ORAL_TABLET | Freq: Once | ORAL | Status: AC
Start: 2021-06-19 — End: 2021-06-19
  Administered 2021-06-19: 25 mg via ORAL
  Filled 2021-06-19: qty 1

## 2021-06-19 MED ORDER — LOSARTAN POTASSIUM 50 MG PO TABS
50.0000 mg | ORAL_TABLET | Freq: Every day | ORAL | 0 refills | Status: DC
  Filled 2021-06-19: qty 30, 30d supply, fill #0

## 2021-06-19 MED ORDER — AMLODIPINE BESYLATE 5 MG PO TABS
5.0000 mg | ORAL_TABLET | Freq: Every day | ORAL | 0 refills | Status: DC
  Filled 2021-06-19: qty 30, 30d supply, fill #0

## 2021-06-19 NOTE — Discharge Instructions (Addendum)
Please call [facility] to establish care with a primary care doctor.  Return to the emergency department for worsening symptoms.  I have sent your prescriptions to your pharmacy.

## 2021-06-19 NOTE — ED Provider Notes (Signed)
[facility] Provider Note   CSN: [id number] Arrival date & time: 06/19/21  1148     History Chief Complaint  Patient presents with   Hypertension    Peter Bell is a 51 y.o. male with longstanding history of hypertension who presents to the emergency department today with elevated blood pressures.  Patient states has been out of his blood pressure medication over the last month.  Last time his refill was at urgent care a month ago.  Patient does not recall what medications he takes.  Old records were reviewed which showed that he takes 5 mg of amlodipine, 50 mg of losartan, and 25 mg of hydrochlorothiazide.  Patient states he only takes 2 medications.  Patient does not have primary care follow-up.  He denies any chest pain, headache, dizziness, shortness of breath, hematuria, abdominal pain, nausea, vomiting, diarrhea, focal weakness or numbness.   Hypertension      Home Medications Prior to Admission medications   Medication Sig Start Date End Date Taking? Authorizing Provider  amLODipine (NORVASC) 5 MG tablet Take 1 tablet (5 mg total) by mouth daily. 06/19/21  Yes Raul Del, Dairon Procter M, PA-C  losartan (COZAAR) 50 MG tablet Take 1 tablet (50 mg total) by mouth daily. 06/19/21  Yes Raul Del, Shreena Baines M, PA-C  esomeprazole (NEXIUM) 20 MG capsule Take 20 mg by mouth daily at 12 noon.    [provider]  hydrochlorothiazide (HYDRODIURIL) 25 MG tablet Take 25 mg by mouth daily.    [provider]  insulin glargine (LANTUS) 100 UNIT/ML injection Inject 16 Units into the skin daily.  04/14/19  [provider]  metFORMIN (GLUCOPHAGE) 500 MG tablet Take 500 mg by mouth.  04/14/19  [provider]  pantoprazole (PROTONIX) 40 MG tablet Take 40 mg by mouth daily.  04/14/19  [provider]  pravastatin (PRAVACHOL) 40 MG tablet Take 40 mg by mouth daily.  04/14/19  [provider]      Allergies    Patient has no  known allergies.    Review of Systems   Review of Systems  All other systems reviewed and are negative.  Physical Exam Updated Vital Signs BP (!) 182/99    Pulse 80    Temp 98 F (36.7 C) (Oral)    Resp 17    SpO2 100%  Physical Exam Vitals and nursing note reviewed.  Constitutional:      General: He is not in acute distress.    Appearance: Normal appearance.  HENT:     Head: Normocephalic and atraumatic.  Eyes:     General:        Right eye: No discharge.        Left eye: No discharge.  Cardiovascular:     Comments: Regular rate and rhythm.  S1/S2 are distinct without any evidence of murmur, rubs, or gallops.  Radial pulses are 2+ bilaterally.  Dorsalis pedis pulses are 2+ bilaterally.  No evidence of pedal edema. Pulmonary:     Comments: Clear to auscultation bilaterally.  Normal effort.  No respiratory distress.  No evidence of wheezes, rales, or rhonchi heard throughout. Abdominal:     General: Abdomen is flat. Bowel sounds are normal. There is no distension.     Tenderness: There is no abdominal tenderness. There is no guarding or rebound.  Musculoskeletal:        General: Normal range of motion.     Cervical back: Neck supple.  Skin:    General: Skin  is warm and dry.     Findings: No rash.  Neurological:     General: No focal deficit present.     Mental Status: He is alert.     Comments: Cranial nerves II through XII are intact.  5/5 strength to the upper and lower extremities.  Normal sensation to the upper and lower extremities.  No dysmetria on finger-nose.  No pronator drift.  No facial droop.  Talking in complete sentences.  Alert and oriented x3.  Psychiatric:        Mood and Affect: Mood normal.        Behavior: Behavior normal.    ED Results / Procedures / Treatments   Labs (all labs ordered are listed, but only abnormal results are displayed) Labs Reviewed - No data to display  EKG EKG Interpretation  Date/Time:  Friday June 19 2021 12:14:11  EST Ventricular Rate:  70 PR Interval:  156 QRS Duration: 76 QT Interval:  404 QTC Calculation: 436 R Axis:   -23 Text Interpretation: Normal sinus rhythm Left ventricular hypertrophy with repolarization abnormality ( Sokolow-Lyon , Cornell product , Romhilt-Estes ) No significant change since last tracing When compared with ECG of 13-Apr-2020 21:40, No significant change was found Confirmed by Blanchie Dessert [phone]) on 06/19/2021 1:08:38 PM  Radiology No results found.  Procedures Procedures   Medications Ordered in ED Medications  amLODipine (NORVASC) tablet 5 mg (has no administration in time range)  hydrochlorothiazide (HYDRODIURIL) tablet 25 mg (has no administration in time range)    ED Course/ Medical Decision Making/ A&P                           Medical Decision Making Risk Prescription drug management.   This patient presents to the ED for concern of elevated blood pressures, this involves an extensive number of treatment options, and is a complaint that carries with it a high risk of complications and morbidity.  The differential diagnosis includes hypertensive urgency versus emergency.   Co morbidities that complicate the patient evaluation  Hypertension   Additional history obtained:  Additional history obtained from old records External records from outside source obtained and reviewed including previous ED visits.  Recent ED visit showed this is a ongoing problem.  Patient will take his medications right out and then not take it for some time and then come to the emergency department to get them refilled.  He does not have primary care follow-up.   Cardiac Monitoring:  The patient was maintained on a cardiac monitor.  I personally viewed and interpreted the cardiac monitored which showed an underlying rhythm of: Elevated blood pressures.  He is in normal sinus rhythm.   Medicines ordered and prescription drug management:  I ordered medication  including hydrochlorothiazide and amlodipine for elevated blood pressures Reevaluation of the patient after these medicines showed that the patient improved I have reviewed the patients home medicines and have made adjustments as needed   Test Considered:  CBC, BMP, troponin, chest x-ray.  I do feel that working him up for hypertensive urgency is of benefit this time.  He is completely asymptomatic within normal physical exam.  By definition he is within criteria for hypertensive urgency.   Critical Interventions:  At home blood pressure medications for blood pressure control   Problem List / ED Course:  Hypertension.  This seems to be an ongoing problem for the patient I educated the patient at the bedside that he  needs to have a primary care doctor to keep up with his blood pressure and refill a prescription.  I will give him a 30-day supply of his blood pressure medications and give him follow-up with [facility].  I educated him on the long-term effects of hypertension if he does not get his blood pressure under control.  Patient understood.  No clinical evidence of hypertensive emergency or endorgan damage at this time.  I have a low suspicion for CVA, ACS, kidney failure, liver failure, flash pulmonary edema.   Reevaluation:  After the interventions noted above, I reevaluated the patient and found that they have :improved   Social Determinants of Health:  Lack of primary care follow-up   Dispostion:  After consideration of the diagnostic results and the patients response to treatment, I feel that the patent would benefit from outpatient follow-up.  Blood pressure medications were given in the department today.  We will refill a 30 supply of his medications with the exception of hydrochlorothiazide.  Patient does not seem to have strong outpatient follow-up and I want to avoid the risk for profound hypokalemia.  Again, patient has no clinical  evidence of hypertensive emergency at this time.  He is safe for discharge.  Final Clinical Impression(s) / ED Diagnoses Final diagnoses:  Hypertensive urgency    Rx / DC Orders ED Discharge Orders          Ordered    amLODipine (NORVASC) 5 MG tablet  Daily        06/19/21 1327    losartan (COZAAR) 50 MG tablet  Daily        06/19/21 1327              Myna Bright [place], [place] 06/19/21 1328    Blanchie Dessert, MD 06/20/21 1437

## 2021-06-19 NOTE — ED Notes (Addendum)
Pt Peter Bell Blurry vision or pain. Pt states that he occassionally gets dizzy when he raises his head from looking at his phone. Pt Peter Bell SOB

## 2021-06-19 NOTE — ED Triage Notes (Signed)
Pt is here for HTN, he is out of his BP meds x one months.

## 2021-07-20 ENCOUNTER — Other Ambulatory Visit (HOSPITAL_COMMUNITY): Payer: Self-pay

## 2021-07-29 ENCOUNTER — Ambulatory Visit (HOSPITAL_COMMUNITY)
Admission: EM | Admit: 2021-07-29 | Discharge: 2021-07-29 | Disposition: A | Discharge status: Home / Self Care | Payer: Self-pay | Attending: Family Medicine | Admitting: Family Medicine

## 2021-07-29 ENCOUNTER — Encounter (HOSPITAL_COMMUNITY): Payer: Self-pay

## 2021-07-29 DIAGNOSIS — H8110 Benign paroxysmal vertigo, unspecified ear: Secondary | ICD-10-CM

## 2021-07-29 DIAGNOSIS — I10 Essential (primary) hypertension: Secondary | ICD-10-CM

## 2021-07-29 MED ORDER — MECLIZINE HCL 25 MG PO TABS: 25.0000 mg | ORAL_TABLET | Freq: Three times a day (TID) | ORAL | 0 refills | Status: DC | PRN

## 2021-07-29 MED ORDER — LOSARTAN POTASSIUM 50 MG PO TABS: 50.0000 mg | ORAL_TABLET | Freq: Every day | ORAL | Status: DC

## 2021-07-29 MED ORDER — HYDROCHLOROTHIAZIDE 25 MG PO TABS: 25.0000 mg | ORAL_TABLET | Freq: Every day | ORAL | 2 refills | Status: DC

## 2021-07-29 MED ORDER — AMLODIPINE BESYLATE 5 MG PO TABS: 5.0000 mg | ORAL_TABLET | Freq: Every day | ORAL | 2 refills | Status: DC

## 2021-07-29 NOTE — ED Triage Notes (Signed)
Pt presents today with dizziness for the past three days. Pt states he has not taken blood pressure medication for the past three days. ?

## 2021-08-11 NOTE — ED Provider Notes (Signed)
?[facility] ? ? ?[id number] ?07/29/21 Arrival Time: 1943 ? ?ASSESSMENT & PLAN: ? ?1. Benign paroxysmal positional vertigo, unspecified laterality   ?2. Elevated blood pressure reading in office with diagnosis of hypertension   ? ?No s/s of HTN urgency. ?Normal neurologic exam. No suspicion for ICH or SAH. No indication for neurodiagnostic imaging at this time. Discussed. ? ?Meds ordered this encounter  ?Medications  ? amLODipine (NORVASC) 5 MG tablet  ?  Sig: Take 1 tablet (5 mg total) by mouth daily.  ?  Dispense:  30 tablet  ?  Refill:  2  ? hydrochlorothiazide (HYDRODIURIL) 25 MG tablet  ?  Sig: Take 1 tablet (25 mg total) by mouth daily.  ?  Dispense:  30 tablet  ?  Refill:  2  ? losartan (COZAAR) 50 MG tablet  ?  Sig: Take 1 tablet (50 mg total) by mouth daily.  ?  Dispense:  30 tablet  ?  Refill:  02  ? meclizine (ANTIVERT) 25 MG tablet  ?  Sig: Take 1 tablet (25 mg total) by mouth 3 (three) times daily as needed for dizziness.  ?  Dispense:  30 tablet  ?  Refill:  0  ? ? ?Reassured that these symptoms do not appear to represent a serious or threatening condition. This is generally a self-limited temporary but uncomfortable situation. Rest, avoid potentially dangerous activities (such as driving or working with machinery or at heights). Use OTC Meclizine prn. Will proceed to the ED if he develops other symptoms such as alterations of speech, swallowing, vision, motor/sensory systems, or if dizziness worsens. ? ?Reviewed expectations re: course of current medical issues. Questions answered. ?Outlined signs and symptoms indicating need for more acute intervention. ?Patient verbalized understanding. ?After Visit Summary given. ? ? ?SUBJECTIVE: ? ?Peter Bell is a 51 y.o. male who reports abrupt onset of dizziness described as vertigo. Current symptoms first noted  a few days ago; questions related to elevated blood pressures. Off BP meds x 3 days.  Aggravating factors: head movements. Denies gait  problems, headaches, seizures, speech problems, tremors, and weakness as well as otalgia and tinnitus. Recent infections: none. Head trauma: denied. Noise exposure: no occupational exposure. No associated SOB, CP, or palpatations reported. Recent travel: none. Reports normal bowel/bladder habits. ?Previous workup/treatments: none. ?Therapies tried thus far: none. ? ?Social History  ? ?Substance and Sexual Activity  ?Alcohol Use Yes  ? Comment: occ, on weekends  ? ?Social History  ? ?Tobacco Use  ?Smoking Status Never  ?Smokeless Tobacco Never  ? ?Denies illegal drug use. ? ? ?OBJECTIVE: ? ?Vitals:  ? 07/29/21 1949  ?BP: (!) 166/99  ?Pulse: 62  ?Resp: 18  ?Temp: 98.3 ?F (36.8 ?C)  ?TempSrc: Oral  ?SpO2: 100%  ?  ?General appearance: alert; no distress ?Eyes: PERRLA; EOMI; conjunctiva normal ?HENT: normocephalic; atraumatic; TMs normal; nasal mucosa normal; oral mucosa normal ?Neck: supple with FROM ?Lungs: clear to auscultation bilaterally ?Heart: regular rate and rhythm ?Abdomen: soft, non-tender; bowel sounds normal ?Extremities: no cyanosis or edema; symmetrical with no gross deformities ?Skin: warm and dry ?Neurologic: normal gait; DTR's normal and symmetric; CN 2-12 grossly intact; rapid changes in position during the exam do precipitate brief vertigo ?Psychological: alert and cooperative; normal mood and affect ? ?No Known Allergies ? ?Past Medical History:  ?Diagnosis Date  ? Asthma   ? Diabetes mellitus without complication ([facility])   ? Eczema   ? GERD (gastroesophageal reflux disease)   ? Hypertension   ? ?  Social History  ? ?Socioeconomic History  ? Marital status: Divorced  ?  Spouse name: Not on file  ? Number of children: Not on file  ? Years of education: Not on file  ? Highest education level: Not on file  ?Occupational History  ? Not on file  ?Tobacco Use  ? Smoking status: Never  ? Smokeless tobacco: Never  ?Substance and Sexual Activity  ? Alcohol use: Yes  ?  Comment: occ, on weekends  ? Drug use: No   ? Sexual activity: Not on file  ?Other Topics Concern  ? Not on file  ?Social History Narrative  ? Not on file  ? ?Social Determinants of Health  ? ?Financial Resource Strain: Not on file  ?Food Insecurity: Not on file  ?Transportation Needs: Not on file  ?Physical Activity: Not on file  ?Stress: Not on file  ?Social Connections: Not on file  ?Intimate Partner Violence: Not on file  ? ?Family History  ?Problem Relation Age of Onset  ? Cancer Mother   ? Cancer Father   ? Hypertension Sister   ? Heart murmur Sister   ? CVA Brother   ? CVA Brother   ? Hypertension Brother   ? ?Past Surgical History:  ?Procedure Laterality Date  ? HERNIA REPAIR    ? ? ? ?  ?Vanessa Kick, MD ?08/11/21 [phone] ? ?
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
## 2021-12-09 ENCOUNTER — Ambulatory Visit (HOSPITAL_COMMUNITY)
Admission: EM | Admit: 2021-12-09 | Discharge: 2021-12-09 | Disposition: A | Discharge status: Home / Self Care | Payer: Self-pay | Attending: Emergency Medicine | Admitting: Emergency Medicine

## 2021-12-09 ENCOUNTER — Encounter (HOSPITAL_COMMUNITY): Payer: Self-pay

## 2021-12-09 DIAGNOSIS — M79605 Pain in left leg: Secondary | ICD-10-CM

## 2021-12-09 MED ORDER — CYCLOBENZAPRINE HCL 10 MG PO TABS: 10.0000 mg | ORAL_TABLET | Freq: Two times a day (BID) | ORAL | 0 refills | Status: DC | PRN

## 2021-12-09 MED ORDER — DICLOFENAC SODIUM 1 % EX GEL: 2.0000 g | Freq: Four times a day (QID) | CUTANEOUS | 1 refills | Status: DC

## 2021-12-09 MED ORDER — IBUPROFEN 800 MG PO TABS: 800.0000 mg | ORAL_TABLET | Freq: Three times a day (TID) | ORAL | 0 refills | Status: DC

## 2021-12-09 NOTE — Discharge Instructions (Addendum)
I recommend taking ibuprofen 800 mg three times daily, in combination with the muscle relaxer twice daily.  You can apply the Voltaren gel topically up to 4 times daily.  Please follow up with the orthopedic specialists.
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
## 2022-01-07 ENCOUNTER — Other Ambulatory Visit (HOSPITAL_COMMUNITY): Payer: Self-pay

## 2022-02-02 ENCOUNTER — Other Ambulatory Visit (HOSPITAL_COMMUNITY): Payer: Self-pay

## 2022-02-11 ENCOUNTER — Ambulatory Visit (HOSPITAL_COMMUNITY)
Admission: EM | Admit: 2022-02-11 | Discharge: 2022-02-11 | Disposition: A | Discharge status: Home / Self Care | Payer: Self-pay | Attending: Family Medicine | Admitting: Family Medicine

## 2022-02-11 DIAGNOSIS — I10 Essential (primary) hypertension: Secondary | ICD-10-CM

## 2022-02-11 LAB — BASIC METABOLIC PANEL
Anion gap: 14 (ref 5–15)
BUN: 7 mg/dL (ref 6–20)
CO2: 24 mmol/L (ref 22–32)
Calcium: 9.4 mg/dL (ref 8.9–10.3)
Chloride: 102 mmol/L (ref 98–111)
Creatinine, Ser: 0.95 mg/dL (ref 0.61–1.24)
GFR, Estimated: >60 mL/min (ref >60)
Glucose, Bld: 142 mg/dL — ABNORMAL HIGH (ref 70–99)
Potassium: 3.9 mmol/L (ref 3.5–5.1)
Sodium: 140 mmol/L (ref 135–145)

## 2022-02-11 MED ORDER — LOSARTAN POTASSIUM 50 MG PO TABS: 50.0000 mg | ORAL_TABLET | Freq: Every day | ORAL | 0 refills | Status: DC

## 2022-02-11 MED ORDER — AMLODIPINE BESYLATE 5 MG PO TABS: 5.0000 mg | ORAL_TABLET | Freq: Every day | ORAL | 0 refills | Status: DC

## 2022-02-11 MED ORDER — HYDROCHLOROTHIAZIDE 25 MG PO TABS: 25.0000 mg | ORAL_TABLET | Freq: Every day | ORAL | 0 refills | Status: DC

## 2022-02-11 NOTE — ED Provider Notes (Addendum)
[place]    CSN: [id number] Arrival date & time: 02/11/22  1805      History   Chief Complaint Chief Complaint  Patient presents with   Gait Problem   Hypertension   Diabetes    HPI Peter Bell is a 51 y.o. male.    Hypertension  Diabetes   Here for elevated blood pressure.  He has not been taking his medicines in the last 3 weeks.  He also has diabetes and has not taken his metformin in a while.  He has been coming here for the last year or so to get refills on his medicines.  He states he is about to have his insurance cards and plans on making a PCP appointment.  3 weeks ago he awoke and found that one of his legs is not working well and he figures he has had a stroke.  No syncope, no dysarthria and no upper extremity weakness  Past Medical History:  Diagnosis Date   Asthma    Diabetes mellitus without complication ([place])    Eczema    GERD (gastroesophageal reflux disease)    Hypertension     Patient Active Problem List   Diagnosis Date Noted   Spinal stenosis of lumbar region 04/22/2020   Acute pain of left knee 09/17/2019   Protrusion of lumbar intervertebral disc 05/29/2019   SOB (shortness of breath) 05/10/2013   Essential hypertension, benign 04/24/2013   Chest pain, unspecified 04/24/2013    Past Surgical History:  Procedure Laterality Date   HERNIA REPAIR         Home Medications    Prior to Admission medications   Medication Sig Start Date End Date Taking? Authorizing Provider  amLODipine (NORVASC) 5 MG tablet Take 1 tablet (5 mg total) by mouth daily. 02/11/22   Barrett Henle, MD  cyclobenzaprine (FLEXERIL) 10 MG tablet Take 1 tablet (10 mg total) by mouth 2 (two) times daily as needed for muscle spasms. 12/09/21   Rising, Wells Guiles, PA-C  diclofenac Sodium (VOLTAREN ARTHRITIS PAIN) 1 % GEL Apply 2 g topically 4 (four) times daily. 12/09/21   Rising, Wells Guiles, PA-C  esomeprazole (NEXIUM) 20 MG capsule Take 20 mg by  mouth daily at 12 noon.    [provider]  hydrochlorothiazide (HYDRODIURIL) 25 MG tablet Take 1 tablet (25 mg total) by mouth daily. 02/11/22   Barrett Henle, MD  losartan (COZAAR) 50 MG tablet Take 1 tablet (50 mg total) by mouth daily. 02/11/22   Barrett Henle, MD  meclizine (ANTIVERT) 25 MG tablet Take 1 tablet (25 mg total) by mouth 3 (three) times daily as needed for dizziness. 07/29/21   Vanessa Kick, MD  insulin glargine (LANTUS) 100 UNIT/ML injection Inject 16 Units into the skin daily.  04/14/19  [provider]  metFORMIN (GLUCOPHAGE) 500 MG tablet Take 500 mg by mouth.  04/14/19  [provider]  pantoprazole (PROTONIX) 40 MG tablet Take 40 mg by mouth daily.  04/14/19  [provider]  pravastatin (PRAVACHOL) 40 MG tablet Take 40 mg by mouth daily.  04/14/19  [provider]    Family History Family History  Problem Relation Age of Onset   Cancer Mother    Cancer Father    Hypertension Sister    Heart murmur Sister    CVA Brother    CVA Brother    Hypertension Brother     Social History Social History   Tobacco Use   Smoking status:  Never   Smokeless tobacco: Never  Substance Use Topics   Alcohol use: Yes    Comment: occ, on weekends   Drug use: No     Allergies   Patient has no known allergies.   Review of Systems Review of Systems   Physical Exam Triage Vital Signs ED Triage Vitals [02/11/22 1834]  Enc Vitals Group     BP (!) 204/103     Pulse Rate 86     Resp 18     Temp 98.2 F (36.8 C)     Temp Source Oral     SpO2 99 %     Weight      Height      Head Circumference      Peak Flow      Pain Score      Pain Loc      Pain Edu?      Excl. in [place]?    No data found.  Updated Vital Signs BP (!) 204/103 (BP Location: Left Arm)   Pulse 86   Temp 98.2 F (36.8 C) (Oral)   Resp 18   SpO2 99%   Visual Acuity Right Eye Distance:   Left Eye Distance:   Bilateral Distance:    Right  Eye Near:   Left Eye Near:    Bilateral Near:     Physical Exam Vitals reviewed.  Constitutional:      General: He is not in acute distress.    Appearance: He is not ill-appearing, toxic-appearing or diaphoretic.  HENT:     Nose: Nose normal.     Mouth/Throat:     Mouth: Mucous membranes are moist.  Eyes:     Extraocular Movements: Extraocular movements intact.     Conjunctiva/sclera: Conjunctivae normal.     Pupils: Pupils are equal, round, and reactive to light.  Cardiovascular:     Rate and Rhythm: Normal rate and regular rhythm.     Heart sounds: No murmur heard. Pulmonary:     Effort: Pulmonary effort is normal.     Breath sounds: Normal breath sounds. No stridor. No wheezing, rhonchi or rales.  Musculoskeletal:     Cervical back: Neck supple.  Lymphadenopathy:     Cervical: No cervical adenopathy.  Skin:    Coloration: Skin is not jaundiced or pale.  Neurological:     Mental Status: He is alert and oriented to person, place, and time.  Psychiatric:        Behavior: Behavior normal.      UC Treatments / Results  Labs (all labs ordered are listed, but only abnormal results are displayed) Labs Reviewed  BASIC METABOLIC PANEL    EKG   Radiology No results found.  Procedures Procedures (including critical care time)  Medications Ordered in UC Medications - No data to display  Initial Impression / Assessment and Plan / UC Course  I have reviewed the triage vital signs and the nursing notes.  Pertinent labs & imaging results that were available during my care of the patient were reviewed by me and considered in my medical decision making (see chart for details).        The patient was already walking with a limp so difficult to tell how much more impaired he might be.  I discussed with him that in the future if he does find that he is having symptoms that might be consistent with stroke to proceed to the emergency room as quickly as possible.  Since we  are already 3 weeks into the symptom, I am not going to ask him to go to the emergency room today.  Staff will draw blood work, and we will send in 1 month supplies of his blood pressure medications and of metformin.  Staff will assist him in setting up a PCP appointment Final Clinical Impressions(s) / UC Diagnoses   Final diagnoses:  Essential hypertension, benign     Discharge Instructions      I have sent in prescriptions for your amlodipine, hydrochlorothiazide, and losartan.   It looks like we have not ever done your metformin prescription at this facility, so need to wait to see what your sugar is on your blood work before we send that in  We have drawn blood work to check your sugar, sodium and potassium, and kidney function.  Staff will notify you if there is anything that needs treatment  You can use the QR code/website at the back of the summary paperwork to schedule yourself a new patient appointment with primary care      ED Prescriptions     Medication Sig Dispense Auth. Provider   amLODipine (NORVASC) 5 MG tablet Take 1 tablet (5 mg total) by mouth daily. 30 tablet Barrett Henle, MD   hydrochlorothiazide (HYDRODIURIL) 25 MG tablet Take 1 tablet (25 mg total) by mouth daily. 30 tablet Barrett Henle, MD   losartan (COZAAR) 50 MG tablet Take 1 tablet (50 mg total) by mouth daily. 30 tablet Jumar Greenstreet, Gwenlyn Perking, MD      PDMP not reviewed this encounter.   Barrett Henle, MD 02/11/22 Mauro Kaufmann    Barrett Henle, MD 02/19/22 [phone]

## 2022-02-11 NOTE — ED Triage Notes (Signed)
Pt is requesting a refill for his blood pressure and diabetes medication. Pt last dose was 3 weeks ago. Pt reports is gait is off.

## 2022-02-11 NOTE — Discharge Instructions (Addendum)
I have sent in prescriptions for your amlodipine, hydrochlorothiazide, and losartan.   It looks like we have not ever done your metformin prescription at this facility, so need to wait to see what your sugar is on your blood work before we send that in  We have drawn blood work to check your sugar, sodium and potassium, and kidney function.  Staff will notify you if there is anything that needs treatment  You can use the QR code/website at the back of the summary paperwork to schedule yourself a new patient appointment with primary care

## 2022-02-18 ENCOUNTER — Ambulatory Visit: Payer: Self-pay | Admitting: Family Medicine

## 2022-02-23 ENCOUNTER — Ambulatory Visit: Payer: Medicaid Other | Admitting: Family Medicine

## 2022-03-03 ENCOUNTER — Other Ambulatory Visit (HOSPITAL_COMMUNITY): Payer: Self-pay

## 2022-03-29 ENCOUNTER — Other Ambulatory Visit (HOSPITAL_COMMUNITY): Payer: Self-pay

## 2022-04-02 ENCOUNTER — Ambulatory Visit (HOSPITAL_COMMUNITY)
Admission: EM | Admit: 2022-04-02 | Discharge: 2022-04-02 | Disposition: A | Discharge status: Home / Self Care | Payer: Commercial Managed Care - HMO | Attending: Nurse Practitioner | Admitting: Nurse Practitioner

## 2022-04-02 ENCOUNTER — Encounter (HOSPITAL_COMMUNITY): Payer: Self-pay | Admitting: *Deleted

## 2022-04-02 DIAGNOSIS — I10 Essential (primary) hypertension: Secondary | ICD-10-CM

## 2022-04-02 DIAGNOSIS — M5441 Lumbago with sciatica, right side: Secondary | ICD-10-CM | POA: Diagnosis not present

## 2022-04-02 DIAGNOSIS — G8929 Other chronic pain: Secondary | ICD-10-CM | POA: Diagnosis not present

## 2022-04-02 MED ORDER — HYDROCHLOROTHIAZIDE 25 MG PO TABS: 25.0000 mg | ORAL_TABLET | Freq: Every day | ORAL | 0 refills | Status: DC

## 2022-04-02 MED ORDER — AMLODIPINE BESYLATE 5 MG PO TABS: 5.0000 mg | ORAL_TABLET | Freq: Every day | ORAL | 0 refills | Status: DC

## 2022-04-02 MED ORDER — CYCLOBENZAPRINE HCL 10 MG PO TABS: 10.0000 mg | ORAL_TABLET | Freq: Two times a day (BID) | ORAL | 0 refills | Status: AC | PRN

## 2022-04-02 MED ORDER — LOSARTAN POTASSIUM 50 MG PO TABS: 50.0000 mg | ORAL_TABLET | Freq: Every day | ORAL | 0 refills | Status: DC

## 2022-04-02 NOTE — Discharge Instructions (Addendum)
Flexeril as needed.  Please note this medication can make you drowsy.  Do not drink alcohol or drive while on this medication Heat to the low back as needed Refilled your blood pressure medications.  Please establish with PCP for any additional refills Follow-up with your orthopedic doctor as scheduled on 12/22 Please go to the emergency room for any worsening symptoms
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
## 2022-04-02 NOTE — ED Triage Notes (Signed)
Pt states has pain starting in his upper legs he says the ain and muscle tension changes from right to left leg x 1 month. He has been using tylenol, IBU.  He also complains that he has decreased movement in his right hand X 1 month.   He states he has a appt with some doctor for this on 04/16/2022 he is unsure what kind of doctor he will be seeing.   Pt states he hasn't taken any BP meds, his last refill was 02/11/2022.

## 2022-05-03 ENCOUNTER — Other Ambulatory Visit (HOSPITAL_COMMUNITY): Payer: Self-pay

## 2022-05-08 DIAGNOSIS — M129 Arthropathy, unspecified: Secondary | ICD-10-CM | POA: Diagnosis not present

## 2022-05-08 DIAGNOSIS — M545 Low back pain, unspecified: Secondary | ICD-10-CM | POA: Diagnosis not present

## 2022-07-26 ENCOUNTER — Other Ambulatory Visit: Payer: Self-pay

## 2022-08-12 ENCOUNTER — Encounter: Payer: Self-pay | Admitting: *Deleted

## 2022-09-06 ENCOUNTER — Encounter (HOSPITAL_COMMUNITY): Payer: Self-pay

## 2022-09-06 ENCOUNTER — Ambulatory Visit (HOSPITAL_COMMUNITY)
Admission: EM | Admit: 2022-09-06 | Discharge: 2022-09-06 | Disposition: A | Discharge status: Home / Self Care | Payer: Commercial Managed Care - HMO | Attending: Physician Assistant | Admitting: Physician Assistant

## 2022-09-06 DIAGNOSIS — M5431 Sciatica, right side: Secondary | ICD-10-CM

## 2022-09-06 DIAGNOSIS — I10 Essential (primary) hypertension: Secondary | ICD-10-CM

## 2022-09-06 DIAGNOSIS — M5136 Other intervertebral disc degeneration, lumbar region: Secondary | ICD-10-CM

## 2022-09-06 DIAGNOSIS — M5432 Sciatica, left side: Secondary | ICD-10-CM

## 2022-09-06 DIAGNOSIS — M792 Neuralgia and neuritis, unspecified: Secondary | ICD-10-CM

## 2022-09-06 MED ORDER — GABAPENTIN 300 MG PO CAPS: 300.0000 mg | ORAL_CAPSULE | Freq: Every day | ORAL | 0 refills | Status: DC

## 2022-09-06 MED ORDER — LIDOCAINE 5 % EX PTCH
1.0000 | MEDICATED_PATCH | CUTANEOUS | 0 refills | Status: DC
Start: 2022-09-06 — End: 2023-03-30

## 2022-09-06 MED ORDER — AMLODIPINE BESYLATE 10 MG PO TABS: 10.0000 mg | ORAL_TABLET | Freq: Every day | ORAL | 0 refills | Status: DC

## 2022-09-06 MED ORDER — METHOCARBAMOL 500 MG PO TABS: 500.0000 mg | ORAL_TABLET | Freq: Two times a day (BID) | ORAL | 0 refills | Status: DC

## 2022-09-06 NOTE — Discharge Instructions (Signed)
Take your blood pressure medication as soon as you get home.  Monitor your blood pressure at home.  Avoid caffeine, sodium, decongestants, NSAIDs (aspirin, ibuprofen/Advil, naproxen/Aleve).  Follow-up with either our clinic or primary care within the week for recheck.  If you develop any chest pain, shortness of breath, headache, vision change, dizziness you need to go to the emergency room.  Follow-up with orthopedics for further evaluation and management of your back pain.  Take gabapentin at night.  Take methocarbamol up to twice a day.  These both will make you sleepy so do not drive or drink alcohol with taking it.  Use heat and gentle stretch for symptom relief.  Use lidocaine patches for specific areas that are bothersome.  If you have any worsening symptoms including bowel/bladder incontinence (going to the bathroom on yourself without noticing it), weakness in your legs, increasing numbness, need to be seen immediately.

## 2022-09-06 NOTE — ED Triage Notes (Signed)
Pt reports he is here for sciatica pain. He reports R-L leg pain for several days. No trauma or falls.

## 2022-09-06 NOTE — ED Provider Notes (Signed)
[facility]    CSN: [id number] Arrival date & time: 09/06/22  1606      History   Chief Complaint Chief Complaint  Patient presents with   Leg Pain   Back Pain    HPI CURBY BADY is a 52 y.o. male.   Patient presents today with a prolonged history (many months) of lower back pain with bilateral sciatica.  He reports that he was an accident several years ago and had an MRI that had with a different organization on 03/06/2020 that showed significant degenerative changes including spinal stenosis at L4/L5 and degeneration at L5/S1 with disc herniation and mass effect on the left S1 nerve root with associated neuroforaminal stenosis.  He has not had any additional imaging.  Previously was prescribed hydrocodone and oxycodone which provided improvement of symptoms.  He has not had these in several months.  He reports that pain is rated 10 on a 0-10 pain scale, described as intense aching with occasional numbness and paresthesias.  He denies any bowel/bladder incontinence, lower extremity weakness, saddle anesthesia.  He has been taking over-the-counter medications without improvement of symptoms.  Denies any previous spinal surgery.  Denies history of malignancy.  Denies any recent trauma or change in activity prior to symptom onset.  Patient is blood pressure is very elevated today.  Reports that he has not been consistently taking his antihypertensive medication.  He does not currently have a PCP.  Patient does occasionally use NSAIDs.  Denies any significant sodium, decongestants, caffeine consumption.  He denies any current chest pain, shortness of breath, headache, vision change, dizziness.  He does have a blood pressure cuff at home and can monitor his blood pressure.  He reports having plenty of blood pressure medication available.     Past Medical History:  Diagnosis Date   Asthma    Diabetes mellitus without complication (HCC)    Eczema    GERD (gastroesophageal  reflux disease)    Hypertension     Patient Active Problem List   Diagnosis Date Noted   Spinal stenosis of lumbar region 04/22/2020   Acute pain of left knee 09/17/2019   Protrusion of lumbar intervertebral disc 05/29/2019   SOB (shortness of breath) 05/10/2013   Essential hypertension, benign 04/24/2013   Chest pain, unspecified 04/24/2013    Past Surgical History:  Procedure Laterality Date   HERNIA REPAIR         Home Medications    Prior to Admission medications   Medication Sig Start Date End Date Taking? Authorizing Provider  amLODipine (NORVASC) 10 MG tablet Take 1 tablet (10 mg total) by mouth daily. 09/06/22  Yes Hokulani Rogel K, PA-C  diclofenac Sodium (VOLTAREN ARTHRITIS PAIN) 1 % GEL Apply 2 g topically 4 (four) times daily. 12/09/21  Yes Rising, Lurena Joiner, PA-C  gabapentin (NEURONTIN) 300 MG capsule Take 1 capsule (300 mg total) by mouth at bedtime. 09/06/22  Yes Maridee Slape K, PA-C  lidocaine (LIDODERM) 5 % Place 1 patch onto the skin daily. Remove & Discard patch within 12 hours or as directed by MD 09/06/22  Yes Verlee Pope, Noberto Retort, PA-C  methocarbamol (ROBAXIN) 500 MG tablet Take 1 tablet (500 mg total) by mouth 2 (two) times daily. 09/06/22  Yes Cristian Davitt K, PA-C  esomeprazole (NEXIUM) 20 MG capsule Take 20 mg by mouth daily at 12 noon.    [provider]  hydrochlorothiazide (HYDRODIURIL) 25 MG tablet Take 1 tablet (25 mg total) by mouth daily. 04/02/22 05/02/22  Radford Pax, NP  losartan (COZAAR) 50 MG tablet Take 1 tablet (50 mg total) by mouth daily. 04/02/22 05/02/22  Radford Pax, NP  meclizine (ANTIVERT) 25 MG tablet Take 1 tablet (25 mg total) by mouth 3 (three) times daily as needed for dizziness. 07/29/21   Mardella Layman, MD  insulin glargine (LANTUS) 100 UNIT/ML injection Inject 16 Units into the skin daily.  04/14/19  [provider]  metFORMIN (GLUCOPHAGE) 500 MG tablet Take 500 mg by mouth.  04/14/19  [provider]  pantoprazole  (PROTONIX) 40 MG tablet Take 40 mg by mouth daily.  04/14/19  [provider]  pravastatin (PRAVACHOL) 40 MG tablet Take 40 mg by mouth daily.  04/14/19  [provider]    Family History Family History  Problem Relation Age of Onset   Cancer Mother    Cancer Father    Hypertension Sister    Heart murmur Sister    CVA Brother    CVA Brother    Hypertension Brother     Social History Social History   Tobacco Use   Smoking status: Never   Smokeless tobacco: Never  Substance Use Topics   Alcohol use: Yes    Comment: occ, on weekends   Drug use: No     Allergies   Patient has no known allergies.   Review of Systems Review of Systems  Constitutional:  Positive for activity change. Negative for appetite change, fatigue and fever.  Eyes:  Negative for photophobia and visual disturbance.  Respiratory:  Negative for shortness of breath.   Cardiovascular:  Negative for chest pain, palpitations and leg swelling.  Musculoskeletal:  Positive for back pain and myalgias. Negative for arthralgias.  Neurological:  Positive for numbness. Negative for dizziness, weakness, light-headedness and headaches.     Physical Exam Triage Vital Signs ED Triage Vitals  Enc Vitals Group     BP 09/06/22 1757 (!) 205/110     Pulse Rate 09/06/22 1757 85     Resp 09/06/22 1757 18     Temp 09/06/22 1756 98.1 F (36.7 C)     Temp Source 09/06/22 1756 Oral     SpO2 09/06/22 1757 98 %     Weight --      Height --      Head Circumference --      Peak Flow --      Pain Score --      Pain Loc --      Pain Edu? --      Excl. in GC? --    No data found.  Updated Vital Signs BP (!) 205/110 (BP Location: Left Arm) Comment: Does not remember what medication he is taking for his bp.  Pulse 73   Temp 98 F (36.7 C) (Oral)   Resp 18   SpO2 97%   Visual Acuity Right Eye Distance:   Left Eye Distance:   Bilateral Distance:    Right Eye Near:   Left Eye Near:    Bilateral  Near:     Physical Exam Vitals reviewed.  Constitutional:      General: He is awake.     Appearance: Normal appearance. He is well-developed. He is not ill-appearing.     Comments: Very pleasant male appears stated age in no acute distress sitting comfortably in exam room  HENT:     Head: Normocephalic and atraumatic.     Mouth/Throat:     Pharynx: No oropharyngeal exudate, posterior oropharyngeal erythema  or uvula swelling.  Cardiovascular:     Rate and Rhythm: Normal rate and regular rhythm.     Heart sounds: Normal heart sounds, S1 normal and S2 normal. No murmur heard. Pulmonary:     Effort: Pulmonary effort is normal.     Breath sounds: Normal breath sounds. No stridor. No wheezing, rhonchi or rales.     Comments: Clear to auscultation bilaterally Musculoskeletal:     Cervical back: No tenderness or bony tenderness.     Thoracic back: No tenderness or bony tenderness.     Lumbar back: Tenderness present. No bony tenderness. Negative right straight leg raise test and negative left straight leg raise test.     Right lower leg: No edema.     Left lower leg: No edema.     Comments: Back: Tender to palpation of the lumbar paraspinal muscles bilaterally.  Pain with percussion of vertebrae.  No deformity or step-off noted.  Strength 5/5 bilateral lower extremities.  Neurological:     Mental Status: He is alert.  Psychiatric:        Behavior: Behavior is cooperative.      UC Treatments / Results  Labs (all labs ordered are listed, but only abnormal results are displayed) Labs Reviewed - No data to display  EKG   Radiology No results found.  Procedures Procedures (including critical care time)  Medications Ordered in UC Medications - No data to display  Initial Impression / Assessment and Plan / UC Course  I have reviewed the triage vital signs and the nursing notes.  Pertinent labs & imaging results that were available during my care of the patient were reviewed by  me and considered in my medical decision making (see chart for details).     Patient is well-appearing, afebrile, nontoxic, nontachycardic.  Plain films were deferred as patient denies any recent trauma and has no focal bony tenderness.  We discussed that his ongoing chronic pain is likely related to significant degenerative disc disease.  Recommended that he follow-up with orthopedics for further evaluation management as he likely needs updated imaging and consideration of additional interventions.  We discussed that we are unable to prescribe chronic pain medication so cannot give him a prescription for hydrocodone/oxycodone.  He was started on gabapentin at night to help with neuropathy symptoms as well as methocarbamol twice daily.  Discussed that these can be sedating and he is not to drive or drink alcohol with taking it.  We will defer NSAIDs given his elevated blood pressure for the time being.  He can use lidocaine patches for specific painful areas.  He does not currently have a primary care so we will try to establish him with someone via PCP assistance.  He was also given the contact information for a orthopedic provider with instruction to call to schedule appointment as needed.  Discussed that if he has any worsening or changing symptoms including bowel/bladder incontinence, lower extremity weakness, saddle anesthesia he is to be seen immediately.  Blood pressure is very elevated today.  Recheck was seen.  Patient denies any signs/symptoms of endorgan damage.  Requested urine and blood testing to monitor his kidney function given the elevated blood pressure but he declined this today as he feels fine.  We discussed the importance of compliance with antihypertensive medication regimen.  Refill of amlodipine 10 mg present to pharmacy.  Discussed that he is to avoid NSAIDs, caffeine, sodium, decongestants.  He does not currently have a PCP so will try to  establish him with one of the PCP assistance.   If he is unable to see them within a week he is to return here for blood pressure recheck and medication adjustment.  Strict return precautions given to which he expressed understanding.  Final Clinical Impressions(s) / UC Diagnoses   Final diagnoses:  DDD (degenerative disc disease), lumbar  Bilateral sciatica  Neuropathic pain  Elevated blood pressure reading with diagnosis of hypertension     Discharge Instructions      Take your blood pressure medication as soon as you get home.  Monitor your blood pressure at home.  Avoid caffeine, sodium, decongestants, NSAIDs (aspirin, ibuprofen/Advil, naproxen/Aleve).  Follow-up with either our clinic or primary care within the week for recheck.  If you develop any chest pain, shortness of breath, headache, vision change, dizziness you need to go to the emergency room.  Follow-up with orthopedics for further evaluation and management of your back pain.  Take gabapentin at night.  Take methocarbamol up to twice a day.  These both will make you sleepy so do not drive or drink alcohol with taking it.  Use heat and gentle stretch for symptom relief.  Use lidocaine patches for specific areas that are bothersome.  If you have any worsening symptoms including bowel/bladder incontinence (going to the bathroom on yourself without noticing it), weakness in your legs, increasing numbness, need to be seen immediately.     ED Prescriptions     Medication Sig Dispense Auth. Provider   amLODipine (NORVASC) 10 MG tablet Take 1 tablet (10 mg total) by mouth daily. 30 tablet Kyliah Deanda K, PA-C   gabapentin (NEURONTIN) 300 MG capsule Take 1 capsule (300 mg total) by mouth at bedtime. 30 capsule Diogo Anne K, PA-C   methocarbamol (ROBAXIN) 500 MG tablet Take 1 tablet (500 mg total) by mouth 2 (two) times daily. 20 tablet Amalya Salmons K, PA-C   lidocaine (LIDODERM) 5 % Place 1 patch onto the skin daily. Remove & Discard patch within 12 hours or as directed by MD  30 patch Haylea Schlichting K, PA-C      PDMP not reviewed this encounter.   Jeani Hawking, PA-C 09/06/22 1839

## 2022-10-25 ENCOUNTER — Ambulatory Visit (HOSPITAL_COMMUNITY)
Admission: EM | Admit: 2022-10-25 | Discharge: 2022-10-25 | Disposition: A | Discharge status: Home / Self Care | Payer: 59 | Attending: Internal Medicine | Admitting: Internal Medicine

## 2022-10-25 ENCOUNTER — Encounter (HOSPITAL_COMMUNITY): Payer: Self-pay | Admitting: *Deleted

## 2022-10-25 DIAGNOSIS — I160 Hypertensive urgency: Secondary | ICD-10-CM

## 2022-10-25 DIAGNOSIS — I10 Essential (primary) hypertension: Secondary | ICD-10-CM

## 2022-10-25 DIAGNOSIS — B356 Tinea cruris: Secondary | ICD-10-CM

## 2022-10-25 HISTORY — DX: Sciatica, unspecified side: M54.30

## 2022-10-25 MED ORDER — CLOTRIMAZOLE-BETAMETHASONE 1-0.05 % EX CREA: TOPICAL_CREAM | CUTANEOUS | 0 refills | Status: DC

## 2022-10-25 MED ORDER — LOSARTAN POTASSIUM 50 MG PO TABS: 50.0000 mg | ORAL_TABLET | Freq: Every day | ORAL | 1 refills | Status: DC

## 2022-10-25 MED ORDER — MICONAZOLE POWD: 0 refills | Status: DC

## 2022-10-25 MED ORDER — AMLODIPINE BESYLATE 10 MG PO TABS: 10.0000 mg | ORAL_TABLET | Freq: Every day | ORAL | 1 refills | Status: DC

## 2022-10-25 NOTE — ED Provider Notes (Incomplete)
[facility]    CSN: [id number] Arrival date & time: 10/25/22  1319      History   Chief Complaint No chief complaint on file.   HPI Peter Bell is a 52 y.o. male.   HPI  Past Medical History:  Diagnosis Date  . Asthma   . Diabetes mellitus without complication (HCC)   . Eczema   . GERD (gastroesophageal reflux disease)   . Hypertension     Patient Active Problem List   Diagnosis Date Noted  . Spinal stenosis of lumbar region 04/22/2020  . Acute pain of left knee 09/17/2019  . Protrusion of lumbar intervertebral disc 05/29/2019  . SOB (shortness of breath) 05/10/2013  . Essential hypertension, benign 04/24/2013  . Chest pain, unspecified 04/24/2013    Past Surgical History:  Procedure Laterality Date  . HERNIA REPAIR         Home Medications    Prior to Admission medications   Medication Sig Start Date End Date Taking? Authorizing Provider  amLODipine (NORVASC) 10 MG tablet Take 1 tablet (10 mg total) by mouth daily. 09/06/22   Raspet, Noberto Retort, PA-C  diclofenac Sodium (VOLTAREN ARTHRITIS PAIN) 1 % GEL Apply 2 g topically 4 (four) times daily. 12/09/21   Rising, Lurena Joiner, PA-C  esomeprazole (NEXIUM) 20 MG capsule Take 20 mg by mouth daily at 12 noon.    [provider]  gabapentin (NEURONTIN) 300 MG capsule Take 1 capsule (300 mg total) by mouth at bedtime. 09/06/22   Raspet, Noberto Retort, PA-C  hydrochlorothiazide (HYDRODIURIL) 25 MG tablet Take 1 tablet (25 mg total) by mouth daily. 04/02/22 05/02/22  Radford Pax, NP  lidocaine (LIDODERM) 5 % Place 1 patch onto the skin daily. Remove & Discard patch within 12 hours or as directed by MD 09/06/22   Raspet, Denny Peon K, PA-C  losartan (COZAAR) 50 MG tablet Take 1 tablet (50 mg total) by mouth daily. 04/02/22 05/02/22  Radford Pax, NP  meclizine (ANTIVERT) 25 MG tablet Take 1 tablet (25 mg total) by mouth 3 (three) times daily as needed for dizziness. 07/29/21   Mardella Layman, MD  methocarbamol (ROBAXIN) 500  MG tablet Take 1 tablet (500 mg total) by mouth 2 (two) times daily. 09/06/22   Raspet, Erin K, PA-C  insulin glargine (LANTUS) 100 UNIT/ML injection Inject 16 Units into the skin daily.  04/14/19  [provider]  metFORMIN (GLUCOPHAGE) 500 MG tablet Take 500 mg by mouth.  04/14/19  [provider]  pantoprazole (PROTONIX) 40 MG tablet Take 40 mg by mouth daily.  04/14/19  [provider]  pravastatin (PRAVACHOL) 40 MG tablet Take 40 mg by mouth daily.  04/14/19  [provider]    Family History Family History  Problem Relation Age of Onset  . Cancer Mother   . Cancer Father   . Hypertension Sister   . Heart murmur Sister   . CVA Brother   . CVA Brother   . Hypertension Brother     Social History Social History   Tobacco Use  . Smoking status: Never  . Smokeless tobacco: Never  Substance Use Topics  . Alcohol use: Yes    Comment: occ, on weekends  . Drug use: No     Allergies   Patient has no known allergies.   Review of Systems Review of Systems   Physical Exam Triage Vital Signs ED Triage Vitals  Enc Vitals Group     BP  Pulse      Resp      Temp      Temp src      SpO2      Weight      Height      Head Circumference      Peak Flow      Pain Score      Pain Loc      Pain Edu?      Excl. in GC?    No data found.  Updated Vital Signs There were no vitals taken for this visit.  Visual Acuity Right Eye Distance:   Left Eye Distance:   Bilateral Distance:    Right Eye Near:   Left Eye Near:    Bilateral Near:     Physical Exam   UC Treatments / Results  Labs (all labs ordered are listed, but only abnormal results are displayed) Labs Reviewed - No data to display  EKG   Radiology No results found.  Procedures Procedures (including critical care time)  Medications Ordered in UC Medications - No data to display  Initial Impression / Assessment and Plan / UC Course  I have reviewed the  triage vital signs and the nursing notes.  Pertinent labs & imaging results that were available during my care of the patient were reviewed by me and considered in my medical decision making (see chart for details).     *** Final Clinical Impressions(s) / UC Diagnoses   Final diagnoses:  None   Discharge Instructions   None    ED Prescriptions   None    PDMP not reviewed this encounter.

## 2022-10-25 NOTE — ED Triage Notes (Signed)
Pt reports pruritic bilat groin rash onset approx 2 wks ago. 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 Has been applying hydrocortisone cream without relief.  Pt reports running out of HTN med approx 1 wk ago -- states does not have PCP.

## 2022-10-25 NOTE — Discharge Instructions (Addendum)
Please antifungal powder to the groin area in the mornings and apply the antifungal cream at bedtime.  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 Please pick up your blood pressure medications today and start taking them today. If you have headache, chest pain, abdominal pain, difficulty with your speech, numbness/tingling or extremity weakness please go to the emergency room immediately. If you have worsening rash please return to urgent care to be reevaluated.

## 2022-11-29 ENCOUNTER — Ambulatory Visit: Payer: Self-pay | Admitting: Internal Medicine

## 2023-01-08 ENCOUNTER — Encounter (HOSPITAL_COMMUNITY): Payer: Self-pay

## 2023-01-08 ENCOUNTER — Ambulatory Visit (HOSPITAL_COMMUNITY)
Admission: EM | Admit: 2023-01-08 | Discharge: 2023-01-08 | Disposition: A | Discharge status: Home / Self Care | Payer: Commercial Managed Care - HMO | Attending: Emergency Medicine | Admitting: Emergency Medicine

## 2023-01-08 DIAGNOSIS — I10 Essential (primary) hypertension: Secondary | ICD-10-CM

## 2023-01-08 DIAGNOSIS — B356 Tinea cruris: Secondary | ICD-10-CM

## 2023-01-08 MED ORDER — MICONAZOLE NITRATE 2 % EX POWD: CUTANEOUS | 0 refills | Status: DC

## 2023-01-08 MED ORDER — CLOTRIMAZOLE-BETAMETHASONE 1-0.05 % EX CREA: TOPICAL_CREAM | CUTANEOUS | 0 refills | Status: DC

## 2023-01-08 NOTE — ED Triage Notes (Signed)
Patient here today with c/o itchy rash on inner thighs X 2 months. Patient has tried using Lamisil with little relief and has been using baby powder to alleviate the itching. He was here before for the same issue and would like to get another prescription of what he had before.

## 2023-01-08 NOTE — Discharge Instructions (Signed)
Please use the Lotrisone cream twice daily as needed to help with itching and the rash.  Also you can apply the miconazole powder to your groin folds to help keep them dry to prevent the growth of tinea.  Our staff will help schedule you with a primary care provider, it is important to follow-up with your chronic hypertension management with them to prevent complications.  Return to clinic for any new or urgent symptoms.

## 2023-01-08 NOTE — ED Provider Notes (Signed)
[facility]    CSN: [id number] Arrival date & time: 01/08/23  1154      History   Chief Complaint Chief Complaint  Patient presents with   Rash    HPI Peter Bell is a 52 y.o. male.   Patient presents to clinic for complaints of an itchy rash into his groin folds that has been present for the past few weeks.  He was seen for tinea cruris previously and reports the cream helped, but it was a small amount and he ran out quickly. He sweats a lot so he shaves his legs.   Denies rash spreading.   Reports his blood pressure is high but thinks it needs to be taken at a different time. Does not have a current PCP.   The history is provided by the patient and medical records.  Rash Associated symptoms: no fever     Past Medical History:  Diagnosis Date   Asthma    Diabetes mellitus without complication (HCC)    Eczema    GERD (gastroesophageal reflux disease)    Hypertension    Sciatica     Patient Active Problem List   Diagnosis Date Noted   Spinal stenosis of lumbar region 04/22/2020   Acute pain of left knee 09/17/2019   Protrusion of lumbar intervertebral disc 05/29/2019   SOB (shortness of breath) 05/10/2013   Essential hypertension, benign 04/24/2013   Chest pain, unspecified 04/24/2013    Past Surgical History:  Procedure Laterality Date   HERNIA REPAIR         Home Medications    Prior to Admission medications   Medication Sig Start Date End Date Taking? Authorizing Provider  amLODipine (NORVASC) 10 MG tablet Take 1 tablet (10 mg total) by mouth daily. 10/25/22  Yes Lamptey, Britta Mccreedy, MD  miconazole (MICOTIN) 2 % powder Apply topically twice daily to groin folds. 01/08/23  Yes Rinaldo Ratel, [place] N, FNP  clotrimazole-betamethasone (LOTRISONE) cream Apply to affected area 2 times daily prn until the rash subsides. 01/08/23   Anaiza Behrens, [place] N, FNP  diclofenac Sodium (VOLTAREN ARTHRITIS PAIN) 1 % GEL Apply 2 g topically 4 (four) times daily.  12/09/21   Rising, Lurena Joiner, PA-C  esomeprazole (NEXIUM) 20 MG capsule Take 20 mg by mouth daily at 12 noon.    [provider]  gabapentin (NEURONTIN) 300 MG capsule Take 1 capsule (300 mg total) by mouth at bedtime. 09/06/22   Raspet, Noberto Retort, PA-C  hydrochlorothiazide (HYDRODIURIL) 25 MG tablet Take 1 tablet (25 mg total) by mouth daily. 04/02/22 05/02/22  Radford Pax, NP  lidocaine (LIDODERM) 5 % Place 1 patch onto the skin daily. Remove & Discard patch within 12 hours or as directed by MD 09/06/22   Raspet, Denny Peon K, PA-C  losartan (COZAAR) 50 MG tablet Take 1 tablet (50 mg total) by mouth daily. 10/25/22 11/24/22  Merrilee Jansky, MD  meclizine (ANTIVERT) 25 MG tablet Take 1 tablet (25 mg total) by mouth 3 (three) times daily as needed for dizziness. 07/29/21   Mardella Layman, MD  methocarbamol (ROBAXIN) 500 MG tablet Take 1 tablet (500 mg total) by mouth 2 (two) times daily. 09/06/22   Raspet, Noberto Retort, PA-C  Miconazole POWD Apply powder to the groin area during the daytime until the rash subsides. 10/25/22   Lamptey, Britta Mccreedy, MD  insulin glargine (LANTUS) 100 UNIT/ML injection Inject 16 Units into the skin daily.  04/14/19  [provider]  metFORMIN (GLUCOPHAGE) 500 MG tablet  Take 500 mg by mouth.  04/14/19  [provider]  pantoprazole (PROTONIX) 40 MG tablet Take 40 mg by mouth daily.  04/14/19  [provider]  pravastatin (PRAVACHOL) 40 MG tablet Take 40 mg by mouth daily.  04/14/19  [provider]    Family History Family History  Problem Relation Age of Onset   Cancer Mother    Cancer Father    Hypertension Sister    Heart murmur Sister    CVA Brother    CVA Brother    Hypertension Brother     Social History Social History   Tobacco Use   Smoking status: Never   Smokeless tobacco: Never  Vaping Use   Vaping status: Never Used  Substance Use Topics   Alcohol use: Not Currently   Drug use: No     Allergies   Patient has no known  allergies.   Review of Systems Review of Systems  Constitutional:  Negative for fever.  Skin:  Positive for rash.     Physical Exam Triage Vital Signs ED Triage Vitals [01/08/23 1307]  Encounter Vitals Group     BP (!) 210/105     Systolic BP Percentile      Diastolic BP Percentile      Pulse Rate 73     Resp 16     Temp 98 F (36.7 C)     Temp Source Oral     SpO2 97 %     Weight 143 lb (64.9 kg)     Height 5\' 5"  (1.651 m)     Head Circumference      Peak Flow      Pain Score 0     Pain Loc      Pain Education      Exclude from Growth Chart    No data found.  Updated Vital Signs BP (!) 210/105 (BP Location: Right Arm)   Pulse 73   Temp 98 F (36.7 C) (Oral)   Resp 16   Ht 5\' 5"  (1.651 m)   Wt 143 lb (64.9 kg)   SpO2 97%   BMI 23.80 kg/m   Visual Acuity Right Eye Distance:   Left Eye Distance:   Bilateral Distance:    Right Eye Near:   Left Eye Near:    Bilateral Near:     Physical Exam Vitals and nursing note reviewed.  Constitutional:      Appearance: Normal appearance.  HENT:     Head: Normocephalic and atraumatic.     Right Ear: External ear normal.     Left Ear: External ear normal.     Nose: Nose normal.     Mouth/Throat:     Mouth: Mucous membranes are moist.  Eyes:     General: No scleral icterus. Cardiovascular:     Rate and Rhythm: Normal rate.  Pulmonary:     Effort: Pulmonary effort is normal. No respiratory distress.  Musculoskeletal:        General: Normal range of motion.  Skin:    General: Skin is warm and dry.     Findings: Rash present.     Comments: Erythematous and scaly plaque with raised borders in the groin, characteristic of tinea cruris.  Neurological:     General: No focal deficit present.     Mental Status: He is alert and oriented to person, place, and time.  Psychiatric:        Mood and Affect: Mood normal.  Behavior: Behavior normal. Behavior is cooperative.      UC Treatments / Results   Labs (all labs ordered are listed, but only abnormal results are displayed) Labs Reviewed - No data to display  EKG   Radiology No results found.  Procedures Procedures (including critical care time)  Medications Ordered in UC Medications - No data to display  Initial Impression / Assessment and Plan / UC Course  I have reviewed the triage vital signs and the nursing notes.  Pertinent labs & imaging results that were available during my care of the patient were reviewed by me and considered in my medical decision making (see chart for details).  Vitals and triage reviewed, patient is hemodynamically stable.  Patient hypertensive with history of same, no signs or concerns for hypertensive emergency.  Rash in groin folds consistent with tinea cruris, will provide miconazole powder and Lotrisone cream.  Staff to schedule with primary care provider for further management of hypertension.  Plan of care, follow-up care and return precautions given, no questions at this time.     Final Clinical Impressions(s) / UC Diagnoses   Final diagnoses:  Essential hypertension  Tinea cruris     Discharge Instructions      Please use the Lotrisone cream twice daily as needed to help with itching and the rash.  Also you can apply the miconazole powder to your groin folds to help keep them dry to prevent the growth of tinea.  Our staff will help schedule you with a primary care provider, it is important to follow-up with your chronic hypertension management with them to prevent complications.  Return to clinic for any new or urgent symptoms.     ED Prescriptions     Medication Sig Dispense Auth. Provider   clotrimazole-betamethasone (LOTRISONE) cream Apply to affected area 2 times daily prn until the rash subsides. 45 g Rinaldo Ratel, [place] N, [place]   miconazole (MICOTIN) 2 % powder Apply topically twice daily to groin folds. 70 g Lizzeth Meder, [place] N, [place]      PDMP not reviewed this  encounter.   Jonae Renshaw, [place] N, [place] 01/08/23 1345

## 2023-01-26 ENCOUNTER — Ambulatory Visit: Payer: Self-pay | Admitting: Family

## 2023-02-06 ENCOUNTER — Ambulatory Visit (HOSPITAL_COMMUNITY)
Admission: EM | Admit: 2023-02-06 | Discharge: 2023-02-06 | Discharge status: Against Medical Advice | Payer: Managed Care, Other (non HMO)

## 2023-02-06 NOTE — ED Notes (Signed)
Called patient from the lobby and cell phone on file with no response. Phone call went to voicemail with patient identified in the automatic message.

## 2023-02-06 NOTE — ED Notes (Addendum)
Attempt to call pt via provided phone number without success -- went to VM. No response from waiting room.

## 2023-02-14 ENCOUNTER — Encounter: Payer: Self-pay | Admitting: Family

## 2023-02-14 NOTE — Progress Notes (Signed)
  This encounter was created in error - please disregard. No show 

## 2023-02-25 IMAGING — DX DG HAND COMPLETE 3+V*L*
3 series · 3 of 3 positions shown · non-contrast
Comparison: None.

CLINICAL DATA: Left hand pain and swelling for 3 days

EXAM:
LEFT HAND - COMPLETE 3+ VIEW

[hand pa]
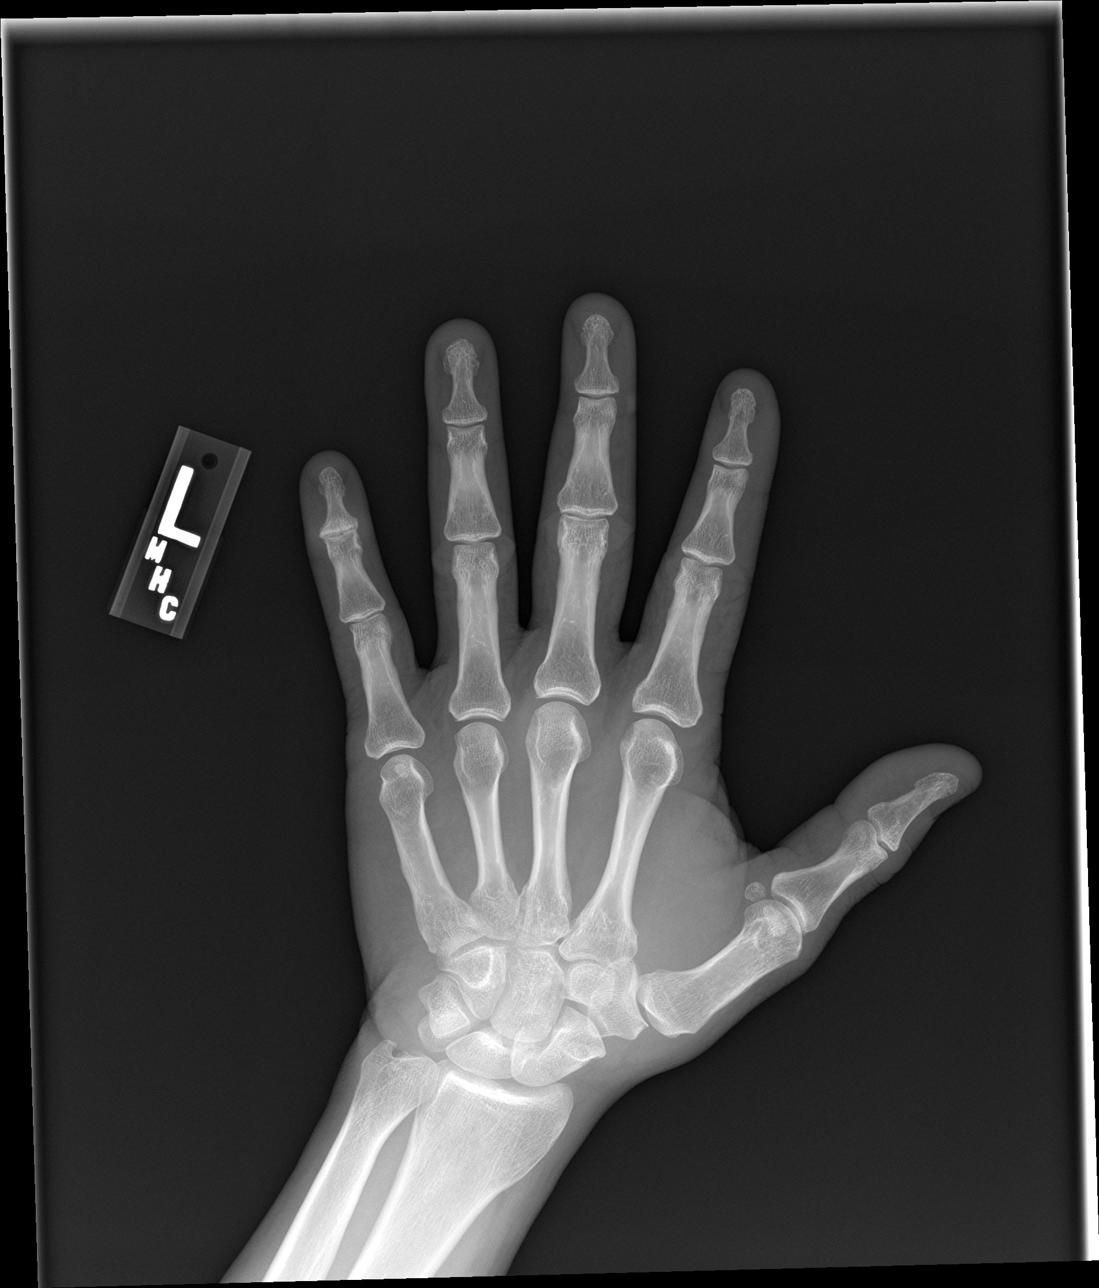

[hand obl]
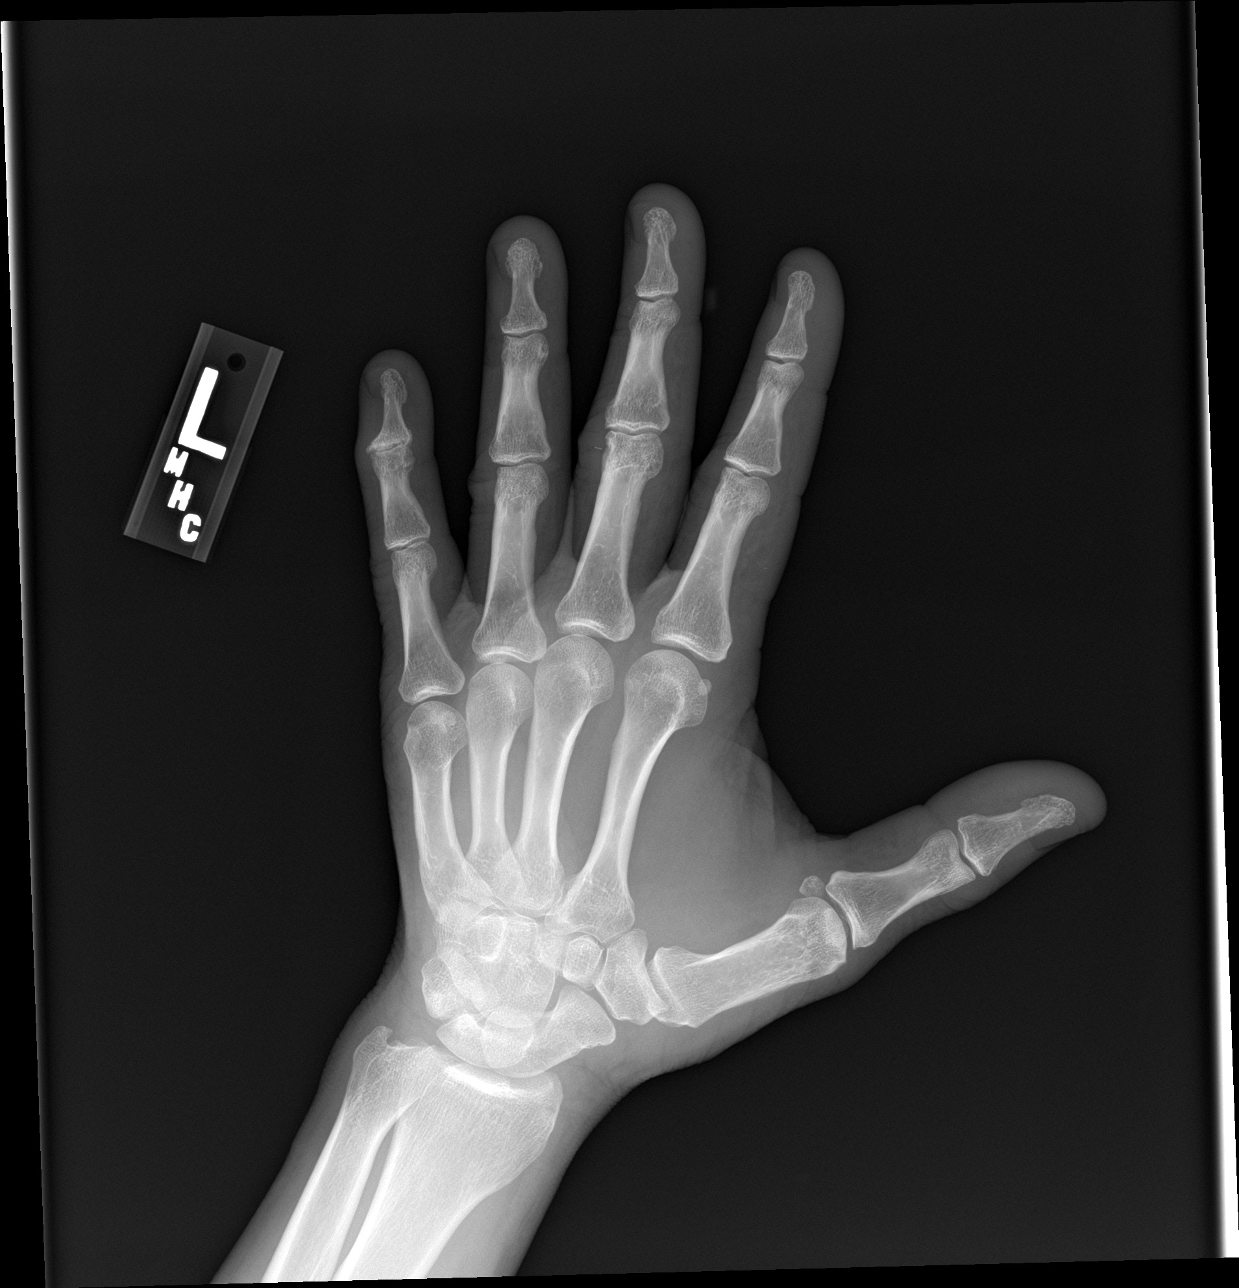

[hand lat]
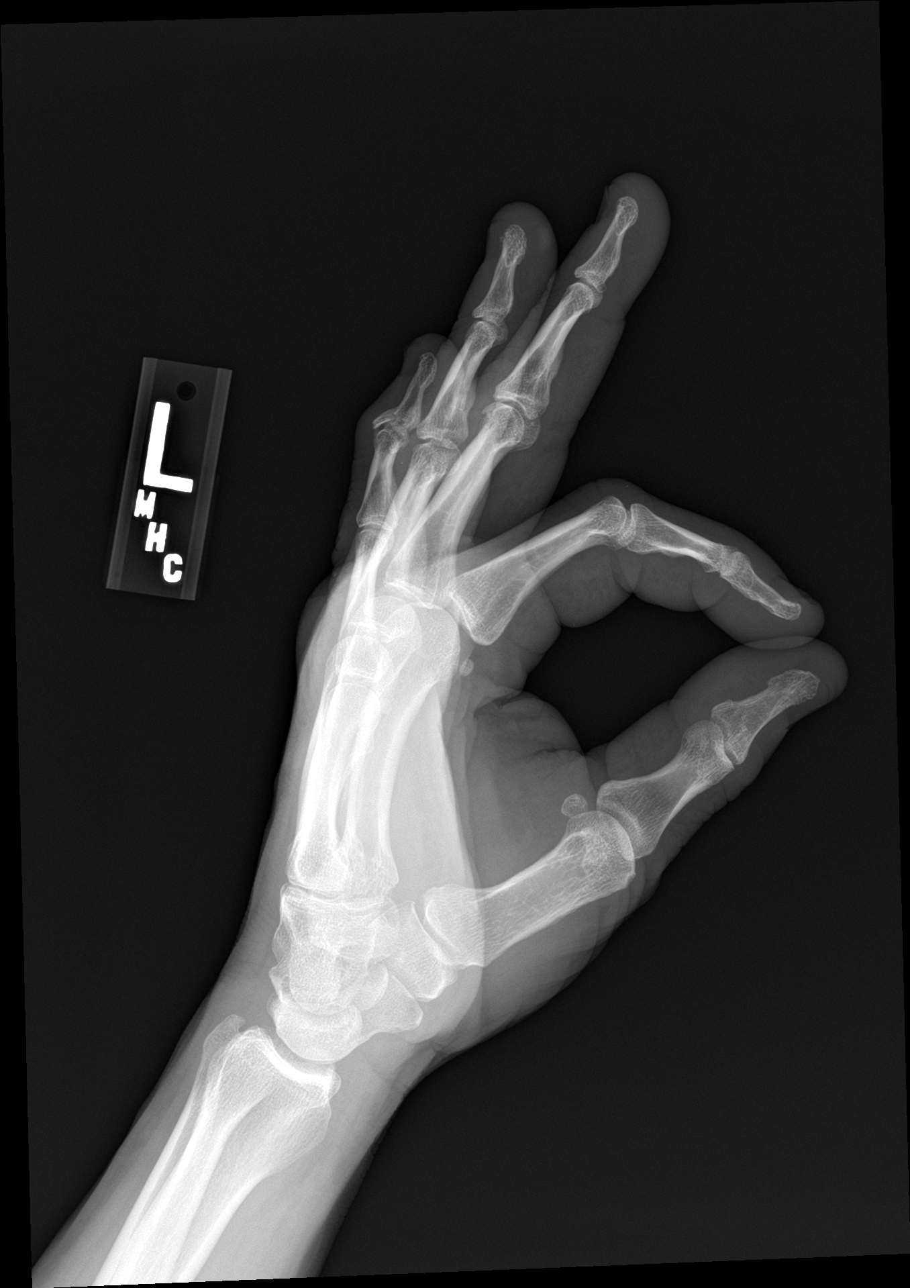

[3 of 3 positions shown; findings below may reference images not displayed]

FINDINGS: Frontal, oblique, and lateral views of the left hand are obtained.
No acute fracture, subluxation, or dislocation. Osteoarthritis
involving the distal interphalangeal joints, greatest at the fifth D
IP. There is a 2 mm linear metallic foreign body within the dorsal
soft tissues overlying the distal aspect of the third proximal
phalanx. This is of uncertain acuity. Soft tissues are otherwise
unremarkable.
IMPRESSION: 1. No acute displaced fracture.
2. Osteoarthritis, most pronounced within the distal interphalangeal
joints greatest at the fifth digit.
3. 2 mm linear metallic foreign body within the dorsal soft tissues
overlying the distal aspect third proximal phalanx. This is of
uncertain acuity.

## 2023-03-28 ENCOUNTER — Other Ambulatory Visit: Payer: Self-pay

## 2023-03-28 ENCOUNTER — Ambulatory Visit (HOSPITAL_COMMUNITY)
Admission: EM | Admit: 2023-03-28 | Discharge: 2023-03-28 | Disposition: A | Discharge status: Home / Self Care | Payer: Commercial Managed Care - HMO | Attending: Internal Medicine | Admitting: Internal Medicine

## 2023-03-28 ENCOUNTER — Encounter (HOSPITAL_COMMUNITY): Payer: Self-pay | Admitting: *Deleted

## 2023-03-28 DIAGNOSIS — A059 Bacterial foodborne intoxication, unspecified: Secondary | ICD-10-CM

## 2023-03-28 MED ORDER — ONDANSETRON 4 MG PO TBDP: 4.0000 mg | ORAL_TABLET | Freq: Three times a day (TID) | ORAL | 0 refills | Status: DC | PRN

## 2023-03-28 MED ORDER — PANTOPRAZOLE SODIUM 20 MG PO TBEC: 20.0000 mg | DELAYED_RELEASE_TABLET | Freq: Every day | ORAL | 0 refills | Status: DC

## 2023-03-28 NOTE — Discharge Instructions (Addendum)
Your symptoms is likely related to food that you ate yesterday Please take medications as prescribed Increase oral fluid intake If you have worsening symptoms, please return to urgent care to be reevaluated.

## 2023-03-28 NOTE — ED Provider Notes (Signed)
[facility]    CSN: [id number] Arrival date & time: 03/28/23  1019      History   Chief Complaint Chief Complaint  Patient presents with   Diarrhea   Nasal Congestion   Gastroesophageal Reflux   Fever    HPI Peter Bell is a 52 y.o. male comes to the urgent care with crampy abdominal pain, nausea, nonbloody nonbilious vomiting and nonbloody diarrhea which started yesterday.  Patient symptoms started about 2 hours after eating some salad from [employer].  This had several episodes of nonbloody nonbilious vomiting.  He endorses abdominal cramps.  He is also experienced several bouts of diarrhea.  Stool was not bloody or mucoid.  No muscle cramps.  Patient's oral intake is decreased secondary to the vomiting.Marland Kitchen   HPI  Past Medical History:  Diagnosis Date   Asthma    Diabetes mellitus without complication (HCC)    Eczema    GERD (gastroesophageal reflux disease)    Hypertension    Sciatica     Patient Active Problem List   Diagnosis Date Noted   Spinal stenosis of lumbar region 04/22/2020   Acute pain of left knee 09/17/2019   Protrusion of lumbar intervertebral disc 05/29/2019   SOB (shortness of breath) 05/10/2013   Essential hypertension, benign 04/24/2013   Chest pain, unspecified 04/24/2013    Past Surgical History:  Procedure Laterality Date   HERNIA REPAIR         Home Medications    Prior to Admission medications   Medication Sig Start Date End Date Taking? Authorizing Provider  amLODipine (NORVASC) 10 MG tablet Take 1 tablet (10 mg total) by mouth daily. 10/25/22  Yes Naquita Nappier, Britta Mccreedy, MD  ondansetron (ZOFRAN-ODT) 4 MG disintegrating tablet Take 1 tablet (4 mg total) by mouth every 8 (eight) hours as needed for nausea or vomiting. 03/28/23  Yes Criss Bartles, Britta Mccreedy, MD  pantoprazole (PROTONIX) 20 MG tablet Take 1 tablet (20 mg total) by mouth daily. 03/28/23  Yes Derry Kassel, Britta Mccreedy, MD  hydrochlorothiazide (HYDRODIURIL) 25 MG tablet Take 1  tablet (25 mg total) by mouth daily. 04/02/22 05/02/22  Radford Pax, NP  lidocaine (LIDODERM) 5 % Place 1 patch onto the skin daily. Remove & Discard patch within 12 hours or as directed by MD 09/06/22   Raspet, Denny Peon K, PA-C  losartan (COZAAR) 50 MG tablet Take 1 tablet (50 mg total) by mouth daily. 10/25/22 11/24/22  Merrilee Jansky, MD  insulin glargine (LANTUS) 100 UNIT/ML injection Inject 16 Units into the skin daily.  04/14/19  [provider]  metFORMIN (GLUCOPHAGE) 500 MG tablet Take 500 mg by mouth.  04/14/19  [provider]  pravastatin (PRAVACHOL) 40 MG tablet Take 40 mg by mouth daily.  04/14/19  [provider]    Family History Family History  Problem Relation Age of Onset   Cancer Mother    Cancer Father    Hypertension Sister    Heart murmur Sister    CVA Brother    CVA Brother    Hypertension Brother     Social History Social History   Tobacco Use   Smoking status: Never   Smokeless tobacco: Never  Vaping Use   Vaping status: Never Used  Substance Use Topics   Alcohol use: Not Currently   Drug use: No     Allergies   Patient has no known allergies.   Review of Systems Review of Systems As per HPI  Physical Exam Triage  Vital Signs ED Triage Vitals  Encounter Vitals Group     BP 03/28/23 1048 (!) 143/89     Systolic BP Percentile --      Diastolic BP Percentile --      Pulse Rate 03/28/23 1048 76     Resp 03/28/23 1048 20     Temp 03/28/23 1048 98.5 F (36.9 C)     Temp src --      SpO2 03/28/23 1048 97 %     Weight --      Height --      Head Circumference --      Peak Flow --      Pain Score 03/28/23 1045 0     Pain Loc --      Pain Education --      Exclude from Growth Chart --    No data found.  Updated Vital Signs BP (!) 143/89   Pulse 76   Temp 98.5 F (36.9 C)   Resp 20   SpO2 97%   Visual Acuity Right Eye Distance:   Left Eye Distance:   Bilateral Distance:    Right Eye Near:   Left Eye  Near:    Bilateral Near:     Physical Exam Vitals and nursing note reviewed.  Constitutional:      General: He is not in acute distress.    Appearance: He is not ill-appearing.  HENT:     Right Ear: Tympanic membrane normal.     Left Ear: Tympanic membrane normal.     Mouth/Throat:     Mouth: Mucous membranes are moist.  Cardiovascular:     Rate and Rhythm: Normal rate and regular rhythm.     Pulses: Normal pulses.     Heart sounds: Normal heart sounds.  Pulmonary:     Effort: Pulmonary effort is normal.     Breath sounds: Normal breath sounds.  Abdominal:     General: Bowel sounds are normal.     Palpations: Abdomen is soft.     Tenderness: There is no abdominal tenderness. There is no guarding.  Neurological:     Mental Status: He is alert.      UC Treatments / Results  Labs (all labs ordered are listed, but only abnormal results are displayed) Labs Reviewed - No data to display  EKG   Radiology No results found.  Procedures Procedures (including critical care time)  Medications Ordered in UC Medications - No data to display  Initial Impression / Assessment and Plan / UC Course  I have reviewed the triage vital signs and the nursing notes.  Pertinent labs & imaging results that were available during my care of the patient were reviewed by me and considered in my medical decision making (see chart for details).     1.  Food poisoning: Zofran ODT every 6-8 hours as needed for nausea/vomiting Protonix 20 mg orally daily Patient is advised to increase oral fluid intake Return precautions given No indication for lab evaluation since patient is hemodynamically stable. Final Clinical Impressions(s) / UC Diagnoses   Final diagnoses:  Food poisoning     Discharge Instructions      Your symptoms is likely related to food that you ate yesterday Please take medications as prescribed Increase oral fluid intake If you have worsening symptoms, please return  to urgent care to be reevaluated.   ED Prescriptions     Medication Sig Dispense Auth. Provider   ondansetron (ZOFRAN-ODT) 4 MG disintegrating tablet  Take 1 tablet (4 mg total) by mouth every 8 (eight) hours as needed for nausea or vomiting. 20 tablet Syreeta Figler, Britta Mccreedy, MD   pantoprazole (PROTONIX) 20 MG tablet Take 1 tablet (20 mg total) by mouth daily. 14 tablet Dimonique Bourdeau, Britta Mccreedy, MD      PDMP not reviewed this encounter.   Merrilee Jansky, MD 03/28/23 [phone]

## 2023-03-28 NOTE — ED Triage Notes (Signed)
Pt reports he was up all night . Pt has diarrhea,GERD,vomiting.

## 2023-03-30 ENCOUNTER — Encounter (HOSPITAL_COMMUNITY): Payer: Self-pay

## 2023-03-30 ENCOUNTER — Ambulatory Visit (HOSPITAL_COMMUNITY)
Admission: EM | Admit: 2023-03-30 | Discharge: 2023-03-30 | Disposition: A | Discharge status: Home / Self Care | Payer: Commercial Managed Care - HMO | Attending: Physician Assistant | Admitting: Physician Assistant

## 2023-03-30 DIAGNOSIS — A09 Infectious gastroenteritis and colitis, unspecified: Secondary | ICD-10-CM | POA: Diagnosis not present

## 2023-03-30 LAB — CBC WITH DIFFERENTIAL/PLATELET
Abs Immature Granulocytes: 0.03 10*3/uL (ref 0.00–0.07)
Basophils Absolute: 0 10*3/uL (ref 0.0–0.1)
Basophils Relative: 0 %
Eosinophils Absolute: 0.1 10*3/uL (ref 0.0–0.5)
Eosinophils Relative: 1 %
HCT: 39.7 % (ref 39.0–52.0)
Hemoglobin: 13.5 g/dL (ref 13.0–17.0)
Immature Granulocytes: 0 %
Lymphocytes Relative: 22 %
Lymphs Abs: 2.1 10*3/uL (ref 0.7–4.0)
MCH: 31.3 pg (ref 26.0–34.0)
MCHC: 34 g/dL (ref 30.0–36.0)
MCV: 92.1 fL (ref 80.0–100.0)
Monocytes Absolute: 1 10*3/uL (ref 0.1–1.0)
Monocytes Relative: 11 %
Neutro Abs: 6.4 10*3/uL (ref 1.7–7.7)
Neutrophils Relative %: 66 %
Platelets: 265 10*3/uL (ref 150–400)
RBC: 4.31 MIL/uL (ref 4.22–5.81)
RDW: 12.4 % (ref 11.5–15.5)
WBC: 9.7 10*3/uL (ref 4.0–10.5)
nRBC: 0 % (ref 0.0–0.2)

## 2023-03-30 LAB — POCT URINALYSIS DIP (MANUAL ENTRY)
Bilirubin, UA: NEGATIVE
Glucose, UA: NEGATIVE mg/dL
Ketones, POC UA: NEGATIVE mg/dL
Leukocytes, UA: NEGATIVE
Nitrite, UA: NEGATIVE
Protein Ur, POC: 30 mg/dL — AB
Spec Grav, UA: 1.02 (ref 1.010–1.025)
Urobilinogen, UA: 0.2 U/dL
pH, UA: 6 (ref 5.0–8.0)

## 2023-03-30 LAB — COMPREHENSIVE METABOLIC PANEL
ALT: 15 U/L (ref 0–44)
AST: 21 U/L (ref 15–41)
Albumin: 3.5 g/dL (ref 3.5–5.0)
Alkaline Phosphatase: 85 U/L (ref 38–126)
Anion gap: 8 (ref 5–15)
BUN: 8 mg/dL (ref 6–20)
CO2: 22 mmol/L (ref 22–32)
Calcium: 8.5 mg/dL — ABNORMAL LOW (ref 8.9–10.3)
Chloride: 108 mmol/L (ref 98–111)
Creatinine, Ser: 1.02 mg/dL (ref 0.61–1.24)
GFR, Estimated: >60 mL/min (ref >60)
Glucose, Bld: 118 mg/dL — ABNORMAL HIGH (ref 70–99)
Potassium: 3.8 mmol/L (ref 3.5–5.1)
Sodium: 138 mmol/L (ref 135–145)
Total Bilirubin: 1 mg/dL (ref <1.2)
Total Protein: 7.2 g/dL (ref 6.5–8.1)

## 2023-03-30 LAB — C DIFFICILE QUICK SCREEN W PCR REFLEX
C Diff antigen: NEGATIVE
C Diff interpretation: NOT DETECTED
C Diff toxin: NEGATIVE

## 2023-03-30 LAB — GASTROINTESTINAL PANEL BY PCR, STOOL (REPLACES STOOL CULTURE)

## 2023-03-30 NOTE — ED Provider Notes (Signed)
[facility]    CSN: [id number] Arrival date & time: 03/30/23  4098      History   Chief Complaint Chief Complaint  Patient presents with   Diarrhea    HPI Peter Bell is a 52 y.o. male.   Patient presents today with a 3-day history of severe diarrhea.  He was seen by our clinic 1 day after symptoms started (03/28/2023) at which point he was given Zofran and omeprazole.  He reports the nausea and vomiting has resolved but he continues to have significant diarrhea where he is going approximately 2-3 times per hour having difficulty sleeping because of the severity of diarrhea.  His symptoms began after he ate a salad at [employer].  He is also tried over-the-counter Imodium without improvement.  Denies history of gastrointestinal disorder including ulcerative clots or Crohn's disease.  Denies any fever, melena, hematochezia.  Denies any previous abdominal surgeries still has gallbladder and appendix; has only had a hernia repair.  He denies any ongoing abdominal pain but does report discomfort associated with having a bowel movement.    Past Medical History:  Diagnosis Date   Asthma    Diabetes mellitus without complication (HCC)    Eczema    GERD (gastroesophageal reflux disease)    Hypertension    Sciatica     Patient Active Problem List   Diagnosis Date Noted   Spinal stenosis of lumbar region 04/22/2020   Acute pain of left knee 09/17/2019   Protrusion of lumbar intervertebral disc 05/29/2019   SOB (shortness of breath) 05/10/2013   Essential hypertension, benign 04/24/2013   Chest pain, unspecified 04/24/2013    Past Surgical History:  Procedure Laterality Date   HERNIA REPAIR         Home Medications    Prior to Admission medications   Medication Sig Start Date End Date Taking? Authorizing Provider  amLODipine (NORVASC) 10 MG tablet Take 1 tablet (10 mg total) by mouth daily. 10/25/22   Lamptey, Britta Mccreedy, MD  hydrochlorothiazide  (HYDRODIURIL) 25 MG tablet Take 1 tablet (25 mg total) by mouth daily. 04/02/22 05/02/22  Radford Pax, NP  losartan (COZAAR) 50 MG tablet Take 1 tablet (50 mg total) by mouth daily. 10/25/22 11/24/22  Merrilee Jansky, MD  ondansetron (ZOFRAN-ODT) 4 MG disintegrating tablet Take 1 tablet (4 mg total) by mouth every 8 (eight) hours as needed for nausea or vomiting. 03/28/23   Merrilee Jansky, MD  pantoprazole (PROTONIX) 20 MG tablet Take 1 tablet (20 mg total) by mouth daily. 03/28/23   Merrilee Jansky, MD  insulin glargine (LANTUS) 100 UNIT/ML injection Inject 16 Units into the skin daily.  04/14/19  [provider]  metFORMIN (GLUCOPHAGE) 500 MG tablet Take 500 mg by mouth.  04/14/19  [provider]  pravastatin (PRAVACHOL) 40 MG tablet Take 40 mg by mouth daily.  04/14/19  [provider]    Family History Family History  Problem Relation Age of Onset   Cancer Mother    Cancer Father    Hypertension Sister    Heart murmur Sister    CVA Brother    CVA Brother    Hypertension Brother     Social History Social History   Tobacco Use   Smoking status: Never   Smokeless tobacco: Never  Vaping Use   Vaping status: Never Used  Substance Use Topics   Alcohol use: Not Currently   Drug use: No     Allergies  Patient has no known allergies.   Review of Systems Review of Systems  Constitutional:  Positive for activity change. Negative for appetite change, fatigue and fever.  Respiratory:  Negative for cough and shortness of breath.   Cardiovascular:  Negative for chest pain.  Gastrointestinal:  Positive for abdominal pain and diarrhea. Negative for blood in stool, constipation, nausea, rectal pain and vomiting.  Musculoskeletal:  Negative for arthralgias and myalgias.     Physical Exam Triage Vital Signs ED Triage Vitals  Encounter Vitals Group     BP 03/30/23 0953 (!) 145/94     Systolic BP Percentile --      Diastolic BP Percentile --       Pulse Rate 03/30/23 0953 69     Resp 03/30/23 0953 18     Temp 03/30/23 0953 98.4 F (36.9 C)     Temp Source 03/30/23 0953 Oral     SpO2 03/30/23 0953 98 %     Weight 03/30/23 0952 140 lb (63.5 kg)     Height --      Head Circumference --      Peak Flow --      Pain Score 03/30/23 0952 0     Pain Loc --      Pain Education --      Exclude from Growth Chart --    No data found.  Updated Vital Signs BP (!) 145/94 (BP Location: Left Arm)   Pulse 69   Temp 98.4 F (36.9 C) (Oral)   Resp 18   Wt 140 lb (63.5 kg)   SpO2 98%   BMI 23.30 kg/m   Visual Acuity Right Eye Distance:   Left Eye Distance:   Bilateral Distance:    Right Eye Near:   Left Eye Near:    Bilateral Near:     Physical Exam Vitals reviewed.  Constitutional:      General: He is awake.     Appearance: Normal appearance. He is well-developed. He is not ill-appearing.     Comments: Very pleasant male appears stated age in no acute distress sitting comfortably in exam room  HENT:     Head: Normocephalic and atraumatic.     Mouth/Throat:     Pharynx: No oropharyngeal exudate, posterior oropharyngeal erythema or uvula swelling.  Cardiovascular:     Rate and Rhythm: Normal rate and regular rhythm.     Heart sounds: Normal heart sounds, S1 normal and S2 normal. No murmur heard. Pulmonary:     Effort: Pulmonary effort is normal.     Breath sounds: Normal breath sounds. No stridor. No wheezing, rhonchi or rales.     Comments: Clear to auscultation bilaterally Abdominal:     General: Bowel sounds are normal.     Palpations: Abdomen is soft.     Tenderness: There is no abdominal tenderness. There is no right CVA tenderness, left CVA tenderness, guarding or rebound.     Comments: Benign abdominal exam  Neurological:     Mental Status: He is alert.  Psychiatric:        Behavior: Behavior is cooperative.      UC Treatments / Results  Labs (all labs ordered are listed, but only abnormal results are  displayed) Labs Reviewed  POCT URINALYSIS DIP (MANUAL ENTRY) - Abnormal; Notable for the following components:      Result Value   Blood, UA trace-lysed (*)    Protein Ur, POC =30 (*)    All other components within normal limits  GASTROINTESTINAL PANEL BY PCR, STOOL (REPLACES STOOL CULTURE)  C DIFFICILE QUICK SCREEN W PCR REFLEX    COMPREHENSIVE METABOLIC PANEL  CBC WITH DIFFERENTIAL/PLATELET    EKG   Radiology No results found.  Procedures Procedures (including critical care time)  Medications Ordered in UC Medications - No data to display  Initial Impression / Assessment and Plan / UC Course  I have reviewed the triage vital signs and the nursing notes.  Pertinent labs & imaging results that were available during my care of the patient were reviewed by me and considered in my medical decision making (see chart for details).     Patient is well-appearing, afebrile, nontoxic, nontachycardic.  Vital signs of physical exam are reassuring with no indication for emergent evaluation or imaging.  Urine was obtained that showed no significant dehydration.  Given severity of diarrhea will obtain stool studies which are pending.  Basic blood work including CBC, CMP, lipase obtained as well we will contact him if this is abnormal.  Recommended that he push fluids to avoid dehydration given the volume of diarrhea.  He was also encouraged to eat a bland diet and avoid spicy/acidic/fatty foods.  We discussed that if his testing comes back negative and he continues to have symptoms he should be reevaluated.  If anything worsens and he has severe abdominal pain, fever, nausea, vomiting, melena, hematochezia, hematemesis he needs to be seen emergently.  Strict return precautions given.  Work excuse note provided.  Final Clinical Impressions(s) / UC Diagnoses   Final diagnoses:  Infectious diarrhea     Discharge Instructions      I will contact you with your stool and blood testing as soon  as I have these results.  Eat a bland diet and drink plenty of fluid.  Avoid spicy/acidic/fatty foods.  If you develop any severe abdominal pain, blood in your stool, nausea/vomiting interfering with oral intake, fever, weakness you need to go to the emergency room.     ED Prescriptions   None    PDMP not reviewed this encounter.   Jeani Hawking, PA-C 03/30/23 1041

## 2023-03-30 NOTE — ED Notes (Signed)
This RN provided pt two sample cups to obtain urine and stool. Pt will attempt in a few minutes.

## 2023-03-30 NOTE — Discharge Instructions (Signed)
I will contact you with your stool and blood testing as soon as I have these results.  Eat a bland diet and drink plenty of fluid.  Avoid spicy/acidic/fatty foods.  If you develop any severe abdominal pain, blood in your stool, nausea/vomiting interfering with oral intake, fever, weakness you need to go to the emergency room.

## 2023-03-30 NOTE — ED Triage Notes (Signed)
Pt presents with diarrhea x 3 days after eating a salad from [employer]. Pt reports taking Imodium since diarrhea began with little to no improvement. Pt was able to eat chicken soup last night. Pt denies pain, just uncomfortable. Pt took "a couple doses of prescribed anti-nausea medicine" with no improvement.

## 2023-03-30 NOTE — ED Notes (Signed)
Room call x 1. Pt currently in restroom.

## 2023-03-31 ENCOUNTER — Ambulatory Visit (INDEPENDENT_AMBULATORY_CARE_PROVIDER_SITE_OTHER): Payer: Commercial Managed Care - HMO | Admitting: Family Medicine

## 2023-03-31 ENCOUNTER — Encounter (HOSPITAL_BASED_OUTPATIENT_CLINIC_OR_DEPARTMENT_OTHER): Payer: Self-pay | Admitting: Family Medicine

## 2023-03-31 VITALS — BP 134/76 | HR 71 | Temp 98.4°F | Ht 65.0 in | Wt 137.3 lb

## 2023-03-31 DIAGNOSIS — Z7689 Persons encountering health services in other specified circumstances: Secondary | ICD-10-CM

## 2023-03-31 DIAGNOSIS — E1165 Type 2 diabetes mellitus with hyperglycemia: Secondary | ICD-10-CM | POA: Insufficient documentation

## 2023-03-31 DIAGNOSIS — Z1211 Encounter for screening for malignant neoplasm of colon: Secondary | ICD-10-CM

## 2023-03-31 DIAGNOSIS — Z125 Encounter for screening for malignant neoplasm of prostate: Secondary | ICD-10-CM

## 2023-03-31 DIAGNOSIS — R269 Unspecified abnormalities of gait and mobility: Secondary | ICD-10-CM

## 2023-03-31 DIAGNOSIS — M792 Neuralgia and neuritis, unspecified: Secondary | ICD-10-CM | POA: Diagnosis not present

## 2023-03-31 MED ORDER — GABAPENTIN 100 MG PO CAPS: 100.0000 mg | ORAL_CAPSULE | Freq: Two times a day (BID) | ORAL | 3 refills | Status: DC
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
## 2023-04-01 LAB — LIPID PANEL
Chol/HDL Ratio: 3.7 {ratio} (ref 0.0–5.0)
Cholesterol, Total: 108 mg/dL (ref 100–199)
HDL: 29 mg/dL — ABNORMAL LOW (ref >39)
LDL Chol Calc (NIH): 66 mg/dL (ref 0–99)
Triglycerides: 54 mg/dL (ref 0–149)
VLDL Cholesterol Cal: 13 mg/dL (ref 5–40)

## 2023-04-01 LAB — PSA: Prostate Specific Ag, Serum: 0.6 ng/mL (ref 0.0–4.0)

## 2023-04-01 LAB — HEMOGLOBIN A1C
Est. average glucose Bld gHb Est-mCnc: 169 mg/dL
Hgb A1c MFr Bld: 7.5 % — ABNORMAL HIGH (ref 4.8–5.6)

## 2023-04-01 NOTE — Progress Notes (Signed)
Please let patient know [name], Your lab work did show that you are in the diabetic category with your A1c being over 6.4.  Your PSA was normal.  Your cholesterol was also normal, however your good cholesterol is slightly low.  For your diabetes, I would recommend starting on medication such as metformin, making dietary changes with decrease in sugars, carbohydrates, and fats.  Adhering to a diet of lean protein, veggies, fiber, and regular exercise will also help with your diabetes and cholesterol.  We will discuss this also at your next visit in further detail.  If you would like to go ahead and start medication please let me know and I will send in

## 2023-04-04 ENCOUNTER — Telehealth (HOSPITAL_BASED_OUTPATIENT_CLINIC_OR_DEPARTMENT_OTHER): Payer: Self-pay

## 2023-04-04 ENCOUNTER — Other Ambulatory Visit (HOSPITAL_BASED_OUTPATIENT_CLINIC_OR_DEPARTMENT_OTHER): Payer: Self-pay | Admitting: Family Medicine

## 2023-04-04 MED ORDER — AMLODIPINE BESYLATE 10 MG PO TABS: 10.0000 mg | ORAL_TABLET | Freq: Every day | ORAL | 1 refills | Status: DC

## 2023-04-04 NOTE — Telephone Encounter (Signed)
Copied from CRM [id number]. Topic: Clinical - Medication Refill >> Apr 04, 2023  2:55 PM Dimitri Ped wrote: Most Recent Primary Care Visit:  Provider: Hilbert Bible  Department: [facility]  Visit Type: NEW PATIENT  Date: 03/31/2023  Medication: ***  Has the patient contacted their pharmacy?  (Agent: If no, request that the patient contact the pharmacy for the refill. If patient does not wish to contact the pharmacy document the reason why and proceed with request.) (Agent: If yes, when and what did the pharmacy advise?)  Is this the correct pharmacy for this prescription?  If no, delete pharmacy and type the correct one.  This is the patient's preferred pharmacy:  [id number] - [place], [place] - [address] [place] [address] Phone: [phone] Fax: [phone]  [id number] - [place] - [address] [facility] [address] [place] [address] Phone: [phone] Fax: [phone]   Has the prescription been filled recently?   Is the patient out of the medication?   Has the patient been seen for an appointment in the last year OR does the patient have an upcoming appointment?   Can we respond through MyChart?   Agent: Please be advised that Rx refills may take up to 3 business days. We ask that you follow-up with your pharmacy.

## 2023-04-04 NOTE — Telephone Encounter (Signed)
Copied from CRM [id number]. Topic: Clinical - Medication Question >> Apr 04, 2023  3:01 PM Whitney O wrote: Reason for CRM: patient is saying that a medication he is taking doesn't feel like its working .gabapentin (NEURONTIN) 100 MG capsule

## 2023-04-04 NOTE — Telephone Encounter (Signed)
LVM for patient to return call. 

## 2023-04-06 ENCOUNTER — Telehealth (HOSPITAL_BASED_OUTPATIENT_CLINIC_OR_DEPARTMENT_OTHER): Payer: Self-pay | Admitting: Family Medicine

## 2023-04-06 NOTE — Telephone Encounter (Signed)
Refer to lab result tab.

## 2023-04-06 NOTE — Telephone Encounter (Signed)
Pt called back stating he missed a call about his lab results please contact pt

## 2023-04-11 ENCOUNTER — Ambulatory Visit (HOSPITAL_BASED_OUTPATIENT_CLINIC_OR_DEPARTMENT_OTHER): Payer: Commercial Managed Care - HMO | Admitting: Family Medicine

## 2023-04-17 ENCOUNTER — Encounter (HOSPITAL_COMMUNITY): Payer: Self-pay | Admitting: Emergency Medicine

## 2023-04-17 ENCOUNTER — Ambulatory Visit (HOSPITAL_COMMUNITY)
Admission: EM | Admit: 2023-04-17 | Discharge: 2023-04-17 | Disposition: A | Discharge status: Home / Self Care | Payer: Commercial Managed Care - HMO | Attending: Nurse Practitioner | Admitting: Nurse Practitioner

## 2023-04-17 DIAGNOSIS — R42 Dizziness and giddiness: Secondary | ICD-10-CM | POA: Diagnosis not present

## 2023-04-17 DIAGNOSIS — R739 Hyperglycemia, unspecified: Secondary | ICD-10-CM | POA: Diagnosis not present

## 2023-04-17 DIAGNOSIS — I10 Essential (primary) hypertension: Secondary | ICD-10-CM | POA: Diagnosis not present

## 2023-04-17 LAB — POCT FASTING CBG KUC MANUAL ENTRY: POCT Glucose (KUC): 220 mg/dL — AB (ref 70–99)

## 2023-04-17 NOTE — Discharge Instructions (Signed)
As we discussed, EKG test today looks good when I compared it with your previous EKG.  Your vital signs are stable, however blood pressure is elevated, likely because you have not been taking the blood pressure medication.  Please resume the blood pressure medication as soon as you are able.  Your blood sugar is elevated today which is consistent with diabetes, recommend avoiding sugary substances and simple carbohydrates and close follow-up with primary care provider as planned.  If dizziness worsens or does not improve after resuming your blood pressure medication, seek care.

## 2023-04-17 NOTE — ED Provider Notes (Signed)
[facility]    CSN: [id number] Arrival date & time: 04/17/23  1042      History   Chief Complaint Chief Complaint  Patient presents with   Dizziness   Hypertension    HPI Peter Bell is a 52 y.o. male.   Patient presents today with 3 to 4-day history of dizziness.  Reports symptoms began after running out of his blood pressure medication.  Reports symptoms are worse when he changes positions, denies symptoms with head movement, bending over, or rolling over in bed.  No room spinning sensation.  Denies recent fall, trauma, or injury to his head.  No recent viral symptoms, nausea or vomiting associated with the dizziness, hearing loss, or tinnitus.  No headache, unsteady gait, postural instability, double vision or blurry vision, trouble with speech, trouble swallowing, chest pain, or shortness of breath associated with the dizziness.  Patient has visit with primary care provider for follow-up coming up.  He was recently diagnosed with type 2 diabetes, reports he has had this in the past and was able to control his blood sugar with diet.  Last night, drink a 16 ounce Pepsi.  Reports his blood pressure medication was on hold at the pharmacy for an unknown reason, however he is able to pick it up today and is planning to pick it up after urgent care.    Past Medical History:  Diagnosis Date   Asthma    Diabetes mellitus without complication (HCC)    Eczema    GERD (gastroesophageal reflux disease)    Hypertension    Sciatica     Patient Active Problem List   Diagnosis Date Noted   Impaired gait 03/31/2023   Type 2 diabetes mellitus with hyperglycemia, without long-term current use of insulin (HCC) 03/31/2023   Neuropathic pain 03/31/2023   Spinal stenosis of lumbar region 04/22/2020   Acute pain of left knee 09/17/2019   Protrusion of lumbar intervertebral disc 05/29/2019   SOB (shortness of breath) 05/10/2013   Essential hypertension, benign 04/24/2013    Chest pain, unspecified 04/24/2013    Past Surgical History:  Procedure Laterality Date   HERNIA REPAIR         Home Medications    Prior to Admission medications   Medication Sig Start Date End Date Taking? Authorizing Provider  amLODipine (NORVASC) 10 MG tablet Take 1 tablet (10 mg total) by mouth daily. 04/04/23   Caudle, Shelton Silvas, FNP  gabapentin (NEURONTIN) 100 MG capsule Take 1 capsule (100 mg total) by mouth 2 (two) times daily. 03/31/23   Caudle, Shelton Silvas, FNP  pantoprazole (PROTONIX) 20 MG tablet Take 1 tablet (20 mg total) by mouth daily. 03/28/23   Merrilee Jansky, MD  insulin glargine (LANTUS) 100 UNIT/ML injection Inject 16 Units into the skin daily.  04/14/19  [provider]  metFORMIN (GLUCOPHAGE) 500 MG tablet Take 500 mg by mouth.  04/14/19  [provider]  pravastatin (PRAVACHOL) 40 MG tablet Take 40 mg by mouth daily.  04/14/19  [provider]    Family History Family History  Problem Relation Age of Onset   Cancer Mother    Cancer Father    Hypertension Sister    Heart murmur Sister    CVA Brother    CVA Brother    Hypertension Brother     Social History Social History   Tobacco Use   Smoking status: Never   Smokeless tobacco: Never  Vaping Use   Vaping status: Never  Used  Substance Use Topics   Alcohol use: Not Currently   Drug use: No     Allergies   Patient has no known allergies.   Review of Systems Review of Systems Per HPI  Physical Exam Triage Vital Signs ED Triage Vitals [04/17/23 1057]  Encounter Vitals Group     BP (!) 163/103     Systolic BP Percentile      Diastolic BP Percentile      Pulse Rate 61     Resp 16     Temp 98.2 F (36.8 C)     Temp Source Oral     SpO2 97 %     Weight      Height      Head Circumference      Peak Flow      Pain Score 0     Pain Loc      Pain Education      Exclude from Growth Chart    Orthostatic VS for the past 24 hrs:  BP- Lying Pulse-  Lying BP- Sitting Pulse- Sitting BP- Standing at 0 minutes Pulse- Standing at 0 minutes  04/17/23 1133 (!) 176/97 60 (!) 187/91 62 (!) 166/93 71    Updated Vital Signs BP (!) 163/103 (BP Location: Right Arm)   Pulse 61   Temp 98.2 F (36.8 C) (Oral)   Resp 16   SpO2 97%   Visual Acuity Right Eye Distance:   Left Eye Distance:   Bilateral Distance:    Right Eye Near:   Left Eye Near:    Bilateral Near:     Physical Exam Vitals and nursing note reviewed.  Constitutional:      General: He is not in acute distress.    Appearance: Normal appearance. He is not ill-appearing, toxic-appearing or diaphoretic.  HENT:     Head: Normocephalic and atraumatic.     Right Ear: Tympanic membrane, ear canal and external ear normal. There is no impacted cerumen.     Left Ear: Tympanic membrane, ear canal and external ear normal. There is no impacted cerumen.     Nose: Nose normal. No congestion or rhinorrhea.     Mouth/Throat:     Mouth: Mucous membranes are moist.     Pharynx: Oropharynx is clear. No posterior oropharyngeal erythema.  Eyes:     General: No scleral icterus.    Extraocular Movements: Extraocular movements intact.     Pupils: Pupils are equal, round, and reactive to light.  Cardiovascular:     Rate and Rhythm: Normal rate and regular rhythm.     Heart sounds: No murmur heard. Pulmonary:     Effort: Pulmonary effort is normal. No respiratory distress.     Breath sounds: Normal breath sounds. No wheezing, rhonchi or rales.  Abdominal:     General: Abdomen is flat. Bowel sounds are normal. There is no distension.     Palpations: Abdomen is soft.     Tenderness: There is no abdominal tenderness. There is no right CVA tenderness, left CVA tenderness or guarding.  Musculoskeletal:     Cervical back: Normal range of motion and neck supple. No rigidity or tenderness.  Lymphadenopathy:     Cervical: No cervical adenopathy.  Skin:    General: Skin is warm and dry.      Capillary Refill: Capillary refill takes less than 2 seconds.     Coloration: Skin is not jaundiced or pale.     Findings: No erythema.  Neurological:  General: No focal deficit present.     Mental Status: He is alert and oriented to person, place, and time.     Cranial Nerves: Cranial nerves 2-12 are intact.     Sensory: Sensation is intact.     Motor: No weakness.     Coordination: Coordination is intact. Coordination normal. Heel to [facility] Test normal. Rapid alternating movements normal.     Gait: Gait is intact. Gait normal.  Psychiatric:        Behavior: Behavior is cooperative.      UC Treatments / Results  Labs (all labs ordered are listed, but only abnormal results are displayed) Labs Reviewed  POCT FASTING CBG [facility] MANUAL ENTRY - Abnormal; Notable for the following components:      Result Value   POCT Glucose ([facility]) 220 (*)    All other components within normal limits    EKG   Radiology No results found.  Procedures Procedures (including critical care time)  Medications Ordered in UC Medications - No data to display  Initial Impression / Assessment and Plan / UC Course  I have reviewed the triage vital signs and the nursing notes.  Pertinent labs & imaging results that were available during my care of the patient were reviewed by me and considered in my medical decision making (see chart for details).   Patient is well-appearing, afebrile, not tachycardic, not tachypneic, oxygenating well on room air.  Patient is hypertensive in urgent care today.  1. Hypertension, unspecified type 2. Elevated blood sugar 3. Dizziness Overall, vitals and exam are reassuring Ongoing hypertension due to out of blood pressure medication for past 3 to 4 days EKG today is reassuring, 60 bpm without significant ST segment T wave changes from previous EKG in 2023 Nonfasting blood sugars elevated, we discussed this and the recommended limiting sweets in diet, he has follow-up with  primary care provider in 1 week's time Orthostatic vital signs are negative Recommended resuming blood pressure medication, strict ER precautions and return precautions discussed with patient  The patient was given the opportunity to ask questions.  All questions answered to their satisfaction.  The patient is in agreement to this plan.   Final Clinical Impressions(s) / UC Diagnoses   Final diagnoses:  Hypertension, unspecified type  Elevated blood sugar  Dizziness     Discharge Instructions      As we discussed, EKG test today looks good when I compared it with your previous EKG.  Your vital signs are stable, however blood pressure is elevated, likely because you have not been taking the blood pressure medication.  Please resume the blood pressure medication as soon as you are able.  Your blood sugar is elevated today which is consistent with diabetes, recommend avoiding sugary substances and simple carbohydrates and close follow-up with primary care provider as planned.  If dizziness worsens or does not improve after resuming your blood pressure medication, seek care.    ED Prescriptions   None    PDMP not reviewed this encounter.   Valentino Nose, NP 04/17/23 1215

## 2023-04-17 NOTE — ED Triage Notes (Signed)
Pt reports that he has been out of his HTN meds for about 4 days bc his medication has had a hold on it at the pharmacy for reason he doesn't know. Pt reports that he can pick up his HTN meds today. Reports dizziness esp when changes position.

## 2023-04-28 ENCOUNTER — Ambulatory Visit (HOSPITAL_BASED_OUTPATIENT_CLINIC_OR_DEPARTMENT_OTHER): Payer: Commercial Managed Care - HMO | Admitting: Family Medicine

## 2023-04-28 ENCOUNTER — Encounter (HOSPITAL_BASED_OUTPATIENT_CLINIC_OR_DEPARTMENT_OTHER): Payer: Self-pay | Admitting: Family Medicine

## 2023-04-28 VITALS — BP 144/88 | HR 60 | Ht 65.0 in | Wt 143.2 lb

## 2023-04-28 DIAGNOSIS — R269 Unspecified abnormalities of gait and mobility: Secondary | ICD-10-CM | POA: Diagnosis not present

## 2023-04-28 DIAGNOSIS — I10 Essential (primary) hypertension: Secondary | ICD-10-CM | POA: Diagnosis not present

## 2023-04-28 DIAGNOSIS — M792 Neuralgia and neuritis, unspecified: Secondary | ICD-10-CM

## 2023-04-28 DIAGNOSIS — Z0000 Encounter for general adult medical examination without abnormal findings: Secondary | ICD-10-CM | POA: Diagnosis not present

## 2023-04-28 DIAGNOSIS — E1165 Type 2 diabetes mellitus with hyperglycemia: Secondary | ICD-10-CM | POA: Diagnosis not present

## 2023-04-28 DIAGNOSIS — Z1211 Encounter for screening for malignant neoplasm of colon: Secondary | ICD-10-CM

## 2023-04-28 LAB — POCT UA - MICROALBUMIN
Albumin/Creatinine Ratio, Urine, POC: <30
Creatinine, POC: 200 mg/dL
Microalbumin Ur, POC: 30 mg/L

## 2023-04-28 MED ORDER — LANCETS MISC. MISC: 1.0000 | Freq: Three times a day (TID) | 3 refills | Status: AC

## 2023-04-28 MED ORDER — LANCET DEVICE MISC: 1.0000 | Freq: Three times a day (TID) | 0 refills | Status: AC

## 2023-04-28 MED ORDER — GLIPIZIDE ER 5 MG PO TB24: 5.0000 mg | ORAL_TABLET | Freq: Every day | ORAL | 3 refills | Status: DC

## 2023-04-28 MED ORDER — AMLODIPINE BESYLATE 10 MG PO TABS: 10.0000 mg | ORAL_TABLET | Freq: Every day | ORAL | 3 refills | Status: DC

## 2023-04-28 MED ORDER — BLOOD GLUCOSE TEST VI STRP: 1.0000 | ORAL_STRIP | Freq: Three times a day (TID) | 4 refills | Status: DC

## 2023-04-28 MED ORDER — LOSARTAN POTASSIUM 25 MG PO TABS: 12.5000 mg | ORAL_TABLET | Freq: Every day | ORAL | 3 refills | Status: DC

## 2023-04-28 MED ORDER — BLOOD GLUCOSE MONITORING SUPPL DEVI: 1.0000 | Freq: Three times a day (TID) | 0 refills | Status: DC

## 2023-04-28 NOTE — Progress Notes (Signed)
 Subjective:   Peter Bell 1971/01/26 04/28/2023  CC: Chief Complaint  Patient presents with   Annual Exam    Patient is here today for his physical. Denies any real concerns.    HPI: Peter Bell is a 53 y.o. male who presents for a routine health maintenance exam.     HYPERTENSION: Peter Bell presents for the medical management of hypertension.  Patient's current hypertension medication regimen is: Amlodipine  10mg  Patient is  currently taking prescribed medications for HTN.  Patient is  regularly keeping a check on BP at home.  Adhering to low sodium diet: Yes Exercising Regularly: yes Denies headache, dizziness, CP, SHOB, vision changes.    BP Readings from Last 3 Encounters:  04/28/23 (!) 144/88  04/17/23 (!) 163/103  03/31/23 134/76   DIABETES MELLITUS: Peter Bell presents for the medical management of diabetes.  Current diabetes medication regimen: None; previously on metformin  and Lantus.  Patient is  adhering to a diabetic diet.  Patient is  exercising regularly.  Patient is not checking BS regularly.  Patient is  checking their feet regularly.  Denies polydipsia, polyphagia, polyuria, open wounds or ulcers on feet.   Patient tried gabapentin  for possible lower extremity neuropathy that is impairing gait and denies improvement with numbness or tingling. He did try the gabapentin  and noticed mild improvement of sensation, but did not help improvement with pain or gait.   Lab Results  Component Value Date   HGBA1C 7.5 (H) 03/31/2023    03/31/2023 Lab Results  Component Value Date   MICROALBUR 30 04/28/2023    Wt Readings from Last 3 Encounters:  04/28/23 143 lb 3.2 oz (65 kg)  03/31/23 137 lb 4.8 oz (62.3 kg)  03/30/23 140 lb (63.5 kg)    HEALTH SCREENINGS: - Vision Screening:  Recommended w/ DM - Dental Visits:  Recommended - Testicular Exam: Declined - STD Screening: Declined - PSA (50+): Up to date   Lab Results   Component Value Date   PSA1 0.6 03/31/2023     - Colonoscopy (45+): Ordered today  Discussed with patient purpose of the colonoscopy is to detect colon cancer at curable precancerous or early stages  - AAA Screening: Not applicable  Men age 61-75 who have ever smoked - Lung Cancer screening with low-dose CT: Not applicable-  Adults age 51-80 who are current cigarette smokers or quit within the last 15 years. Must have 20 pack year history.   Depression and Anxiety Screen done today and results listed below:     04/28/2023    8:37 AM 03/31/2023    9:05 AM 02/04/2015    1:56 PM 01/28/2015   12:37 PM 01/21/2015    5:36 PM  Depression screen PHQ 2/9  Decreased Interest 0 1 0 0 0  Down, Depressed, Hopeless 0 1 0 0 0  PHQ - 2 Score 0 2 0 0 0  Altered sleeping 0 1     Tired, decreased energy 2 1     Change in appetite 1 0     Feeling bad or failure about yourself  0 0     Trouble concentrating 0 0     Moving slowly or fidgety/restless 1 0     Suicidal thoughts 0 0     PHQ-9 Score 4 4     Difficult doing work/chores Somewhat difficult Somewhat difficult         04/28/2023    8:38 AM 03/31/2023    9:07 AM  GAD 7 : Generalized Anxiety Score  Nervous, Anxious, on Edge 1 1  Control/stop worrying 1 1  Worry too much - different things 1 1  Trouble relaxing 1 1  Restless 1 1  Easily annoyed or irritable 1 1  Afraid - awful might happen 0 1  Total GAD 7 Score 6 7  Anxiety Difficulty Somewhat difficult Somewhat difficult    IMMUNIZATIONS:  - Tdap: Tetanus vaccination status reviewed: last tetanus booster within 10 years. - Influenza: Refused - Pneumovax: Not applicable - Prevnar: Not applicable - Shingrix vaccine (50+): Refused   Past medical history, surgical history, medications, allergies, family history and social history reviewed with patient today and changes made to appropriate areas of the chart.   Past Medical History:  Diagnosis Date   Asthma    Diabetes mellitus  without complication (HCC)    Eczema    GERD (gastroesophageal reflux disease)    Hypertension    Sciatica     Past Surgical History:  Procedure Laterality Date   HERNIA REPAIR      Current Outpatient Medications on File Prior to Visit  Medication Sig   gabapentin  (NEURONTIN ) 100 MG capsule Take 1 capsule (100 mg total) by mouth 2 (two) times daily.   pantoprazole  (PROTONIX ) 20 MG tablet Take 1 tablet (20 mg total) by mouth daily.   [DISCONTINUED] insulin  glargine (LANTUS) 100 UNIT/ML injection Inject 16 Units into the skin daily.   [DISCONTINUED] metFORMIN  (GLUCOPHAGE ) 500 MG tablet Take 500 mg by mouth.   [DISCONTINUED] pravastatin (PRAVACHOL) 40 MG tablet Take 40 mg by mouth daily.   No current facility-administered medications on file prior to visit.    No Known Allergies   Social History   Socioeconomic History   Marital status: Divorced    Spouse name: Not on file   Number of children: Not on file   Years of education: Not on file   Highest education level: Not on file  Occupational History   Not on file  Tobacco Use   Smoking status: Never   Smokeless tobacco: Never  Vaping Use   Vaping status: Never Used  Substance and Sexual Activity   Alcohol use: Not Currently   Drug use: No   Sexual activity: Not on file  Other Topics Concern   Not on file  Social History Narrative   Not on file   Social Drivers of Health   Financial Resource Strain: High Risk (04/28/2023)   Overall Financial Resource Strain (CARDIA)    Difficulty of Paying Living Expenses: Very hard  Food Insecurity: No Food Insecurity (04/28/2023)   Hunger Vital Sign    Worried About Running Out of Food in the Last Year: Never true    Ran Out of Food in the Last Year: Never true  Transportation Needs: No Transportation Needs (04/28/2023)   PRAPARE - Administrator, Civil Service (Medical): No    Lack of Transportation (Non-Medical): No  Physical Activity: Inactive (04/28/2023)    Exercise Vital Sign    Days of Exercise per Week: 0 days    Minutes of Exercise per Session: 0 min  Stress: No Stress Concern Present (04/28/2023)   [employer] of Occupational Health - Occupational Stress Questionnaire    Feeling of Stress : Only a little  Social Connections: Socially Isolated (04/28/2023)   Social Connection and Isolation Panel [NHANES]    Frequency of Communication with Friends and Family: More than three times a week    Frequency of Social  Gatherings with Friends and Family: Never    Attends Religious Services: Never    Database Administrator or Organizations: No    Attends Banker Meetings: Never    Marital Status: Divorced  Catering Manager Violence: Not At Risk (04/28/2023)   Humiliation, Afraid, Rape, and Kick questionnaire    Fear of Current or Ex-Partner: No    Emotionally Abused: No    Physically Abused: No    Sexually Abused: No   Social History   Tobacco Use  Smoking Status Never  Smokeless Tobacco Never   Social History   Substance and Sexual Activity  Alcohol Use Not Currently     Family History  Problem Relation Age of Onset   Cancer Mother    Cancer Father    Hypertension Sister    Heart murmur Sister    CVA Brother    CVA Brother    Hypertension Brother      ROS: Denies fever, fatigue, unexplained weight loss/gain, CP, SHOB, and palpatitations. Denies neurological deficits, gastrointestinal and/or genitourinary complaints, and skin changes.   Objective:   Today's Vitals   04/28/23 0831 04/28/23 0840  BP: (!) 142/89 (!) 144/88  Pulse: 60   SpO2: 100%   Weight: 143 lb 3.2 oz (65 kg)   Height: 5' 5 (1.651 m)     GENERAL APPEARANCE: Well-appearing, in NAD. Well nourished.  SKIN: Pink, warm and dry. Turgor normal. No rash, lesion, ulceration, or ecchymoses. Hair evenly distributed.  HEENT: HEAD: Normocephalic.  EYES: PERRLA. EOMI. Lids intact w/o defect. Sclera white, Conjunctiva pink w/o exudate.  EARS:  External ear w/o redness, swelling, masses or lesions. EAC clear. TM's intact, translucent w/o bulging, appropriate landmarks visualized. Appropriate acuity to conversational tones.  NOSE: Septum midline w/o deformity. Nares patent, mucosa pink and non-inflamed w/o drainage. No sinus tenderness.  THROAT: Uvula midline. Oropharynx clear. Tonsils non-inflamed w/o exudate. Oral mucosa pink and moist.  NECK: Supple, Trachea midline. Full ROM w/o pain or tenderness. No lymphadenopathy. Thyroid  non-tender w/o enlargement or palpable masses.  RESPIRATORY: Chest wall symmetrical w/o masses. Respirations even and non-labored. Breath sounds clear to auscultation bilaterally. No wheezes, rales, rhonchi, or crackles. CARDIAC: S1, S2 present, regular rate and rhythm. No gallops, murmurs, rubs, or clicks. PMI w/o lifts, heaves, or thrills. No carotid bruits. Capillary refill <2 seconds. Peripheral pulses 2+ bilaterally. GI: Abdomen soft w/o distention. Normoactive bowel sounds. No palpable masses or tenderness. No guarding or rebound tenderness. Liver and spleen w/o tenderness or enlargement. No CVA tenderness.  GU: Pt deferred exam. MSK: Muscle tone and strength appropriate for age. Joints w/o tenderness, redness, or swelling.  No point tenderness to right hip, knee or ankle joints.  No swelling or edema present to joints of right lower extremity.  Full range of motion present to right lower extremity. EXTREMITIES: Without clubbing, cyanosis, or edema.  NEUROLOGIC: No motor or sensory deficits.  Gait mildly impaired with limp of right lower extremity.  C2-C12 intact.  PSYCH/MENTAL STATUS: Alert, oriented x 3. Cooperative, appropriate mood and affect.     Assessment & Plan:  1. Annual physical exam (Primary) Discussed patient's lab results in depth and reviewed health maintenance and routine vaccines.   Patient declines routine vaccines. We discussed the risk of this means means there could be underlying issue  or malignancy not identified, increased morbidity, and even mortality. Patient verbalized understanding and acceptance of risk. Patient knows they can change their mind at any time and we will be happy  to coordinate these things for them.    2. Screening for colon cancer Referral placed to GI for routine colonoscopy per age-related screening - Ambulatory referral to Gastroenterology  3. Impaired gait 4. Neuropathic pain Pain may be caused due to lumbar or sacral nerve impingement.  Recommend patient continue with gabapentin  currently and be evaluated by orthopedics with likely imaging needed and possible PT.  Patient agreeable to this plan of care and referral placed - Ambulatory referral to Orthopedic Surgery  5. Essential hypertension, benign Uncontrolled, continue on amlodipine  10 mg daily and start losartan  12.5 mg daily.  Patient to continue to monitor blood pressure with goal of less than 130/80.  Discussed regular exercise, nutrition and monitoring salt intake.  He will return in 6 weeks for a blood pressure check.  CMP recently reviewed. - losartan  (COZAAR ) 25 MG tablet; Take 0.5 tablets (12.5 mg total) by mouth daily.  Dispense: 90 tablet; Refill: 3 - amLODipine  (NORVASC ) 10 MG tablet; Take 1 tablet (10 mg total) by mouth daily.  Dispense: 90 tablet; Refill: 3  6. Type 2 diabetes mellitus with hyperglycemia, without long-term current use of insulin  (HCC) Uncontrolled, recommend patient start on GLP-1 or metformin .  Patient declined.  He would like to start on glipizide  5 mg XR daily with breakfast.  PCP agreeable to this and discussed possible side effects with hypoglycemia.  Patient verbalized understanding and will begin to check his blood glucose more regularly.  Meter, lancets and strips sent in for patient.  Urine albumin completed in office and referral also placed for eye exam to ophthalmology.  Discussed needed dietary changes and regular exercise for management.  Return in 6  months for follow-up. - Ambulatory referral to Ophthalmology - POCT UA - Microalbumin    Orders Placed This Encounter  Procedures   Ambulatory referral to Gastroenterology    Referral Priority:   Routine    Referral Type:   Consultation    Referral Reason:   Specialty Services Required    Number of Visits Requested:   1   Ambulatory referral to Orthopedic Surgery    Referral Priority:   Routine    Referral Type:   Surgical    Referral Reason:   Specialty Services Required    Referred to Provider:   Genelle Standing, MD    Requested Specialty:   Orthopedic Surgery    Number of Visits Requested:   1   Ambulatory referral to Ophthalmology    Referral Priority:   Routine    Referral Type:   Consultation    Referral Reason:   Specialty Services Required    Referred to Provider:   Geraldene Loving, MD    Number of Visits Requested:   1   POCT UA - Microalbumin    PATIENT COUNSELING: - Encouraged to adjust caloric intake to maintain or achieve ideal body weight, to reduce intake of dietary saturated fat and total fat, to limit sodium intake by avoiding high sodium foods and not adding table salt, and to maintain adequate dietary potassium and calcium  preferably from fresh fruits, vegetables, and low-fat dairy products.   - Advised to avoid cigarette smoking. - Discussed with the patient that most people either abstain from alcohol or drink within safe limits (<=14/week and <=4 drinks/occasion for males, <=7/weeks and <= 3 drinks/occasion for females) and that the risk for alcohol disorders and other health effects rises proportionally with the number of drinks per week and how often a drinker exceeds daily limits. - Discussed  cessation/primary prevention of drug use and availability of treatment for abuse.   - Stressed the importance of regular exercise - Injury prevention: Discussed safety belts, safety helmets, smoke detector, smoking near bedding or upholstery.  - Dental health:  Discussed importance of regular tooth brushing, flossing, and dental visits.  - Sexuality: Discussed sexually transmitted diseases, partner selection, use of condoms, avoidance of unintended pregnancy  and contraceptive alternatives.   NEXT PREVENTATIVE PHYSICAL DUE IN 1 YEAR.  Return for 6 week BP Check only and 6 Month Follow up Office visit for HTN, DM2.  Patient to reach out to office if new, worrisome, or unresolved symptoms arise or if no improvement in patient's condition. Patient verbalized understanding and is agreeable to treatment plan. All questions answered to patient's satisfaction.    Thersia Schuyler Stark, [place]

## 2023-05-10 ENCOUNTER — Ambulatory Visit: Payer: Commercial Managed Care - HMO | Admitting: Orthopaedic Surgery

## 2023-05-17 ENCOUNTER — Ambulatory Visit: Payer: Commercial Managed Care - HMO | Admitting: Orthopaedic Surgery

## 2023-06-09 ENCOUNTER — Encounter (HOSPITAL_BASED_OUTPATIENT_CLINIC_OR_DEPARTMENT_OTHER): Payer: Self-pay | Admitting: Student

## 2023-06-09 ENCOUNTER — Ambulatory Visit (HOSPITAL_BASED_OUTPATIENT_CLINIC_OR_DEPARTMENT_OTHER): Payer: Commercial Managed Care - HMO

## 2023-06-09 ENCOUNTER — Ambulatory Visit (HOSPITAL_BASED_OUTPATIENT_CLINIC_OR_DEPARTMENT_OTHER): Payer: Commercial Managed Care - HMO | Admitting: Student

## 2023-06-09 ENCOUNTER — Ambulatory Visit (HOSPITAL_BASED_OUTPATIENT_CLINIC_OR_DEPARTMENT_OTHER): Payer: Commercial Managed Care - HMO | Admitting: Family Medicine

## 2023-06-09 VITALS — BP 161/94

## 2023-06-09 DIAGNOSIS — G8929 Other chronic pain: Secondary | ICD-10-CM | POA: Diagnosis not present

## 2023-06-09 DIAGNOSIS — I10 Essential (primary) hypertension: Secondary | ICD-10-CM

## 2023-06-09 DIAGNOSIS — M5441 Lumbago with sciatica, right side: Secondary | ICD-10-CM

## 2023-06-09 DIAGNOSIS — M5126 Other intervertebral disc displacement, lumbar region: Secondary | ICD-10-CM

## 2023-06-09 DIAGNOSIS — M792 Neuralgia and neuritis, unspecified: Secondary | ICD-10-CM

## 2023-06-09 MED ORDER — LOSARTAN POTASSIUM 50 MG PO TABS: 50.0000 mg | ORAL_TABLET | Freq: Every day | ORAL | 3 refills | Status: DC

## 2023-06-09 MED ORDER — METHYLPREDNISOLONE 4 MG PO TBPK: ORAL_TABLET | ORAL | 0 refills | Status: DC

## 2023-06-09 MED ORDER — PREGABALIN 75 MG PO CAPS: 75.0000 mg | ORAL_CAPSULE | Freq: Two times a day (BID) | ORAL | 3 refills | Status: DC | PRN

## 2023-06-09 NOTE — Progress Notes (Signed)
Discussed patient's elevated blood pressure over the phone with him.  He has been taking losartan 25 mg 1 tablet and amlodipine 10 mg 1 tablet daily.  We will increase his losartan to 50 mg daily and return in 4 weeks for blood pressure check on July 07, 2023.  Recommend patient continue to monitor blood pressure at home and he verbalized understanding.  While on the phone patient states that his gabapentin for diabetic neuropathy is not improving pain and would like to try different medication.  We discussed trialing Lyrica 75 mg once daily and increasing to twice a day as needed for pain.  Discussed safe use of this medication and he verbalized understanding.  Prescription sent in.  Follow-up as scheduled.

## 2023-06-09 NOTE — Patient Instructions (Signed)
Patient is in office today for a nurse visit for Blood Pressure Check. Blood Pressure was high both times it was checked and noted in epic. Advised the provider . Patient was not having any symptoms.  Patient did express back pain, and I suggested that the patient go to the [facility] Orth[facility] clinic on the second floor
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
## 2023-06-09 NOTE — Progress Notes (Unsigned)
Patient is in office today for a nurse visit for Blood Pressure Check. Patient blood pressure was 155/94 , Patient No chest pain, No shortness of breath, No dyspnea on exertion, No orthopnea, No paroxysmal nocturnal dyspnea, No edema, No palpitations, No syncope   Recheck bp 161/94. Jerre Simon NP notified

## 2023-06-23 ENCOUNTER — Encounter: Payer: Self-pay | Admitting: Physical Medicine and Rehabilitation

## 2023-06-23 ENCOUNTER — Ambulatory Visit (INDEPENDENT_AMBULATORY_CARE_PROVIDER_SITE_OTHER): Payer: Commercial Managed Care - HMO | Admitting: Physical Medicine and Rehabilitation

## 2023-06-23 VITALS — BP 182/100 | HR 64

## 2023-06-23 DIAGNOSIS — M48061 Spinal stenosis, lumbar region without neurogenic claudication: Secondary | ICD-10-CM | POA: Diagnosis not present

## 2023-06-23 DIAGNOSIS — M5416 Radiculopathy, lumbar region: Secondary | ICD-10-CM

## 2023-06-23 DIAGNOSIS — M5441 Lumbago with sciatica, right side: Secondary | ICD-10-CM

## 2023-06-23 DIAGNOSIS — M48062 Spinal stenosis, lumbar region with neurogenic claudication: Secondary | ICD-10-CM | POA: Diagnosis not present

## 2023-06-23 DIAGNOSIS — M5116 Intervertebral disc disorders with radiculopathy, lumbar region: Secondary | ICD-10-CM | POA: Diagnosis not present

## 2023-06-23 DIAGNOSIS — M5442 Lumbago with sciatica, left side: Secondary | ICD-10-CM | POA: Diagnosis not present

## 2023-06-23 DIAGNOSIS — M47816 Spondylosis without myelopathy or radiculopathy, lumbar region: Secondary | ICD-10-CM

## 2023-06-23 DIAGNOSIS — G8929 Other chronic pain: Secondary | ICD-10-CM

## 2023-06-23 MED ORDER — METHOCARBAMOL 500 MG PO TABS: 500.0000 mg | ORAL_TABLET | Freq: Three times a day (TID) | ORAL | 0 refills | Status: DC
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
## 2023-06-23 NOTE — Progress Notes (Signed)
 Pain Score---9

## 2023-06-27 ENCOUNTER — Encounter: Payer: Self-pay | Admitting: Physical Medicine and Rehabilitation

## 2023-07-07 ENCOUNTER — Ambulatory Visit (INDEPENDENT_AMBULATORY_CARE_PROVIDER_SITE_OTHER): Payer: Commercial Managed Care - HMO | Admitting: *Deleted

## 2023-07-07 VITALS — BP 185/99

## 2023-07-07 DIAGNOSIS — I10 Essential (primary) hypertension: Secondary | ICD-10-CM

## 2023-07-07 MED ORDER — AMLODIPINE BESYLATE 10 MG PO TABS: 10.0000 mg | ORAL_TABLET | Freq: Every day | ORAL | 3 refills | Status: DC

## 2023-07-07 MED ORDER — LOSARTAN POTASSIUM 50 MG PO TABS
50.0000 mg | ORAL_TABLET | Freq: Every day | ORAL | 3 refills | Status: DC
Start: 2023-07-07 — End: 2023-09-27

## 2023-07-07 NOTE — Progress Notes (Signed)
 Patient is in office today for a nurse visit for Blood Pressure Check. Patient BP was checked twice and both readings have been documented. Patient stated that he had not taken his meds today.

## 2023-07-07 NOTE — Addendum Note (Signed)
 Addended by: Wyvonne Lenz on: 07/07/2023 01:16 PM   Modules accepted: Orders

## 2023-07-08 ENCOUNTER — Encounter: Payer: Self-pay | Admitting: Physical Medicine and Rehabilitation

## 2023-07-08 ENCOUNTER — Ambulatory Visit: Admitting: Physical Medicine and Rehabilitation

## 2023-07-08 ENCOUNTER — Other Ambulatory Visit: Payer: Commercial Managed Care - HMO

## 2023-07-08 DIAGNOSIS — M48062 Spinal stenosis, lumbar region with neurogenic claudication: Secondary | ICD-10-CM

## 2023-07-08 DIAGNOSIS — M5416 Radiculopathy, lumbar region: Secondary | ICD-10-CM

## 2023-07-08 DIAGNOSIS — M5442 Lumbago with sciatica, left side: Secondary | ICD-10-CM

## 2023-07-08 DIAGNOSIS — M47816 Spondylosis without myelopathy or radiculopathy, lumbar region: Secondary | ICD-10-CM | POA: Diagnosis not present

## 2023-07-08 DIAGNOSIS — M5441 Lumbago with sciatica, right side: Secondary | ICD-10-CM

## 2023-07-08 DIAGNOSIS — R269 Unspecified abnormalities of gait and mobility: Secondary | ICD-10-CM

## 2023-07-08 DIAGNOSIS — G8929 Other chronic pain: Secondary | ICD-10-CM

## 2023-07-08 MED ORDER — MELOXICAM 15 MG PO TABS: 15.0000 mg | ORAL_TABLET | Freq: Every day | ORAL | 0 refills | Status: DC

## 2023-07-08 MED ORDER — METHOCARBAMOL 500 MG PO TABS: 500.0000 mg | ORAL_TABLET | Freq: Three times a day (TID) | ORAL | 0 refills | Status: DC
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
## 2023-07-08 NOTE — Progress Notes (Signed)
 Pain Scale   Average Pain 9        +Driver, -BT, -Dye Allergies.

## 2023-07-14 ENCOUNTER — Ambulatory Visit (HOSPITAL_BASED_OUTPATIENT_CLINIC_OR_DEPARTMENT_OTHER)

## 2023-09-25 ENCOUNTER — Encounter (HOSPITAL_COMMUNITY): Payer: Self-pay

## 2023-09-25 ENCOUNTER — Other Ambulatory Visit: Payer: Self-pay

## 2023-09-25 ENCOUNTER — Ambulatory Visit (HOSPITAL_COMMUNITY)
Admission: EM | Admit: 2023-09-25 | Discharge: 2023-09-25 | Disposition: A | Discharge status: Other Acute Inpatient Hospital | Payer: Self-pay | Attending: Physician Assistant | Admitting: Physician Assistant

## 2023-09-25 ENCOUNTER — Emergency Department (HOSPITAL_COMMUNITY): Payer: MEDICAID

## 2023-09-25 ENCOUNTER — Inpatient Hospital Stay (HOSPITAL_COMMUNITY)
Admission: EM | Admit: 2023-09-25 | Discharge: 2023-09-27 | DRG: 322 | Disposition: A | Discharge status: Home / Self Care | Payer: MEDICAID | Attending: Cardiology | Admitting: Cardiology

## 2023-09-25 DIAGNOSIS — I2510 Atherosclerotic heart disease of native coronary artery without angina pectoris: Secondary | ICD-10-CM | POA: Diagnosis present

## 2023-09-25 DIAGNOSIS — Z5971 Insufficient health insurance coverage: Secondary | ICD-10-CM

## 2023-09-25 DIAGNOSIS — Z79899 Other long term (current) drug therapy: Secondary | ICD-10-CM | POA: Diagnosis not present

## 2023-09-25 DIAGNOSIS — Z794 Long term (current) use of insulin: Secondary | ICD-10-CM | POA: Diagnosis not present

## 2023-09-25 DIAGNOSIS — I214 Non-ST elevation (NSTEMI) myocardial infarction: Principal | ICD-10-CM | POA: Diagnosis present

## 2023-09-25 DIAGNOSIS — Z791 Long term (current) use of non-steroidal anti-inflammatories (NSAID): Secondary | ICD-10-CM

## 2023-09-25 DIAGNOSIS — E785 Hyperlipidemia, unspecified: Secondary | ICD-10-CM | POA: Diagnosis present

## 2023-09-25 DIAGNOSIS — M48061 Spinal stenosis, lumbar region without neurogenic claudication: Secondary | ICD-10-CM | POA: Diagnosis present

## 2023-09-25 DIAGNOSIS — Z8249 Family history of ischemic heart disease and other diseases of the circulatory system: Secondary | ICD-10-CM | POA: Diagnosis not present

## 2023-09-25 DIAGNOSIS — Z955 Presence of coronary angioplasty implant and graft: Secondary | ICD-10-CM

## 2023-09-25 DIAGNOSIS — K219 Gastro-esophageal reflux disease without esophagitis: Secondary | ICD-10-CM | POA: Diagnosis present

## 2023-09-25 DIAGNOSIS — Z7984 Long term (current) use of oral hypoglycemic drugs: Secondary | ICD-10-CM | POA: Diagnosis not present

## 2023-09-25 DIAGNOSIS — R079 Chest pain, unspecified: Secondary | ICD-10-CM

## 2023-09-25 DIAGNOSIS — I160 Hypertensive urgency: Secondary | ICD-10-CM

## 2023-09-25 DIAGNOSIS — Z7982 Long term (current) use of aspirin: Secondary | ICD-10-CM | POA: Diagnosis not present

## 2023-09-25 DIAGNOSIS — E1165 Type 2 diabetes mellitus with hyperglycemia: Secondary | ICD-10-CM | POA: Diagnosis present

## 2023-09-25 DIAGNOSIS — Z823 Family history of stroke: Secondary | ICD-10-CM | POA: Diagnosis not present

## 2023-09-25 DIAGNOSIS — Z5986 Financial insecurity: Secondary | ICD-10-CM | POA: Diagnosis not present

## 2023-09-25 DIAGNOSIS — I10 Essential (primary) hypertension: Secondary | ICD-10-CM | POA: Diagnosis present

## 2023-09-25 DIAGNOSIS — I161 Hypertensive emergency: Secondary | ICD-10-CM | POA: Diagnosis present

## 2023-09-25 LAB — CBC WITH DIFFERENTIAL/PLATELET
Abs Immature Granulocytes: 0.03 10*3/uL (ref 0.00–0.07)
Basophils Absolute: 0.1 10*3/uL (ref 0.0–0.1)
Basophils Relative: 1 %
Eosinophils Absolute: 0.1 10*3/uL (ref 0.0–0.5)
Eosinophils Relative: 1 %
HCT: 41.4 % (ref 39.0–52.0)
Hemoglobin: 13.8 g/dL (ref 13.0–17.0)
Immature Granulocytes: 0 %
Lymphocytes Relative: 24 %
Lymphs Abs: 2.7 10*3/uL (ref 0.7–4.0)
MCH: 30.4 pg (ref 26.0–34.0)
MCHC: 33.3 g/dL (ref 30.0–36.0)
MCV: 91.2 fL (ref 80.0–100.0)
Monocytes Absolute: 0.8 10*3/uL (ref 0.1–1.0)
Monocytes Relative: 7 %
Neutro Abs: 7.6 10*3/uL (ref 1.7–7.7)
Neutrophils Relative %: 67 %
Platelets: 273 10*3/uL (ref 150–400)
RBC: 4.54 MIL/uL (ref 4.22–5.81)
RDW: 12.9 % (ref 11.5–15.5)
WBC: 11.3 10*3/uL — ABNORMAL HIGH (ref 4.0–10.5)
nRBC: 0 % (ref 0.0–0.2)

## 2023-09-25 LAB — RAPID URINE DRUG SCREEN, HOSP PERFORMED
Amphetamines: NOT DETECTED
Barbiturates: NOT DETECTED
Benzodiazepines: NOT DETECTED
Cocaine: NOT DETECTED
Opiates: NOT DETECTED
Tetrahydrocannabinol: NOT DETECTED

## 2023-09-25 LAB — COMPREHENSIVE METABOLIC PANEL WITH GFR
ALT: 13 U/L (ref 0–44)
AST: 36 U/L (ref 15–41)
Albumin: 3.4 g/dL — ABNORMAL LOW (ref 3.5–5.0)
Alkaline Phosphatase: 89 U/L (ref 38–126)
Anion gap: 8 (ref 5–15)
BUN: 8 mg/dL (ref 6–20)
CO2: 23 mmol/L (ref 22–32)
Calcium: 8.1 mg/dL — ABNORMAL LOW (ref 8.9–10.3)
Chloride: 105 mmol/L (ref 98–111)
Creatinine, Ser: 1.05 mg/dL (ref 0.61–1.24)
GFR, Estimated: >60 mL/min (ref >60)
Glucose, Bld: 211 mg/dL — ABNORMAL HIGH (ref 70–99)
Potassium: 4.5 mmol/L (ref 3.5–5.1)
Sodium: 136 mmol/L (ref 135–145)
Total Bilirubin: 1.9 mg/dL — ABNORMAL HIGH (ref 0.0–1.2)
Total Protein: 7 g/dL (ref 6.5–8.1)

## 2023-09-25 LAB — CBG MONITORING, ED: Glucose-Capillary: 254 mg/dL — ABNORMAL HIGH (ref 70–99)

## 2023-09-25 LAB — TROPONIN I (HIGH SENSITIVITY)
Troponin I (High Sensitivity): 1081 ng/L (ref <18)
Troponin I (High Sensitivity): 3869 ng/L (ref <18)
Troponin I (High Sensitivity): 10007 ng/L (ref <18)

## 2023-09-25 MED ORDER — AMLODIPINE BESYLATE 10 MG PO TABS
10.0000 mg | ORAL_TABLET | Freq: Every day | ORAL | Status: DC
  Administered 2023-09-26: 10 mg via ORAL
  Filled 2023-09-25: qty 2

## 2023-09-25 MED ORDER — PREGABALIN 75 MG PO CAPS: 75.0000 mg | ORAL_CAPSULE | Freq: Two times a day (BID) | ORAL | Status: DC | PRN

## 2023-09-25 MED ORDER — ASPIRIN 81 MG PO CHEW
324.0000 mg | CHEWABLE_TABLET | Freq: Once | ORAL | Status: AC
  Administered 2023-09-25: 324 mg via ORAL

## 2023-09-25 MED ORDER — ACETAMINOPHEN 325 MG PO TABS: 650.0000 mg | ORAL_TABLET | ORAL | Status: DC | PRN

## 2023-09-25 MED ORDER — ASPIRIN 81 MG PO CHEW
324.0000 mg | CHEWABLE_TABLET | ORAL | Status: AC
  Administered 2023-09-25: 324 mg via ORAL
  Filled 2023-09-25: qty 4

## 2023-09-25 MED ORDER — ATORVASTATIN CALCIUM 80 MG PO TABS
80.0000 mg | ORAL_TABLET | Freq: Every day | ORAL | Status: DC
Start: 2023-09-26 — End: 2023-09-27
  Administered 2023-09-26 – 2023-09-27 (×2): 80 mg via ORAL
  Filled 2023-09-25 (×2): qty 1

## 2023-09-25 MED ORDER — ASPIRIN 81 MG PO TBEC
81.0000 mg | DELAYED_RELEASE_TABLET | Freq: Every day | ORAL | Status: DC
  Administered 2023-09-26 – 2023-09-27 (×2): 81 mg via ORAL
  Filled 2023-09-25 (×2): qty 1

## 2023-09-25 MED ORDER — PANTOPRAZOLE SODIUM 20 MG PO TBEC
20.0000 mg | DELAYED_RELEASE_TABLET | Freq: Every day | ORAL | Status: DC
  Administered 2023-09-26 – 2023-09-27 (×2): 20 mg via ORAL
  Filled 2023-09-25 (×2): qty 1

## 2023-09-25 MED ORDER — HEPARIN (PORCINE) 25000 UT/250ML-% IV SOLN
850.0000 [IU]/h | INTRAVENOUS | Status: DC
  Administered 2023-09-25: 850 [IU]/h via INTRAVENOUS
  Filled 2023-09-25: qty 250

## 2023-09-25 MED ORDER — METHOCARBAMOL 500 MG PO TABS
500.0000 mg | ORAL_TABLET | Freq: Three times a day (TID) | ORAL | Status: DC
  Administered 2023-09-25 – 2023-09-27 (×4): 500 mg via ORAL
  Filled 2023-09-25 (×4): qty 1

## 2023-09-25 MED ORDER — INSULIN ASPART 100 UNIT/ML IJ SOLN
0.0000 [IU] | Freq: Three times a day (TID) | INTRAMUSCULAR | Status: DC
  Administered 2023-09-26: 5 [IU] via SUBCUTANEOUS
  Administered 2023-09-26: 2 [IU] via SUBCUTANEOUS
  Administered 2023-09-27: 3 [IU] via SUBCUTANEOUS
  Administered 2023-09-27: 2 [IU] via SUBCUTANEOUS

## 2023-09-25 MED ORDER — INSULIN ASPART 100 UNIT/ML IJ SOLN
0.0000 [IU] | Freq: Every day | INTRAMUSCULAR | Status: DC
  Administered 2023-09-25: 3 [IU] via SUBCUTANEOUS

## 2023-09-25 MED ORDER — NITROGLYCERIN IN D5W 200-5 MCG/ML-% IV SOLN
2.0000 ug/min | INTRAVENOUS | Status: DC
  Administered 2023-09-25: 5 ug/min via INTRAVENOUS
  Filled 2023-09-25: qty 250

## 2023-09-25 MED ORDER — ASPIRIN 81 MG PO CHEW
CHEWABLE_TABLET | ORAL | Status: AC
  Filled 2023-09-25: qty 4

## 2023-09-25 MED ORDER — ONDANSETRON HCL 4 MG/2ML IJ SOLN
4.0000 mg | Freq: Four times a day (QID) | INTRAMUSCULAR | Status: DC | PRN
  Administered 2023-09-26: 4 mg via INTRAVENOUS

## 2023-09-25 MED ORDER — NITROGLYCERIN 0.4 MG SL SUBL: 0.4000 mg | SUBLINGUAL_TABLET | SUBLINGUAL | Status: DC | PRN

## 2023-09-25 MED ORDER — ASPIRIN 300 MG RE SUPP: 300.0000 mg | RECTAL | Status: AC

## 2023-09-25 MED ORDER — LOSARTAN POTASSIUM 50 MG PO TABS: 50.0000 mg | ORAL_TABLET | Freq: Every day | ORAL | Status: DC

## 2023-09-25 MED ORDER — CARVEDILOL 3.125 MG PO TABS
3.1250 mg | ORAL_TABLET | Freq: Two times a day (BID) | ORAL | Status: DC
  Administered 2023-09-26 (×2): 3.125 mg via ORAL
  Filled 2023-09-25 (×2): qty 1

## 2023-09-25 MED ORDER — HEPARIN BOLUS VIA INFUSION
4000.0000 [IU] | Freq: Once | INTRAVENOUS | Status: AC
  Administered 2023-09-25: 4000 [IU] via INTRAVENOUS
  Filled 2023-09-25: qty 4000

## 2023-09-25 MED ORDER — CARVEDILOL 3.125 MG PO TABS
3.1250 mg | ORAL_TABLET | Freq: Two times a day (BID) | ORAL | Status: DC
Start: 2023-09-26 — End: 2023-09-25

## 2023-09-25 NOTE — ED Notes (Signed)
 Carolyn Cisco RN called report regarding patient arrival.

## 2023-09-25 NOTE — Discharge Instructions (Addendum)
 You were transported to the ER for further evaluation.

## 2023-09-25 NOTE — ED Triage Notes (Signed)
 Pt sent from Urgent Care due to blood pressures in the 200s systolic. Has not had his blood pressure medications in 3 weeks. Chest pain began yesterday. Denies NV and dizziness.

## 2023-09-25 NOTE — ED Triage Notes (Addendum)
 Patient reports that he began having chest pain since around 1600 yesterday. Patient states he went to sleep and when he woke this AM he still had chest pain and pressure.  Patient also c/o SOB and states he feels like he is having a toothache in his lower jaw.  Patient states he has been out of all of his meds including BP me and diabetes medications x 3 weeks.

## 2023-09-25 NOTE — ED Notes (Signed)
Patient refused to have IV started.

## 2023-09-25 NOTE — H&P (Signed)
 Cardiology Admission History and Physical   Patient ID: Peter Bell MRN: [id number]; DOB: March 31, 1971   Admission date: 09/25/2023  PCP:  Nonda Bays, FNP   [facility] Providers Cardiologist:  None       Chief Complaint: Chest pain  Patient Profile: Peter Bell is a 53 y.o. male with pretension, diabetes type 2 who is being seen 09/25/2023 for the evaluation of chest pain and hypertension.  Cardiology was consulted by ER physician for elevated troponin.  History of Present Illness: Peter Bell is a 53 y.o. male with pretension, diabetes type 2 who is being seen 09/25/2023 for the evaluation of chest pain and hypertension.  Patient reports chest pain that started since yesterday, but got worse today morning/afternoon with chest pain/pressure-like sensation associated with shortness of breath and jaw ache, thus came to the hospital . He got aspirin  and NTG with improvement  in his BP and Chest pain. Currently CP free.  Patient has a history of hypertension and is on losartan  and amlodipine , has been out of medication since last 3 weeks.  Had recent death in family (sister died) and has been stressed and sad. Reports his BP is always high, never low. Also has diabetes with A1c 7.5.  Never, smoked but alcohol use disorder- quit several months ago.  Denies any drug use. Family history: a lot with HTN but no premature CAD or SCD Has spinal stenosis- so functional status is poor.   ER his blood pressure was initially in 290 186/105 mmHg, still hypertensive 193/134 mmHg Blood work shows first troponin is 1081. Creatinine 1.0, glucose 211 EKG shows sinus rhythm with ST depressions in anterolateral leads, LVH (compared to prior EKG 03/2023 LVH with repolarization abnormalities, sinus rhythm) Chest x-ray shows mild cardiomegaly   Past Medical History:  Diagnosis Date   Asthma    Diabetes mellitus without complication (HCC)    Eczema    GERD  (gastroesophageal reflux disease)    Hypertension    Sciatica    Past Surgical History:  Procedure Laterality Date   HERNIA REPAIR       Medications Prior to Admission: Prior to Admission medications   Medication Sig Start Date End Date Taking? Authorizing Provider  amLODipine  (NORVASC ) 10 MG tablet Take 1 tablet (10 mg total) by mouth daily. 07/07/23   Caudle, Arcola Kocher, FNP  Blood Glucose Monitoring Suppl DEVI 1 each by Does not apply route in the morning, at noon, and at bedtime. May substitute to any manufacturer covered by patient's insurance. 04/28/23   Caudle, Arcola Kocher, FNP  glipiZIDE  (GLUCOTROL  XL) 5 MG 24 hr tablet Take 1 tablet (5 mg total) by mouth daily with breakfast. 04/28/23   Caudle, Arcola Kocher, FNP  Glucose Blood (BLOOD GLUCOSE TEST STRIPS) STRP 1 each by In Vitro route in the morning, at noon, and at bedtime. May substitute to any manufacturer covered by patient's insurance. 04/28/23   Caudle, Arcola Kocher, FNP  losartan  (COZAAR ) 50 MG tablet Take 1 tablet (50 mg total) by mouth daily. 07/07/23   Caudle, Arcola Kocher, FNP  meloxicam  (MOBIC ) 15 MG tablet Take 1 tablet (15 mg total) by mouth daily. 07/08/23 07/07/24  Williams, Megan E, NP  methocarbamol  (ROBAXIN ) 500 MG tablet Take 1 tablet (500 mg total) by mouth 3 (three) times daily. 07/08/23   Williams, Megan E, NP  methylPREDNISolone  (MEDROL  DOSEPAK) 4 MG TBPK tablet Take per packet instructions 06/09/23   Overturf, Jackson L, PA-C  pantoprazole  (PROTONIX ) 20  MG tablet Take 1 tablet (20 mg total) by mouth daily. 03/28/23   LampteyDonley Furth, MD  pregabalin  (LYRICA ) 75 MG capsule Take 1 capsule (75 mg total) by mouth 2 (two) times daily as needed (neuropathic pain). 06/09/23   Caudle, Arcola Kocher, FNP  insulin glargine (LANTUS) 100 UNIT/ML injection Inject 16 Units into the skin daily.  04/14/19  [provider]  metFORMIN (GLUCOPHAGE) 500 MG tablet Take 500 mg by mouth.  04/14/19  [provider]   pravastatin (PRAVACHOL) 40 MG tablet Take 40 mg by mouth daily.  04/14/19  [provider]     Allergies:   No Known Allergies  Social History:   Social History   Socioeconomic History   Marital status: Divorced    Spouse name: Not on file   Number of children: Not on file   Years of education: Not on file   Highest education level: Not on file  Occupational History   Not on file  Tobacco Use   Smoking status: Never   Smokeless tobacco: Never  Vaping Use   Vaping status: Never Used  Substance and Sexual Activity   Alcohol use: Not Currently   Drug use: No   Sexual activity: Not on file  Other Topics Concern   Not on file  Social History Narrative   Not on file   Social Drivers of Health   Financial Resource Strain: High Risk (04/28/2023)   Overall Financial Resource Strain (CARDIA)    Difficulty of Paying Living Expenses: Very hard  Food Insecurity: No Food Insecurity (04/28/2023)   Hunger Vital Sign    Worried About Running Out of Food in the Last Year: Never true    Ran Out of Food in the Last Year: Never true  Transportation Needs: No Transportation Needs (04/28/2023)   PRAPARE - Administrator, Civil Service (Medical): No    Lack of Transportation (Non-Medical): No  Physical Activity: Inactive (04/28/2023)   Exercise Vital Sign    Days of Exercise per Week: 0 days    Minutes of Exercise per Session: 0 min  Stress: No Stress Concern Present (04/28/2023)   [employer] of Occupational Health - Occupational Stress Questionnaire    Feeling of Stress : Only a little  Social Connections: Socially Isolated (04/28/2023)   Social Connection and Isolation Panel [NHANES]    Frequency of Communication with Friends and Family: More than three times a week    Frequency of Social Gatherings with Friends and Family: Never    Attends Religious Services: Never    Database administrator or Organizations: No    Attends Banker Meetings: Never     Marital Status: Divorced  Catering manager Violence: Not At Risk (04/28/2023)   Humiliation, Afraid, Rape, and Kick questionnaire    Fear of Current or Ex-Partner: No    Emotionally Abused: No    Physically Abused: No    Sexually Abused: No     Family History:   The patient's family history includes CVA in his brother and brother; Cancer in his father and mother; Heart murmur in his sister; Hypertension in his brother and sister.    ROS:  Please see the history of present illness.  All other ROS reviewed and negative.     Physical Exam/Data: Vitals:   09/25/23 1745 09/25/23 1800 09/25/23 1815 09/25/23 1830  BP: (!) 179/93 (!) 183/99 (!) 204/122 (!) 193/134  Pulse: (!) 57 61 74 72  Resp: 16 16  18 17  Temp:      TempSrc:      SpO2: 99% 100% 100% 100%  Weight:      Height:       No intake or output data in the 24 hours ending 09/25/23 1838    09/25/2023    4:09 PM 04/28/2023    8:31 AM 03/31/2023    9:01 AM  Last 3 Weights  Weight (lbs) 145 lb 143 lb 3.2 oz 137 lb 4.8 oz  Weight (kg) 65.772 kg 64.955 kg 62.279 kg     Body mass index is 23.4 kg/m.  General:  Well nourished, well developed, in no acute distress HEENT: normal Neck: no JVD Vascular: No carotid bruits; Distal pulses 2+ bilaterally   Cardiac:  normal S1, S2; RRR; no murmur  Lungs:  clear to auscultation bilaterally, no wheezing, rhonchi or rales  Abd: soft, nontender, no hepatomegaly  Ext: no edema Musculoskeletal:  No deformities, BUE and BLE strength normal and equal Skin: warm and dry  Neuro:  CNs 2-12 intact, no focal abnormalities noted Psych:  Normal affect   EKG: As noted above  Relevant CV Studies: As noted above  Laboratory Data: High Sensitivity Troponin:   Recent Labs  Lab 09/25/23 1636  TROPONINIHS 1,081*      Chemistry Recent Labs  Lab 09/25/23 1636  NA 136  K 4.5  CL 105  CO2 23  GLUCOSE 211*  BUN 8  CREATININE 1.05  CALCIUM 8.1*  GFRNONAA >60  ANIONGAP 8    Recent Labs   Lab 09/25/23 1636  PROT 7.0  ALBUMIN 3.4*  AST 36  ALT 13  ALKPHOS 89  BILITOT 1.9*   Lipids No results for input(s): "CHOL", "TRIG", "HDL", "LABVLDL", "LDLCALC", "CHOLHDL" in the last 168 hours. Hematology Recent Labs  Lab 09/25/23 1636  WBC 11.3*  RBC 4.54  HGB 13.8  HCT 41.4  MCV 91.2  MCH 30.4  MCHC 33.3  RDW 12.9  PLT 273   Thyroid No results for input(s): "TSH", "FREET4" in the last 168 hours. BNPNo results for input(s): "BNP", "PROBNP" in the last 168 hours.  DDimer No results for input(s): "DDIMER" in the last 168 hours.  Radiology/Studies:  DG Chest Portable 1 View Result Date: 09/25/2023 CLINICAL DATA:  chest pain and hypertension EXAM: PORTABLE CHEST - 1 VIEW COMPARISON:  Sep 05, 2020 FINDINGS: No focal airspace consolidation, pleural effusion, or pneumothorax. No cardiomegaly. No acute fracture or destructive lesion. Multilevel thoracic osteophytosis. IMPRESSION: No acute cardiopulmonary abnormality. Electronically Signed   By: Rance Burrows M.D.   On: 09/25/2023 17:12     Assessment and Plan: Hypertensive emergency, poorly controlled hypertension. Chest pain/NSTEMI troponin 1081 Diabetes type 2 Former alcohol use disorder  Plan: -> Admit under cardiology service, status post aspirin , nitroglycerin with improvement in pain, still hypertensive.  We will restart his home antihypertensive medication pressure continues to be uncontrolled, 220 over 120 mmHg, will start him on nitroglycerin drip to control blood pressure and bring it down to like 170 range.   --Currently chest pain-free, continue to monitor EKG and ccontinue heparin gtt. --> Echocardiogram in a.m., lipid profile, A1c, lab check in AM. Keep him n.p.o. after midnight for left heart catheterization in the a.m. Trend troponin, EKG.  Continue aspirin , statin therapy and home antihypertensive, insulin sliding scale - Aggressive risk factor modification need to get good good blood pressure  control. Check urine toxicology   Full code  Risk Assessment/Risk Scores:   TIMI Risk Score  for Unstable Angina or Non-ST Elevation MI:   The patient's TIMI risk score is 4, which indicates a 20% risk of all cause mortality, new or recurrent myocardial infarction or need for urgent revascularization in the next 14 days.     Code Status: Full Code  Severity of Illness: The appropriate patient status for this patient is INPATIENT. Inpatient status is judged to be reasonable and necessary in order to provide the required intensity of service to ensure the patient's safety. The patient's presenting symptoms, physical exam findings, and initial radiographic and laboratory data in the context of their chronic comorbidities is felt to place them at high risk for further clinical deterioration. Furthermore, it is not anticipated that the patient will be medically stable for discharge from the hospital within 2 midnights of admission.   * I certify that at the point of admission it is my clinical judgment that the patient will require inpatient hospital care spanning beyond 2 midnights from the point of admission due to high intensity of service, high risk for further deterioration and high frequency of surveillance required.*  For questions or updates, please contact [facility] Please consult [email or web address] for contact info under     Signed, Cranston Dk, MD  09/25/2023 6:38 PM

## 2023-09-25 NOTE — ED Provider Notes (Signed)
 [place] EMERGENCY DEPARTMENT AT [facility] Provider Note  CSN: [id number] Arrival date & time: 09/25/23 1607  Chief Complaint(s) Chest Pain and Hypertension  HPI Peter Bell is a 53 y.o. male history of diabetes, hypertension presenting to the emergency department chest pain.  Patient reports chest pain starting yesterday, worsened today.  Reports associated shortness of breath, lightheadedness.  No pleuritic pain.  Did seem worse with exertion.  No nausea or vomiting.  No diaphoresis.  No abdominal pain or back pain.  Pain does not radiate.  No recent travel, surgery.  No history of DVT or PE.  Reports that he was given aspirin  and some other medication by the paramedics and his pain is much better now.  Initially went to urgent care.   Past Medical History Past Medical History:  Diagnosis Date   Asthma    Diabetes mellitus without complication (HCC)    Eczema    GERD (gastroesophageal reflux disease)    Hypertension    Sciatica    Patient Active Problem List   Diagnosis Date Noted   NSTEMI (non-ST elevated myocardial infarction) (HCC) 09/25/2023   Impaired gait 03/31/2023   Type 2 diabetes mellitus with hyperglycemia, without long-term current use of insulin (HCC) 03/31/2023   Neuropathic pain 03/31/2023   Spinal stenosis of lumbar region 04/22/2020   Acute pain of left knee 09/17/2019   Protrusion of lumbar intervertebral disc 05/29/2019   SOB (shortness of breath) 05/10/2013   Essential hypertension, benign 04/24/2013   Chest pain, unspecified 04/24/2013   Home Medication(s) Prior to Admission medications   Medication Sig Start Date End Date Taking? Authorizing Provider  amLODipine  (NORVASC ) 10 MG tablet Take 1 tablet (10 mg total) by mouth daily. 07/07/23   Caudle, Arcola Kocher, FNP  Blood Glucose Monitoring Suppl DEVI 1 each by Does not apply route in the morning, at noon, and at bedtime. May substitute to any manufacturer covered by patient's  insurance. 04/28/23   Caudle, Arcola Kocher, FNP  glipiZIDE  (GLUCOTROL  XL) 5 MG 24 hr tablet Take 1 tablet (5 mg total) by mouth daily with breakfast. 04/28/23   Caudle, Arcola Kocher, FNP  Glucose Blood (BLOOD GLUCOSE TEST STRIPS) STRP 1 each by In Vitro route in the morning, at noon, and at bedtime. May substitute to any manufacturer covered by patient's insurance. 04/28/23   Caudle, Arcola Kocher, FNP  losartan  (COZAAR ) 50 MG tablet Take 1 tablet (50 mg total) by mouth daily. 07/07/23   Caudle, Arcola Kocher, FNP  meloxicam  (MOBIC ) 15 MG tablet Take 1 tablet (15 mg total) by mouth daily. 07/08/23 07/07/24  Williams, Megan E, NP  methocarbamol  (ROBAXIN ) 500 MG tablet Take 1 tablet (500 mg total) by mouth 3 (three) times daily. 07/08/23   Williams, Megan E, NP  methylPREDNISolone  (MEDROL  DOSEPAK) 4 MG TBPK tablet Take per packet instructions 06/09/23   Amedeo Jupiter, PA-C  pantoprazole  (PROTONIX ) 20 MG tablet Take 1 tablet (20 mg total) by mouth daily. 03/28/23   LampteyDonley Furth, MD  pregabalin  (LYRICA ) 75 MG capsule Take 1 capsule (75 mg total) by mouth 2 (two) times daily as needed (neuropathic pain). 06/09/23   Caudle, Arcola Kocher, FNP  insulin glargine (LANTUS) 100 UNIT/ML injection Inject 16 Units into the skin daily.  04/14/19  [provider]  metFORMIN (GLUCOPHAGE) 500 MG tablet Take 500 mg by mouth.  04/14/19  [provider]  pravastatin (PRAVACHOL) 40 MG tablet Take 40 mg by mouth daily.  04/14/19  [provider]                                                                                                                                    Past Surgical History Past Surgical History:  Procedure Laterality Date   HERNIA REPAIR     Family History Family History  Problem Relation Age of Onset   Cancer Mother    Cancer Father    Hypertension Sister    Heart murmur Sister    CVA Brother    CVA Brother    Hypertension Brother     Social History Social  History   Tobacco Use   Smoking status: Never   Smokeless tobacco: Never  Vaping Use   Vaping status: Never Used  Substance Use Topics   Alcohol use: Not Currently   Drug use: No   Allergies Patient has no known allergies.  Review of Systems Review of Systems  Physical Exam Vital Signs  I have reviewed the triage vital signs BP (!) 183/99   Pulse 61   Temp 98 F (36.7 C) (Oral)   Resp 16   Ht 5\' 6"  (1.676 m)   Wt 65.8 kg   SpO2 100%   BMI 23.40 kg/m  Physical Exam Vitals and nursing note reviewed.  Constitutional:      General: He is not in acute distress.    Appearance: Normal appearance.  HENT:     Mouth/Throat:     Mouth: Mucous membranes are moist.  Eyes:     Conjunctiva/sclera: Conjunctivae normal.  Cardiovascular:     Rate and Rhythm: Normal rate and regular rhythm.     Pulses:          Radial pulses are 2+ on the right side and 2+ on the left side.  Pulmonary:     Effort: Pulmonary effort is normal. No respiratory distress.     Breath sounds: Normal breath sounds.  Abdominal:     General: Abdomen is flat.     Palpations: Abdomen is soft.     Tenderness: There is no abdominal tenderness.  Musculoskeletal:     Right lower leg: No edema.     Left lower leg: No edema.  Skin:    General: Skin is warm and dry.     Capillary Refill: Capillary refill takes less than 2 seconds.  Neurological:     Mental Status: He is alert and oriented to person, place, and time. Mental status is at baseline.  Psychiatric:        Mood and Affect: Mood normal.        Behavior: Behavior normal.     ED Results and Treatments Labs (all labs ordered are listed, but only abnormal results are displayed) Labs Reviewed  COMPREHENSIVE METABOLIC PANEL WITH GFR - Abnormal; Notable for the following components:      Result Value   Glucose, Bld 211 (*)    Calcium 8.1 (*)  Albumin 3.4 (*)    Total Bilirubin 1.9 (*)    All other components within normal limits  CBC WITH  DIFFERENTIAL/PLATELET - Abnormal; Notable for the following components:   WBC 11.3 (*)    All other components within normal limits  TROPONIN I (HIGH SENSITIVITY) - Abnormal; Notable for the following components:   Troponin I (High Sensitivity) 1,081 (*)    All other components within normal limits  HEPARIN LEVEL (UNFRACTIONATED)  CBC  TROPONIN I (HIGH SENSITIVITY)                                                                                                                          Radiology DG Chest Portable 1 View Result Date: 09/25/2023 CLINICAL DATA:  chest pain and hypertension EXAM: PORTABLE CHEST - 1 VIEW COMPARISON:  Sep 05, 2020 FINDINGS: No focal airspace consolidation, pleural effusion, or pneumothorax. No cardiomegaly. No acute fracture or destructive lesion. Multilevel thoracic osteophytosis. IMPRESSION: No acute cardiopulmonary abnormality. Electronically Signed   By: Rance Burrows M.D.   On: 09/25/2023 17:12    Pertinent labs & imaging results that were available during my care of the patient were reviewed by me and considered in my medical decision making (see MDM for details).  Medications Ordered in ED Medications  heparin ADULT infusion 100 units/mL (25000 units/250mL) (850 Units/hr Intravenous New Bag/Given 09/25/23 1824)  heparin bolus via infusion 4,000 Units (4,000 Units Intravenous Bolus from Bag 09/25/23 1825)                                                                                                                                     Procedures .Critical Care  Performed by: Mordecai Applebaum, MD Authorized by: Mordecai Applebaum, MD   Critical care provider statement:    Critical care time (minutes):  30   Critical care was necessary to treat or prevent imminent or life-threatening deterioration of the following conditions:  Cardiac failure   Critical care was time spent personally by me on the following activities:  Development of treatment plan with  patient or surrogate, discussions with consultants, evaluation of patient's response to treatment, examination of patient, ordering and review of laboratory studies, ordering and review of radiographic studies, ordering and performing treatments and interventions, pulse oximetry, re-evaluation of patient's condition and review of old charts   Care discussed with: admitting provider     (  including critical care time)  Medical Decision Making / ED Course   MDM:  53 year old presenting to the emergency department with chest pain.  Patient overall well-appearing.  Vitals with some hypertension.  EKG shows findings of LVH with repolarization abnormality.  No STEMI. Appears similar to previous ECG.   Differential includes ACS, musculoskeletal pain, pneumothorax, pneumonia, doubt pulmonary embolism without recent travel or surgery, hypoxia or pleuritic pain.  Doubt dissection with no radiation to the back and equal pulses.  Will reassess.  Given medical history if he does have an elevated troponin he may end up needing to be admitted for further evaluation and cardiac workup.  Clinical Course as of 09/25/23 Dan Dun Sep 25, 2023  1825 Discussed with Dr. Annitta Kindler with cardiology.  Cardiology will admit patient.  Patient's troponin is over thousand.  He is still chest pain-free.  Discussed with the patient and although he is reluctant to stay in the hospital he is willing to stay in the hospital.  Will start on heparin. [WS]    Clinical Course User Index [WS] Mordecai Applebaum, MD     Additional history obtained: -Additional history obtained from ems -External records from outside source obtained and reviewed including: Chart review including previous notes, labs, imaging, consultation notes including UC notes    Lab Tests: -I ordered, reviewed, and interpreted labs.   The pertinent results include:   Labs Reviewed  COMPREHENSIVE METABOLIC PANEL WITH GFR - Abnormal; Notable for the  following components:      Result Value   Glucose, Bld 211 (*)    Calcium 8.1 (*)    Albumin 3.4 (*)    Total Bilirubin 1.9 (*)    All other components within normal limits  CBC WITH DIFFERENTIAL/PLATELET - Abnormal; Notable for the following components:   WBC 11.3 (*)    All other components within normal limits  TROPONIN I (HIGH SENSITIVITY) - Abnormal; Notable for the following components:   Troponin I (High Sensitivity) 1,081 (*)    All other components within normal limits  HEPARIN LEVEL (UNFRACTIONATED)  CBC  TROPONIN I (HIGH SENSITIVITY)    Notable for elevated troponin  EKG   EKG Interpretation Date/Time:  Sunday September 25 2023 16:12:13 EDT Ventricular Rate:  61 PR Interval:  154 QRS Duration:  81 QT Interval:  411 QTC Calculation: 414 R Axis:   -54  Text Interpretation: Sinus rhythm Probable left atrial enlargement Left anterior fascicular block LVH with secondary repolarization abnormality Confirmed by Hiawatha Lout ([id number]) on 09/25/2023 4:36:15 PM         Imaging Studies ordered: I ordered imaging studies including CXR On my interpretation imaging demonstrates no acute process I independently visualized and interpreted imaging. I agree with the radiologist interpretation   Medicines ordered and prescription drug management: Meds ordered this encounter  Medications   heparin bolus via infusion 4,000 Units   heparin ADULT infusion 100 units/mL (25000 units/250mL)    -I have reviewed the patients home medicines and have made adjustments as needed   Consultations Obtained: I requested consultation with the cardiologist,  and discussed lab and imaging findings as well as pertinent plan - they recommend: admission   Cardiac Monitoring: The patient was maintained on a cardiac monitor.  I personally viewed and interpreted the cardiac monitored which showed an underlying rhythm of: NSR  Reevaluation: After the interventions noted above, I reevaluated the  patient and found that their symptoms have resolved  Co morbidities that complicate the patient evaluation  Past Medical History:  Diagnosis Date   Asthma    Diabetes mellitus without complication (HCC)    Eczema    GERD (gastroesophageal reflux disease)    Hypertension    Sciatica       Dispostion: Disposition decision including need for hospitalization was considered, and patient admitted to the hospital.    Final Clinical Impression(s) / ED Diagnoses Final diagnoses:  NSTEMI (non-ST elevated myocardial infarction) [facility])     This chart was dictated using voice recognition software.  Despite best efforts to proofread,  errors can occur which can change the documentation meaning.    Mordecai Applebaum, MD 09/25/23 [id number]

## 2023-09-25 NOTE — Progress Notes (Signed)
 PHARMACY - ANTICOAGULATION CONSULT NOTE  Pharmacy Consult for heparin Indication: chest pain/ACS  No Known Allergies  Patient Measurements: Height: 5\' 6"  (167.6 cm) Weight: 65.8 kg (145 lb) IBW/kg (Calculated) : 63.8 HEPARIN DW (KG): 65.8  Vital Signs: Temp: 98 F (36.7 C) (06/01 1611) Temp Source: Oral (06/01 1611) BP: 183/99 (06/01 1800) Pulse Rate: 61 (06/01 1800)  Labs: Recent Labs    09/25/23 1636  HGB 13.8  HCT 41.4  PLT 273  CREATININE 1.05  TROPONINIHS 1,081*    Estimated Creatinine Clearance: 72.6 mL/min (by C-G formula based on SCr of 1.05 mg/dL).   Medical History: Past Medical History:  Diagnosis Date   Asthma    Diabetes mellitus without complication (HCC)    Eczema    GERD (gastroesophageal reflux disease)    Hypertension    Sciatica     Assessment: 33 YOM presenting with CP, elevated troponin, he is not on anticoagulation PTA, CBC wnl  Goal of Therapy:  Heparin level 0.3-0.7 units/ml Monitor platelets by anticoagulation protocol: Yes   Plan:  Heparin 4000 units IV x 1, and gtt at 850 units/hr F/u 6 hour heparin level F/u cards eval and recs  Trinidad Funk, PharmD, [facility] Clinical Pharmacist ED Pharmacist Phone # [phone] 09/25/2023 6:15 PM

## 2023-09-25 NOTE — ED Provider Notes (Signed)
 [facility]    CSN: [id number] Arrival date & time: 09/25/23  1513      History   Chief Complaint Chief Complaint  Patient presents with   Chest Pain   Shortness of Breath    HPI Peter Bell is a 53 y.o. male with type 2 diabetes and hypertension, presenting with chest pain.  He drove himself here by personal vehicle today.  Patient reports that he started having chest pain around 1600 yesterday.  He tried to sleep it off and states that when he woke up this morning the chest pain was worse.  Patient reports that he has never had any chest pain like this in his life.  Rates it 7 out of 10.  Goes across his chest and feels like a deep pressure.  Some pain going into his lower jaw.  Occasionally feels short of breath.  No pain rating down his arm.  Patient normally takes amlodipine  and losartan  for his blood pressure, but states he has been out of these medications for the last 3 weeks.  He had a recent death in his family last week and has been feeling very stressed and sad because of this.  Brief history of smoking and alcohol use, but this was a long time ago per patient.  Does not actively smoke or drink or use any type of drugs.  He does not have a cardiologist.  He has a primary care provider he follows with regularly.  He has a family history of hypertension and stroke history in his brothers.  Last A1c 7.5%.  Past Medical History:  Diagnosis Date   Asthma    Diabetes mellitus without complication (HCC)    Eczema    GERD (gastroesophageal reflux disease)    Hypertension    Sciatica     Patient Active Problem List   Diagnosis Date Noted   Impaired gait 03/31/2023   Type 2 diabetes mellitus with hyperglycemia, without long-term current use of insulin (HCC) 03/31/2023   Neuropathic pain 03/31/2023   Spinal stenosis of lumbar region 04/22/2020   Acute pain of left knee 09/17/2019   Protrusion of lumbar intervertebral disc 05/29/2019   SOB (shortness of  breath) 05/10/2013   Essential hypertension, benign 04/24/2013   Chest pain, unspecified 04/24/2013    Past Surgical History:  Procedure Laterality Date   HERNIA REPAIR         Home Medications    Prior to Admission medications   Medication Sig Start Date End Date Taking? Authorizing Provider  amLODipine  (NORVASC ) 10 MG tablet Take 1 tablet (10 mg total) by mouth daily. 07/07/23   Caudle, Arcola Kocher, FNP  Blood Glucose Monitoring Suppl DEVI 1 each by Does not apply route in the morning, at noon, and at bedtime. May substitute to any manufacturer covered by patient's insurance. 04/28/23   Caudle, Arcola Kocher, FNP  glipiZIDE  (GLUCOTROL  XL) 5 MG 24 hr tablet Take 1 tablet (5 mg total) by mouth daily with breakfast. 04/28/23   Caudle, Arcola Kocher, FNP  Glucose Blood (BLOOD GLUCOSE TEST STRIPS) STRP 1 each by In Vitro route in the morning, at noon, and at bedtime. May substitute to any manufacturer covered by patient's insurance. 04/28/23   Caudle, Arcola Kocher, FNP  losartan  (COZAAR ) 50 MG tablet Take 1 tablet (50 mg total) by mouth daily. 07/07/23   Caudle, Arcola Kocher, FNP  meloxicam  (MOBIC ) 15 MG tablet Take 1 tablet (15 mg total) by mouth daily. 07/08/23 07/07/24  Williams, Megan  E, NP  methocarbamol  (ROBAXIN ) 500 MG tablet Take 1 tablet (500 mg total) by mouth 3 (three) times daily. 07/08/23   Williams, Megan E, NP  methylPREDNISolone  (MEDROL  DOSEPAK) 4 MG TBPK tablet Take per packet instructions 06/09/23   Amedeo Jupiter, PA-C  pantoprazole  (PROTONIX ) 20 MG tablet Take 1 tablet (20 mg total) by mouth daily. 03/28/23   LampteyDonley Furth, MD  pregabalin  (LYRICA ) 75 MG capsule Take 1 capsule (75 mg total) by mouth 2 (two) times daily as needed (neuropathic pain). 06/09/23   Caudle, Arcola Kocher, FNP  insulin glargine (LANTUS) 100 UNIT/ML injection Inject 16 Units into the skin daily.  04/14/19  [provider]  metFORMIN (GLUCOPHAGE) 500 MG tablet Take 500 mg by mouth.  04/14/19   [provider]  pravastatin (PRAVACHOL) 40 MG tablet Take 40 mg by mouth daily.  04/14/19  [provider]    Family History Family History  Problem Relation Age of Onset   Cancer Mother    Cancer Father    Hypertension Sister    Heart murmur Sister    CVA Brother    CVA Brother    Hypertension Brother     Social History Social History   Tobacco Use   Smoking status: Never   Smokeless tobacco: Never  Vaping Use   Vaping status: Never Used  Substance Use Topics   Alcohol use: Not Currently   Drug use: No     Allergies   Patient has no known allergies.   Review of Systems Review of Systems  Respiratory:  Positive for shortness of breath.   Cardiovascular:  Positive for chest pain.     Physical Exam Triage Vital Signs ED Triage Vitals  Encounter Vitals Group     BP      Systolic BP Percentile      Diastolic BP Percentile      Pulse      Resp      Temp      Temp src      SpO2      Weight      Height      Head Circumference      Peak Flow      Pain Score      Pain Loc      Pain Education      Exclude from Growth Chart    No data found.  Updated Vital Signs BP (!) 186/105   Pulse 71   Temp (!) 97.4 F (36.3 C) (Oral)   Resp 16   SpO2 98%    Physical Exam Vitals and nursing note reviewed.  Constitutional:      General: He is not in acute distress.    Appearance: Normal appearance. He is not ill-appearing.  HENT:     Head: Normocephalic.     Right Ear: Tympanic membrane, ear canal and external ear normal.     Left Ear: Tympanic membrane, ear canal and external ear normal.     Nose: No congestion.     Mouth/Throat:     Mouth: Mucous membranes are moist.     Pharynx: No oropharyngeal exudate or posterior oropharyngeal erythema.  Eyes:     Extraocular Movements: Extraocular movements intact.     Conjunctiva/sclera: Conjunctivae normal.     Pupils: Pupils are equal, round, and reactive to light.  Cardiovascular:      Rate and Rhythm: Normal rate and regular rhythm.     Pulses: Normal pulses.  Heart sounds: Normal heart sounds. No murmur heard. Pulmonary:     Effort: Pulmonary effort is normal. No respiratory distress.     Breath sounds: Normal breath sounds. No wheezing.  Musculoskeletal:     Cervical back: Normal range of motion.     Right lower leg: No edema.     Left lower leg: No edema.  Skin:    General: Skin is warm.  Neurological:     General: No focal deficit present.     Mental Status: He is alert and oriented to person, place, and time.     Cranial Nerves: No cranial nerve deficit.     Sensory: No sensory deficit.     Motor: No weakness.  Psychiatric:        Mood and Affect: Mood normal.        Behavior: Behavior normal.      UC Treatments / Results  Labs (all labs ordered are listed, but only abnormal results are displayed) Labs Reviewed - No data to display  EKG   Radiology No results found.  Procedures Procedures (including critical care time)  Medications Ordered in UC Medications  aspirin  chewable tablet 324 mg (324 mg Oral Given 09/25/23 1534)    Initial Impression / Assessment and Plan / UC Course  I have reviewed the triage vital signs and the nursing notes.  Pertinent labs & imaging results that were available during my care of the patient were reviewed by me and considered in my medical decision making (see chart for details).     53 year old African-American male presenting by personal vehicle with concerning chest pain.  His blood pressure is unstable. He has been without his medication for the past 3 weeks. I personally reviewed his EKG, which does not appear to have any significant changes from EKG 04/17/23. NSR 61 bpm with LVH noted. No ST elevation.  Regardless of EKG, patient has cardiac risk factors, & has concerning chest pain and elevated blood pressure, needing ED evaluation at this time for labs, troponin.  He was given 4 baby aspirin 's in the  urgent care.  He refused the need for oxygen.  He refused IV placement.  I strongly advised [facility] to come and take him to the emergency department, he was finally agreeable with this plan. Final Clinical Impressions(s) / UC Diagnoses   Final diagnoses:  Chest pain, unspecified type  Hypertensive urgency     Discharge Instructions      You were transported to the ER for further evaluation.    ED Prescriptions   None    PDMP not reviewed this encounter.   AllwardtDeleta Felix, PA-C 09/25/23 1657

## 2023-09-26 ENCOUNTER — Other Ambulatory Visit (HOSPITAL_COMMUNITY): Payer: Self-pay

## 2023-09-26 ENCOUNTER — Encounter (HOSPITAL_COMMUNITY): Payer: Self-pay | Admitting: Cardiology

## 2023-09-26 ENCOUNTER — Encounter (HOSPITAL_COMMUNITY)
Admission: EM | Disposition: A | Discharge status: Home / Self Care | Payer: Self-pay | Source: Home / Self Care | Attending: Cardiology

## 2023-09-26 DIAGNOSIS — I214 Non-ST elevation (NSTEMI) myocardial infarction: Principal | ICD-10-CM

## 2023-09-26 DIAGNOSIS — I2510 Atherosclerotic heart disease of native coronary artery without angina pectoris: Secondary | ICD-10-CM

## 2023-09-26 HISTORY — PX: CORONARY STENT INTERVENTION: CATH118234

## 2023-09-26 HISTORY — PX: LEFT HEART CATH AND CORONARY ANGIOGRAPHY: CATH118249

## 2023-09-26 LAB — CBC
HCT: 39.4 % (ref 39.0–52.0)
HCT: 41.1 % (ref 39.0–52.0)
Hemoglobin: 13 g/dL (ref 13.0–17.0)
Hemoglobin: 14 g/dL (ref 13.0–17.0)
MCH: 30.3 pg (ref 26.0–34.0)
MCH: 31 pg (ref 26.0–34.0)
MCHC: 33 g/dL (ref 30.0–36.0)
MCHC: 34.1 g/dL (ref 30.0–36.0)
MCV: 91.8 fL (ref 80.0–100.0)
MCV: 90.9 fL (ref 80.0–100.0)
Platelets: 251 10*3/uL (ref 150–400)
Platelets: 258 10*3/uL (ref 150–400)
RBC: 4.29 MIL/uL (ref 4.22–5.81)
RBC: 4.52 MIL/uL (ref 4.22–5.81)
RDW: 13 % (ref 11.5–15.5)
RDW: 13.1 % (ref 11.5–15.5)
WBC: 12.7 10*3/uL — ABNORMAL HIGH (ref 4.0–10.5)
WBC: 12.8 10*3/uL — ABNORMAL HIGH (ref 4.0–10.5)
nRBC: 0 % (ref 0.0–0.2)
nRBC: 0 % (ref 0.0–0.2)

## 2023-09-26 LAB — LIPID PANEL
Cholesterol: 156 mg/dL (ref 0–200)
HDL: 32 mg/dL — ABNORMAL LOW (ref >40)
LDL Cholesterol: 113 mg/dL — ABNORMAL HIGH (ref 0–99)
Total CHOL/HDL Ratio: 4.9 ratio
Triglycerides: 54 mg/dL (ref <150)
VLDL: 11 mg/dL (ref 0–40)

## 2023-09-26 LAB — CREATININE, SERUM
Creatinine, Ser: 0.93 mg/dL (ref 0.61–1.24)
GFR, Estimated: >60 mL/min (ref >60)

## 2023-09-26 LAB — BASIC METABOLIC PANEL WITH GFR
Anion gap: 11 (ref 5–15)
BUN: 9 mg/dL (ref 6–20)
CO2: 20 mmol/L — ABNORMAL LOW (ref 22–32)
Calcium: 8.3 mg/dL — ABNORMAL LOW (ref 8.9–10.3)
Chloride: 105 mmol/L (ref 98–111)
Creatinine, Ser: 0.94 mg/dL (ref 0.61–1.24)
GFR, Estimated: >60 mL/min (ref >60)
Glucose, Bld: 195 mg/dL — ABNORMAL HIGH (ref 70–99)
Potassium: 3.4 mmol/L — ABNORMAL LOW (ref 3.5–5.1)
Sodium: 136 mmol/L (ref 135–145)

## 2023-09-26 LAB — CBG MONITORING, ED: Glucose-Capillary: 147 mg/dL — ABNORMAL HIGH (ref 70–99)

## 2023-09-26 LAB — TROPONIN I (HIGH SENSITIVITY)
Troponin I (High Sensitivity): 14989 ng/L (ref <18)
Troponin I (High Sensitivity): 16915 ng/L (ref <18)
Troponin I (High Sensitivity): 16574 ng/L (ref <18)

## 2023-09-26 LAB — HEMOGLOBIN A1C
Hgb A1c MFr Bld: 8.8 % — ABNORMAL HIGH (ref 4.8–5.6)
Mean Plasma Glucose: 205.86 mg/dL

## 2023-09-26 LAB — GLUCOSE, CAPILLARY
Glucose-Capillary: 123 mg/dL — ABNORMAL HIGH (ref 70–99)
Glucose-Capillary: 221 mg/dL — ABNORMAL HIGH (ref 70–99)
Glucose-Capillary: 155 mg/dL — ABNORMAL HIGH (ref 70–99)

## 2023-09-26 LAB — HEPARIN LEVEL (UNFRACTIONATED): Heparin Unfractionated: 0.36 [IU]/mL (ref 0.30–0.70)

## 2023-09-26 LAB — POCT ACTIVATED CLOTTING TIME
Activated Clotting Time: 297 s
Activated Clotting Time: 452 s

## 2023-09-26 LAB — HIV ANTIBODY (ROUTINE TESTING W REFLEX): HIV Screen 4th Generation wRfx: NONREACTIVE

## 2023-09-26 MED ORDER — IOHEXOL 350 MG/ML SOLN
INTRAVENOUS | Status: DC | PRN
Start: 2023-09-26 — End: 2023-09-26
  Administered 2023-09-26: 160 mL via INTRA_ARTERIAL

## 2023-09-26 MED ORDER — FENTANYL CITRATE (PF) 100 MCG/2ML IJ SOLN
INTRAMUSCULAR | Status: AC
Start: 2023-09-26 — End: ?
  Filled 2023-09-26: qty 2

## 2023-09-26 MED ORDER — LIDOCAINE HCL (PF) 1 % IJ SOLN
INTRAMUSCULAR | Status: AC
Start: 2023-09-26 — End: ?
  Filled 2023-09-26: qty 30

## 2023-09-26 MED ORDER — NITROGLYCERIN 1 MG/10 ML FOR IR/CATH LAB
INTRA_ARTERIAL | Status: AC
  Filled 2023-09-26: qty 10

## 2023-09-26 MED ORDER — SODIUM CHLORIDE 0.9% FLUSH
3.0000 mL | INTRAVENOUS | Status: DC | PRN
Start: 2023-09-26 — End: 2023-09-27

## 2023-09-26 MED ORDER — MIDAZOLAM HCL 2 MG/2ML IJ SOLN
INTRAMUSCULAR | Status: DC | PRN
Start: 2023-09-26 — End: 2023-09-26
  Administered 2023-09-26 (×2): 1 mg via INTRAVENOUS

## 2023-09-26 MED ORDER — PRASUGREL HCL 10 MG PO TABS
ORAL_TABLET | ORAL | Status: AC
  Filled 2023-09-26: qty 6

## 2023-09-26 MED ORDER — HEPARIN SODIUM (PORCINE) 1000 UNIT/ML IJ SOLN
INTRAMUSCULAR | Status: DC | PRN
  Administered 2023-09-26 (×2): 3500 [IU] via INTRAVENOUS

## 2023-09-26 MED ORDER — SODIUM CHLORIDE 0.9 % WEIGHT BASED INFUSION: 3.0000 mL/kg/h | INTRAVENOUS | Status: DC

## 2023-09-26 MED ORDER — NITROGLYCERIN 1 MG/10 ML FOR IR/CATH LAB
INTRA_ARTERIAL | Status: DC | PRN
  Administered 2023-09-26 (×2): 200 ug via INTRACORONARY

## 2023-09-26 MED ORDER — ONDANSETRON HCL 4 MG/2ML IJ SOLN
INTRAMUSCULAR | Status: AC
Start: 2023-09-26 — End: 2023-09-27
  Filled 2023-09-26: qty 2

## 2023-09-26 MED ORDER — AMLODIPINE BESYLATE 10 MG PO TABS
10.0000 mg | ORAL_TABLET | Freq: Every day | ORAL | Status: DC
  Administered 2023-09-27: 10 mg via ORAL
  Filled 2023-09-26: qty 1

## 2023-09-26 MED ORDER — SODIUM CHLORIDE 0.9 % WEIGHT BASED INFUSION: 1.0000 mL/kg/h | INTRAVENOUS | Status: DC

## 2023-09-26 MED ORDER — SODIUM CHLORIDE 0.9 % WEIGHT BASED INFUSION
1.0000 mL/kg/h | INTRAVENOUS | Status: DC
  Administered 2023-09-26: 65.8 mL/kg/h via INTRAVENOUS

## 2023-09-26 MED ORDER — ASPIRIN 81 MG PO CHEW: 81.0000 mg | CHEWABLE_TABLET | Freq: Every day | ORAL | Status: DC

## 2023-09-26 MED ORDER — SODIUM CHLORIDE 0.9% FLUSH
3.0000 mL | Freq: Two times a day (BID) | INTRAVENOUS | Status: DC
  Administered 2023-09-26 – 2023-09-27 (×2): 3 mL via INTRAVENOUS

## 2023-09-26 MED ORDER — HEPARIN SODIUM (PORCINE) 1000 UNIT/ML IJ SOLN
INTRAMUSCULAR | Status: AC
  Filled 2023-09-26: qty 10

## 2023-09-26 MED ORDER — PRASUGREL HCL 10 MG PO TABS
ORAL_TABLET | ORAL | Status: DC | PRN
Start: 2023-09-26 — End: 2023-09-26
  Administered 2023-09-26: 60 mg via ORAL

## 2023-09-26 MED ORDER — VERAPAMIL HCL 2.5 MG/ML IV SOLN
INTRAVENOUS | Status: AC
Start: 2023-09-26 — End: ?
  Filled 2023-09-26: qty 2

## 2023-09-26 MED ORDER — HEPARIN (PORCINE) IN NACL 1000-0.9 UT/500ML-% IV SOLN
INTRAVENOUS | Status: DC | PRN
  Administered 2023-09-26 (×2): 500 mL

## 2023-09-26 MED ORDER — PRASUGREL HCL 10 MG PO TABS
10.0000 mg | ORAL_TABLET | Freq: Every day | ORAL | Status: DC
  Administered 2023-09-27: 10 mg via ORAL
  Filled 2023-09-26: qty 1

## 2023-09-26 MED ORDER — LIDOCAINE HCL (PF) 1 % IJ SOLN
INTRAMUSCULAR | Status: DC | PRN
  Administered 2023-09-26: 2 mL

## 2023-09-26 MED ORDER — CARVEDILOL 3.125 MG PO TABS
3.1250 mg | ORAL_TABLET | Freq: Two times a day (BID) | ORAL | Status: DC
  Administered 2023-09-27: 3.125 mg via ORAL
  Filled 2023-09-26: qty 1

## 2023-09-26 MED ORDER — SODIUM CHLORIDE 0.9 % IV SOLN: INTRAVENOUS | Status: AC

## 2023-09-26 MED ORDER — ENOXAPARIN SODIUM 40 MG/0.4ML IJ SOSY
40.0000 mg | PREFILLED_SYRINGE | INTRAMUSCULAR | Status: DC
  Administered 2023-09-27: 40 mg via SUBCUTANEOUS
  Filled 2023-09-26: qty 0.4

## 2023-09-26 MED ORDER — FENTANYL CITRATE (PF) 100 MCG/2ML IJ SOLN
INTRAMUSCULAR | Status: DC | PRN
  Administered 2023-09-26 (×2): 25 ug via INTRAVENOUS

## 2023-09-26 MED ORDER — SODIUM CHLORIDE 0.9 % IV SOLN
250.0000 mL | INTRAVENOUS | Status: DC | PRN
Start: 2023-09-26 — End: 2023-09-27

## 2023-09-26 MED ORDER — SODIUM CHLORIDE 0.9 % IV SOLN
INTRAVENOUS | Status: AC | PRN
  Administered 2023-09-26: 250 mL via INTRAVENOUS

## 2023-09-26 MED ORDER — MIDAZOLAM HCL 2 MG/2ML IJ SOLN
INTRAMUSCULAR | Status: AC
  Filled 2023-09-26: qty 2

## 2023-09-26 MED ORDER — VERAPAMIL HCL 2.5 MG/ML IV SOLN
INTRAVENOUS | Status: DC | PRN
  Administered 2023-09-26: 10 mL via INTRA_ARTERIAL

## 2023-09-26 MED ORDER — SODIUM CHLORIDE 0.9 % WEIGHT BASED INFUSION
3.0000 mL/kg/h | INTRAVENOUS | Status: DC
  Administered 2023-09-26: 3 mL/kg/h via INTRAVENOUS

## 2023-09-26 NOTE — Interval H&P Note (Signed)
 History and Physical Interval Note:  09/26/2023 12:44 PM  Peter Bell  has presented today for surgery, with the diagnosis of nstemi.  The various methods of treatment have been discussed with the patient and family. After consideration of risks, benefits and other options for treatment, the patient has consented to  Procedure(s): LEFT HEART CATH AND CORONARY ANGIOGRAPHY (N/A) as a surgical intervention.  The patient's history has been reviewed, patient examined, no change in status, stable for surgery.  I have reviewed the patient's chart and labs.  Questions were answered to the patient's satisfaction.   Cath Lab Visit (complete for each Cath Lab visit)  Clinical Evaluation Leading to the Procedure:   ACS: Yes.    Non-ACS:    Anginal Classification: CCS IV  Anti-ischemic medical therapy: Minimal Therapy (1 class of medications)  Non-Invasive Test Results: No non-invasive testing performed  Prior CABG: No previous CABG        Donata Fryer [facility] 09/26/2023 12:44 PM

## 2023-09-26 NOTE — Progress Notes (Signed)
 Brief f/w NOTE  Troponin significantly elevated 1081->10,000-> 14,989. Patient has been on NTG drip mainly for BP  (NOT for angina/chest pain) Repeat EKGs obtained LVH with repolarization and ST depression in anterolateral leads, do not see any ST elevation criteria.   BP improved to 145/86 mmhg on low dose NTG.  Patient denies any chest pain  ->continue heparin gtt -> LHC in am.

## 2023-09-26 NOTE — ED Notes (Signed)
 Cardiology at bedside.

## 2023-09-26 NOTE — H&P (View-Only) (Signed)
  Progress Note  Patient Name: Peter Bell Date of Encounter: 09/26/2023 [facility] Cardiologist: Peter Swaziland, MD   Interval Summary   Denies any current chest pain or shortness of breath.  Vital Signs Vitals:   09/26/23 0600 09/26/23 0615 09/26/23 0630 09/26/23 0700  BP: (!) 143/76 (!) 142/78 (!) 144/82 (!) 151/89  Pulse: (!) 58 (!) 54 62 62  Resp: 17 18 16 18   Temp:      TempSrc:      SpO2: 100% 100% 100% 100%  Weight:      Height:        Intake/Output Summary (Last 24 hours) at 09/26/2023 0755 Last data filed at 09/26/2023 0442 Gross per 24 hour  Intake 150.09 ml  Output --  Net 150.09 ml      09/25/2023    4:09 PM 04/28/2023    8:31 AM 03/31/2023    9:01 AM  Last 3 Weights  Weight (lbs) 145 lb 143 lb 3.2 oz 137 lb 4.8 oz  Weight (kg) 65.772 kg 64.955 kg 62.279 kg      Telemetry/ECG  Normal sinus rhythm with resting rates in the 50's to 60's - Personally Reviewed  Physical Exam  GEN: No acute distress.  Appearing stated age. On room air Neck: No JVD Cardiac: RRR, no murmurs, rubs, or gallops.  Respiratory: Clear to auscultation bilaterally. GI: Soft, nontender, non-distended  MS: No edema, radial pulses 1+  Assessment & Plan   NSTEMI Hyperlipidemia Presented to the ED on 09/25/23 for chest pain that is worse with exertion. High Sensitivity troponin's 1081 > 3869 > 10007 > 14989 > 16915. EKG showed LVH, ST depression in lateral and inferior depression.  - Continue IV heparin. - Was not on statin PTA. LDL 113. Goal <70. Atorvastatin 80mg  daily. - Continue aspirin  81mg  daily. Continue Coreg 3.125 BID - Echo pending Informed Consent   Shared Decision Making/Informed Consent{ The risks [stroke (1 in 1000), death (1 in 1000), kidney failure [usually temporary] (1 in 500), bleeding (1 in 200), allergic reaction [possibly serious] (1 in 200)], benefits (diagnostic support and management of coronary artery disease) and alternatives of a cardiac  catheterization were discussed in detail with Peter Bell and he is willing to proceed.      Hypertensive emergency Blood pressures initially elevated. Was started on IV nitroglycerine. Blood pressures labile. Most recent blood pressure is 128/77 On amlodipine  and losartan  PTA but has been out of meds for 3 weeks. -  Continue Amlodipine  10mg  daily - Continue Coreg 3.125 BID - Hold Losartan  50mg  daily until after cardiac cath due to concerns of renal function  Type 2 Diabetes A1C is 8.8.  - Continue SSI. Hold Insulin at meals as is NPO.  History of alcohol use disorder - Denies recent alcohol use.   GERD Continue pantoprazole  20mg  Daily  Lower back pain Continue Robaxin  500mg  TID   For questions or updates, please contact [facility] Please consult [email or web address] for contact info under       Signed, Peter Swann, PA-C

## 2023-09-26 NOTE — Progress Notes (Signed)
  Progress Note  Patient Name: Peter Bell Date of Encounter: 09/26/2023 [facility] Cardiologist: Peter Swaziland, MD   Interval Summary   Denies any current chest pain or shortness of breath.  Vital Signs Vitals:   09/26/23 0600 09/26/23 0615 09/26/23 0630 09/26/23 0700  BP: (!) 143/76 (!) 142/78 (!) 144/82 (!) 151/89  Pulse: (!) 58 (!) 54 62 62  Resp: 17 18 16 18   Temp:      TempSrc:      SpO2: 100% 100% 100% 100%  Weight:      Height:        Intake/Output Summary (Last 24 hours) at 09/26/2023 0755 Last data filed at 09/26/2023 0442 Gross per 24 hour  Intake 150.09 ml  Output --  Net 150.09 ml      09/25/2023    4:09 PM 04/28/2023    8:31 AM 03/31/2023    9:01 AM  Last 3 Weights  Weight (lbs) 145 lb 143 lb 3.2 oz 137 lb 4.8 oz  Weight (kg) 65.772 kg 64.955 kg 62.279 kg      Telemetry/ECG  Normal sinus rhythm with resting rates in the 50's to 60's - Personally Reviewed  Physical Exam  GEN: No acute distress.  Appearing stated age. On room air Neck: No JVD Cardiac: RRR, no murmurs, rubs, or gallops.  Respiratory: Clear to auscultation bilaterally. GI: Soft, nontender, non-distended  MS: No edema, radial pulses 1+  Assessment & Plan   NSTEMI Hyperlipidemia Presented to the ED on 09/25/23 for chest pain that is worse with exertion. High Sensitivity troponin's 1081 > 3869 > 10007 > 14989 > 16915. EKG showed LVH, ST depression in lateral and inferior depression.  - Continue IV heparin. - Was not on statin PTA. LDL 113. Goal <70. Atorvastatin 80mg  daily. - Continue aspirin  81mg  daily. Continue Coreg 3.125 BID - Echo pending Informed Consent   Shared Decision Making/Informed Consent{ The risks [stroke (1 in 1000), death (1 in 1000), kidney failure [usually temporary] (1 in 500), bleeding (1 in 200), allergic reaction [possibly serious] (1 in 200)], benefits (diagnostic support and management of coronary artery disease) and alternatives of a cardiac  catheterization were discussed in detail with Peter Bell and he is willing to proceed.      Hypertensive emergency Blood pressures initially elevated. Was started on IV nitroglycerine. Blood pressures labile. Most recent blood pressure is 128/77 On amlodipine  and losartan  PTA but has been out of meds for 3 weeks. -  Continue Amlodipine  10mg  daily - Continue Coreg 3.125 BID - Hold Losartan  50mg  daily until after cardiac cath due to concerns of renal function  Type 2 Diabetes A1C is 8.8.  - Continue SSI. Hold Insulin at meals as is NPO.  History of alcohol use disorder - Denies recent alcohol use.   GERD Continue pantoprazole  20mg  Daily  Lower back pain Continue Robaxin  500mg  TID   For questions or updates, please contact [facility] Please consult [email or web address] for contact info under       Signed, Peter Swann, PA-C

## 2023-09-26 NOTE — ED Notes (Signed)
 Pt has been NPO since midnight.

## 2023-09-26 NOTE — Plan of Care (Signed)
  Problem: Activity: Goal: Risk for activity intolerance will decrease Outcome: Progressing   Problem: Pain Managment: Goal: General experience of comfort will improve and/or be controlled Outcome: Progressing   Problem: Safety: Goal: Ability to remain free from injury will improve Outcome: Progressing   Problem: Skin Integrity: Goal: Risk for impaired skin integrity will decrease Outcome: Progressing

## 2023-09-26 NOTE — Progress Notes (Signed)
 Pt arrived to 6e29 via stretcher from the cath lab. See assessment.

## 2023-09-26 NOTE — Progress Notes (Signed)
C/O nausea. Medicated.

## 2023-09-26 NOTE — Progress Notes (Signed)
 PHARMACY - ANTICOAGULATION Pharmacy Consult for heparin Indication: chest pain/ACS Brief A/P: Heparin level within goal range Continue Heparin at current rate   No Known Allergies  Patient Measurements: Height: 5\' 6"  (167.6 cm) Weight: 65.8 kg (145 lb) IBW/kg (Calculated) : 63.8 HEPARIN DW (KG): 65.8  Vital Signs: Temp: 98 F (36.7 C) (06/02 0041) Temp Source: Oral (06/02 0041) BP: 139/91 (06/02 0145) Pulse Rate: 74 (06/02 0145)  Labs: Recent Labs    09/25/23 1636 09/25/23 1836 09/25/23 2220 09/26/23 0040  HGB 13.8  --   --  13.0  HCT 41.4  --   --  39.4  PLT 273  --   --  251  HEPARINUNFRC  --   --   --  0.36  CREATININE 1.05  --   --  0.94  TROPONINIHS 1,081* 3,869* 10,007* 14,989*    Estimated Creatinine Clearance: 81.1 mL/min (by C-G formula based on SCr of 0.94 mg/dL).  Assessment: 53 y.o. male with chest pain for heparin   Goal of Therapy:  Heparin level 0.3-0.7 units/ml Monitor platelets by anticoagulation protocol: Yes   Plan:  No change to heparin Follow up after cath today    Claudine Cullens, PharmD, BCPS  09/26/2023 1:59 AM

## 2023-09-27 ENCOUNTER — Telehealth: Payer: Self-pay | Admitting: Student

## 2023-09-27 ENCOUNTER — Inpatient Hospital Stay (HOSPITAL_COMMUNITY): Payer: MEDICAID

## 2023-09-27 ENCOUNTER — Other Ambulatory Visit (HOSPITAL_COMMUNITY): Payer: Self-pay

## 2023-09-27 DIAGNOSIS — I214 Non-ST elevation (NSTEMI) myocardial infarction: Secondary | ICD-10-CM

## 2023-09-27 LAB — CBC
HCT: 37.6 % — ABNORMAL LOW (ref 39.0–52.0)
Hemoglobin: 12.9 g/dL — ABNORMAL LOW (ref 13.0–17.0)
MCH: 31.2 pg (ref 26.0–34.0)
MCHC: 34.3 g/dL (ref 30.0–36.0)
MCV: 91 fL (ref 80.0–100.0)
Platelets: 252 10*3/uL (ref 150–400)
RBC: 4.13 MIL/uL — ABNORMAL LOW (ref 4.22–5.81)
RDW: 13.2 % (ref 11.5–15.5)
WBC: 12.3 10*3/uL — ABNORMAL HIGH (ref 4.0–10.5)
nRBC: 0 % (ref 0.0–0.2)

## 2023-09-27 LAB — BASIC METABOLIC PANEL WITH GFR
Anion gap: 5 (ref 5–15)
BUN: 10 mg/dL (ref 6–20)
CO2: 25 mmol/L (ref 22–32)
Calcium: 8.1 mg/dL — ABNORMAL LOW (ref 8.9–10.3)
Chloride: 106 mmol/L (ref 98–111)
Creatinine, Ser: 1.02 mg/dL (ref 0.61–1.24)
GFR, Estimated: >60 mL/min (ref >60)
Glucose, Bld: 127 mg/dL — ABNORMAL HIGH (ref 70–99)
Potassium: 3.6 mmol/L (ref 3.5–5.1)
Sodium: 136 mmol/L (ref 135–145)

## 2023-09-27 LAB — ECHOCARDIOGRAM COMPLETE
Area-P 1/2: 4.39 cm2
Height: 66 in
S' Lateral: 3.2 cm
Weight: 2195.2 [oz_av]

## 2023-09-27 LAB — GLUCOSE, CAPILLARY
Glucose-Capillary: 147 mg/dL — ABNORMAL HIGH (ref 70–99)
Glucose-Capillary: 175 mg/dL — ABNORMAL HIGH (ref 70–99)

## 2023-09-27 LAB — LIPOPROTEIN A (LPA): Lipoprotein (a): 200 nmol/L — ABNORMAL HIGH (ref <75.0)

## 2023-09-27 MED ORDER — CARVEDILOL 6.25 MG PO TABS
6.2500 mg | ORAL_TABLET | Freq: Two times a day (BID) | ORAL | 11 refills | Status: DC
Start: 2023-09-27 — End: 2023-10-21
  Filled 2023-09-27: qty 60, 30d supply, fill #0

## 2023-09-27 MED ORDER — LOSARTAN POTASSIUM 25 MG PO TABS
25.0000 mg | ORAL_TABLET | Freq: Every day | ORAL | 3 refills | Status: DC
  Filled 2023-09-27: qty 30, 30d supply, fill #0

## 2023-09-27 MED ORDER — ASPIRIN 81 MG PO TBEC
81.0000 mg | DELAYED_RELEASE_TABLET | Freq: Every day | ORAL | 3 refills | Status: AC
  Filled 2023-09-27: qty 90, 90d supply, fill #0

## 2023-09-27 MED ORDER — ATORVASTATIN CALCIUM 80 MG PO TABS
80.0000 mg | ORAL_TABLET | Freq: Every day | ORAL | 3 refills | Status: DC
  Filled 2023-09-27 – 2023-10-31 (×2): qty 30, 30d supply, fill #0

## 2023-09-27 MED ORDER — PRASUGREL HCL 10 MG PO TABS
10.0000 mg | ORAL_TABLET | Freq: Every day | ORAL | 3 refills | Status: DC
  Filled 2023-09-27: qty 30, 30d supply, fill #0

## 2023-09-27 MED ORDER — LIVING WELL WITH DIABETES BOOK
Freq: Once | Status: AC
  Filled 2023-09-27: qty 1

## 2023-09-27 MED ORDER — METFORMIN HCL 500 MG PO TABS
500.0000 mg | ORAL_TABLET | Freq: Two times a day (BID) | ORAL | 11 refills | Status: DC
  Filled 2023-09-27: qty 60, 30d supply, fill #0

## 2023-09-27 MED ORDER — AMLODIPINE BESYLATE 10 MG PO TABS
10.0000 mg | ORAL_TABLET | Freq: Every day | ORAL | 3 refills | Status: DC
  Filled 2023-09-27 – 2023-10-31 (×2): qty 30, 30d supply, fill #0

## 2023-09-27 MED ORDER — EMPAGLIFLOZIN 10 MG PO TABS
10.0000 mg | ORAL_TABLET | Freq: Every day | ORAL | Status: DC
  Administered 2023-09-27: 10 mg via ORAL
  Filled 2023-09-27: qty 1

## 2023-09-27 MED ORDER — LOSARTAN POTASSIUM 50 MG PO TABS
50.0000 mg | ORAL_TABLET | Freq: Every day | ORAL | Status: DC
  Administered 2023-09-27: 50 mg via ORAL
  Filled 2023-09-27: qty 1

## 2023-09-27 MED ORDER — NITROGLYCERIN 0.4 MG SL SUBL
0.4000 mg | SUBLINGUAL_TABLET | SUBLINGUAL | 0 refills | Status: AC | PRN
  Filled 2023-09-27: qty 25, 15d supply, fill #0

## 2023-09-27 MED ORDER — PANTOPRAZOLE SODIUM 20 MG PO TBEC
20.0000 mg | DELAYED_RELEASE_TABLET | Freq: Every day | ORAL | 0 refills | Status: DC
  Filled 2023-09-27: qty 14, 14d supply, fill #0

## 2023-09-27 MED ORDER — EMPAGLIFLOZIN 10 MG PO TABS
10.0000 mg | ORAL_TABLET | Freq: Every day | ORAL | 11 refills | Status: DC
  Filled 2023-09-27 – 2023-12-20 (×4): qty 30, 30d supply, fill #0

## 2023-09-27 NOTE — Discharge Summary (Signed)
 Discharge Summary   Patient ID: Peter Bell MRN: [id number]; DOB: 1971/03/27  Admit date: 09/25/2023 Discharge date: 09/27/2023  PCP:  Nonda Bays, FNP   [facility] Providers Cardiologist:  Gaylyn Keas, MD       Discharge Diagnoses  Principal Problem:   NSTEMI (non-ST elevated myocardial infarction) [facility])   Diagnostic Studies/Procedures  Cardiac catheterization on 09/27/23    Prox RCA lesion is 30% stenosed.   Dist RCA lesion is 40% stenosed.   RPDA lesion is 45% stenosed.   Prox LAD lesion is 90% stenosed.   Mid LAD lesion is 50% stenosed.   Dist LAD lesion is 50% stenosed.   Ost Cx lesion is 70% stenosed.   1st Mrg lesion is 95% stenosed.   2nd Mrg lesion is 75% stenosed.   A drug-eluting stent was successfully placed using a STENT SYNERGY XD 3.0X16.   A drug-eluting stent was successfully placed using a STENT SYNERGY XD 2.50X20.   A drug-eluting stent was successfully placed using a STENT SYNERGY XD 2.75X8.   Post intervention, there is a 0% residual stenosis.   Post intervention, there is a 0% residual stenosis.   Post intervention, there is a 0% residual stenosis.   The left ventricular systolic function is normal.   LV end diastolic pressure is mildly elevated.   The left ventricular ejection fraction is 55-65% by visual estimate.   Recommend uninterrupted dual antiplatelet therapy with Aspirin  81mg  daily and Prasugrel 10mg  daily for a minimum of 12 months (ACS-Class I recommendation).   2 vessel obstructive CAD Normal LV function Elevated LVEDP 25 mm Hg Successful PCI of the proximal LAD with DES Successful PCI of the fist OM with DES Successful PCI of the ostial LCx with DES   Plan: DAPT for one year. Anticipate DC tomorrow. Risk factor modification.  _____________   History of Present Illness   Peter Bell is a 53 y.o. male with hypertension, spinal stenosis, poorly controlled diabetes type 2 who is being seen 09/25/2023 for the  evaluation of chest pain and hypertension.  At home the patient was taking losartan  and amlodipine  for his hypertension but has been out of this medication for 3 weeks.  The patient was admitted to the cardiology service for elevated high-sensitivity troponin and chest pain.    Hospital Course       NSTEMI CAD Hyperlipidemia LVH The patient's chest pain was worse with exertion and relieved by nitroglycerin. In the emergency room high-sensitivity troponin trended up 1081 > 3869 > 10007 > 14989 > 16915.  EKG showed LVH, inferior lateral ST depression.  The patient was started on IV heparin, IV nitroglycerin, atorvastatin 80mg  daily, Coreg 3.125 twice daily, received aspirin  324 chewable, and started on aspirin  81 mg daily.  The LDL was checked and is 113. Patient was scheduled for a LHC and received this on 09/27/23 that showed prox RCA lesion is 30% stenosed. Dist RCA lesion is 40% stenosed. RPDA lesion is 45% stenosed. Prox LAD lesion is 90% stenosed. Mid LAD lesion is 50% stenosed. Dist LAD lesion is 50% stenosed. Ost Cx lesion is 70% stenosed.1st Mrg lesion is 95% stenosed. 2nd Mrg lesion is 75% stenosed. He received DES x3 to LAD, 1st OM, and Ost Lcx. Was recommended to be on Aspirin  81mg  daily and Prasugrel 10mg  daily for 12 months.  Pharmacy and case manager have assisted with cost concerns for Prasugrel and 90 days should cost about $30. The IV nitroglycerin and IV heparin were  stopped prior to receiving the cardiac catheterization.  Following the cardiac catheterization the patient reported his chest pain and shortness of breath have improved.  Right radial site had minimal hematoma, and minimal swelling, pulses were present. Dr. [place] reviewed the patient's echo and feels like the LVEF is normal but did note that the patient has LVH.  Suspect that the LVH may be secondary to poorly controlled blood pressure. Plan to start Jardiance 10 mg daily for cardiac protection and the patient's diabetes.   The patient does not have any insurance for medications, pharmacy is helping with cost concerns. On 09/27/23 the patient was felt to be stable to discharge home. The patient has follow-up scheduled with his PCP and they can assist titration of Jardiance from a diabetes perspective.  Cardiology follow-up scheduled with Angie Duke on 10/10/2023. -  continue Aspirin  81mg  daily and Prasugrel 10mg  daily for 12 months.  - atorvastatin 80mg  daily. Was started this admission will need follow up LFT's and Lipid panel in clinic.  - Increase Coreg to 6.25 BID. - Start Losartan  25mg  daily to help with renal protection   Hypertensive emergency Blood pressures were elevated when the patient initially presented to the emergency department. Was prescribed amlodipine  and losartan  PTA but has been out of meds for 3 weeks.  Patient's home amlodipine  10 mg daily was restarted.  Also started on IV nitroglycerin and Coreg as mentioned above.  The patient's home losartan  was held until after the cardiac catheterization.  Since the blood pressures have continued to be elevated will restart losartan  50 mg daily to help with renal protection due to the patient's diabetes. - Increase Coreg to 6.25 BID. - Start Losartan  25mg  daily to help with renal protection. May need to up titrate blood pr in clinic follow up   Type 2 Diabetes Patient's diabetes is poorly controlled and his A1C is 8.8.  He reported taking no medications for his diabetes at home. On hospitalization the patient was on sliding scale insulin.  Start Jardiance as mentioned above. Due to the recent cardiac catheterization and risk of AKI plan to start metformin 500 mg twice daily on 09/29/2023.  Diabetes coordinator is helping the patient get a glucose meter prior to discharge. Patient already has follow-up scheduled with PCP on 10/26/2023 needs assistance from PCP to help improve management of Diabetes.  - Start metformin 500 mg twice daily on 09/29/2023 - Start Jardiance  10 mg daily  History of alcohol use disorder - Denies recent alcohol use.    GERD Continue pantoprazole  20mg  daily   Lower back pain Stop Robaxin  500mg  TID. Was not taking this prior to hospitalization due to cost concerns. Can discuss with PCP at follow up.    Did the patient have an acute coronary syndrome (MI, NSTEMI, STEMI, etc) this admission?:  Yes                                AHA/ACC ACS Clinical Performance & Quality Measures: Aspirin  prescribed? - Yes ADP Receptor Inhibitor (Plavix/Clopidogrel, Brilinta/Ticagrelor or Effient/Prasugrel) prescribed (includes medically managed patients)? - Yes Beta Blocker prescribed? - Yes High Intensity Statin (Lipitor 40-80mg  or Crestor 20-40mg ) prescribed? - Yes EF assessed during THIS hospitalization? - Yes For EF <40%, was ACEI/ARB prescribed? - Not Applicable (EF >/= 40%) For EF <40%, Aldosterone Antagonist (Spironolactone or Eplerenone) prescribed? - Not Applicable (EF >/= 40%) Cardiac Rehab Phase II ordered (including medically managed patients)? -  Yes       The patient will be scheduled for a TOC follow up appointment in 13 days.  A message has been sent to the [facility] and Scheduling Pool at the office where the patient should be seen for follow up.  _____________  Discharge Vitals Blood pressure (!) 143/93, pulse 67, temperature 98.4 F (36.9 C), temperature source Oral, resp. rate 17, height 5\' 6"  (1.676 m), weight 62.2 kg, SpO2 100%.  Filed Weights   09/25/23 1609 09/26/23 1717  Weight: 65.8 kg 62.2 kg   Physical Exam Constitutional:      Appearance: He is well-developed.  HENT:     Head: Normocephalic.  Cardiovascular:     Rate and Rhythm: Normal rate and regular rhythm.  Chest:     Chest wall: No deformity or edema.  Musculoskeletal:     Right wrist: Swelling present.     Comments: Right radial cath site has minimal hematoma and swelling. Patient denies any pain.  Skin:    General: Skin is warm and dry.   Neurological:     General: No focal deficit present.     Mental Status: He is alert.      Labs & Radiologic Studies  CBC Recent Labs    09/25/23 1636 09/26/23 0040 09/26/23 1816 09/27/23 0520  WBC 11.3*   < > 12.8* 12.3*  NEUTROABS 7.6  --   --   --   HGB 13.8   < > 14.0 12.9*  HCT 41.4   < > 41.1 37.6*  MCV 91.2   < > 90.9 91.0  PLT 273   < > 258 252   < > = values in this interval not displayed.   Basic Metabolic Panel Recent Labs    16/10/96 0040 09/26/23 1816 09/27/23 0520  NA 136  --  136  K 3.4*  --  3.6  CL 105  --  106  CO2 20*  --  25  GLUCOSE 195*  --  127*  BUN 9  --  10  CREATININE 0.94 0.93 1.02  CALCIUM 8.3*  --  8.1*   Liver Function Tests Recent Labs    09/25/23 1636  AST 36  ALT 13  ALKPHOS 89  BILITOT 1.9*  PROT 7.0  ALBUMIN 3.4*   No results for input(s): "LIPASE", "AMYLASE" in the last 72 hours. High Sensitivity Troponin:   Recent Labs  Lab 09/25/23 1836 09/25/23 2220 09/26/23 0040 09/26/23 0435 09/26/23 0749  TROPONINIHS 3,869* 10,007* 14,989* 04,540* 16,574*    No results for input(s): "TRNPT" in the last 720 hours.  BNP Invalid input(s): "POCBNP" No results for input(s): "PROBNP" in the last 72 hours.  No results for input(s): "BNP" in the last 72 hours.  D-Dimer No results for input(s): "DDIMER" in the last 72 hours. Hemoglobin A1C Recent Labs    09/26/23 0040  HGBA1C 8.8*   Fasting Lipid Panel Recent Labs    09/26/23 0040  CHOL 156  HDL 32*  LDLCALC 113*  TRIG 54  CHOLHDL 4.9   Lipoprotein (a)  Date/Time Value Ref Range Status  09/26/2023 12:40 AM 200.0 (H) <75.0 nmol/L Final    Comment:    (NOTE) Note:  Values greater than or equal to 75.0 nmol/L may       indicate an independent risk factor for CHD,       but must be evaluated with caution when applied       to non-Caucasian populations due to the  influence of genetic factors on Lp(a) across       ethnicities. Performed At: [facility] [address] [place], [place] [id number] Pearlean Botts MD VZ:[id number]     Thyroid Function Tests No results for input(s): "TSH", "T4TOTAL", "T3FREE", "THYROIDAB" in the last 72 hours.  Invalid input(s): "FREET3" _____________  CARDIAC CATHETERIZATION Result Date: 09/26/2023   Prox RCA lesion is 30% stenosed.   Dist RCA lesion is 40% stenosed.   RPDA lesion is 45% stenosed.   Prox LAD lesion is 90% stenosed.   Mid LAD lesion is 50% stenosed.   Dist LAD lesion is 50% stenosed.   Ost Cx lesion is 70% stenosed.   1st Mrg lesion is 95% stenosed.   2nd Mrg lesion is 75% stenosed.   A drug-eluting stent was successfully placed using a STENT SYNERGY XD 3.0X16.   A drug-eluting stent was successfully placed using a STENT SYNERGY XD 2.50X20.   A drug-eluting stent was successfully placed using a STENT SYNERGY XD 2.75X8.   Post intervention, there is a 0% residual stenosis.   Post intervention, there is a 0% residual stenosis.   Post intervention, there is a 0% residual stenosis.   The left ventricular systolic function is normal.   LV end diastolic pressure is mildly elevated.   The left ventricular ejection fraction is 55-65% by visual estimate.   Recommend uninterrupted dual antiplatelet therapy with Aspirin  81mg  daily and Prasugrel 10mg  daily for a minimum of 12 months (ACS-Class I recommendation). 2 vessel obstructive CAD Normal LV function Elevated LVEDP 25 mm Hg Successful PCI of the proximal LAD with DES Successful PCI of the fist OM with DES Successful PCI of the ostial LCx with DES Plan: DAPT for one year. Anticipate DC tomorrow. Risk factor modification.   DG Chest Portable 1 View Result Date: 09/25/2023 CLINICAL DATA:  chest pain and hypertension EXAM: PORTABLE CHEST - 1 VIEW COMPARISON:  Sep 05, 2020 FINDINGS: No focal airspace consolidation, pleural effusion, or pneumothorax. No cardiomegaly. No acute fracture or destructive lesion. Multilevel thoracic osteophytosis. IMPRESSION: No  acute cardiopulmonary abnormality. Electronically Signed   By: Rance Burrows M.D.   On: 09/25/2023 17:12    Disposition Pt is being discharged home today in good condition.  Follow-up Plans & Appointments  Follow-up Information     Lamond Pilot, PA Follow up.   Specialties: Cardiology, Radiology Why: Cardiology follow up with Marcie Sever PA-C on 10/10/23 at 8:25 am. Please arrive 15 minutes early. Shelvy Dickens is a member of are heart care team at [facility]. Contact information: [address] [place] [address] [phone]         Nonda Bays, FNP Follow up.   Specialty: Family Medicine Why: Family Medicine follow up with Sylvester Evert FNP on 10/26/23 at 9:10 am. Please arrive 15 minutes early. Contact information: [address] [place] [address] [phone]         Nonda Bays, FNP Follow up.   Specialty: Family Medicine Why: TIME : 9:10 AM   PLEASE ARRIVE AT 8:45 AM DATE:  Roetta Clarke  PLEASE BRING ALL MEDICATION and ID Contact information: [address] [place] [address] [place] [address] [phone]                Discharge Instructions     AMB Referral to Phase II Cardiac Rehab   Complete by: As directed    Diagnosis: Coronary Stents   After initial evaluation and assessments completed: Virtual Based  Care may be provided alone or in conjunction with Phase 2 Cardiac Rehab based on patient barriers.: Yes   Intensive Cardiac Rehabilitation (ICR) [facility] location only OR Traditional Cardiac Rehabilitation (TCR) *If criteria for ICR are not met will enroll in TCR [facility] only): Yes   Diet - low sodium heart healthy   Complete by: As directed    Discharge instructions   Complete by: As directed    Please wait to start metformin until 09/29/23   Increase activity slowly   Complete by: As directed      Cardiology follow up with Angie Duke on 10/10/23 at 8:25am  Follow up with PCP on  10/26/23  Discharge Medications Allergies as of 09/27/2023   No Known Allergies      Medication List     STOP taking these medications    Blood Glucose Monitoring Suppl Devi   BLOOD GLUCOSE TEST STRIPS Strp   ibuprofen  200 MG tablet Commonly known as: ADVIL        TAKE these medications    amLODipine  10 MG tablet Commonly known as: NORVASC  Take 1 tablet (10 mg total) by mouth daily.   aspirin  EC 81 MG tablet Take 1 tablet (81 mg total) by mouth daily. Swallow whole. Start taking on: September 28, 2023   atorvastatin 80 MG tablet Commonly known as: LIPITOR Take 1 tablet (80 mg total) by mouth daily. Start taking on: September 28, 2023   carvedilol 6.25 MG tablet Commonly known as: Coreg Take 1 tablet (6.25 mg total) by mouth 2 (two) times daily.   empagliflozin 10 MG Tabs tablet Commonly known as: JARDIANCE Take 1 tablet (10 mg total) by mouth daily.   losartan  25 MG tablet Commonly known as: COZAAR  Take 1 tablet (25 mg total) by mouth daily. What changed:  medication strength how much to take   metFORMIN 500 MG tablet Commonly known as: GLUCOPHAGE Take 1 tablet (500 mg total) by mouth 2 (two) times daily with a meal. Start taking on: September 29, 2023   nitroGLYCERIN 0.4 MG SL tablet Commonly known as: NITROSTAT Place 1 tablet (0.4 mg total) under the tongue every 5 (five) minutes x 3 doses as needed for chest pain.   pantoprazole  20 MG tablet Commonly known as: PROTONIX  Take 1 tablet (20 mg total) by mouth daily.   prasugrel 10 MG Tabs tablet Commonly known as: EFFIENT Take 1 tablet (10 mg total) by mouth daily. Start taking on: September 28, 2023         Outstanding Labs/Studies   Duration of Discharge Encounter: APP Time: 20 minutes   Signed, Melita Springer, PA-C 09/27/2023, 12:11 PM

## 2023-09-27 NOTE — Telephone Encounter (Signed)
 Pt discharged from the hospital today.  Triage nursing to place TCM call tomorrow 09/28/23.

## 2023-09-27 NOTE — Telephone Encounter (Signed)
   Transition of Care Follow-up Phone Call Request    Patient Name: Peter Bell Date of Birth: [birth year] Date of Encounter: 09/27/2023  Primary Care Provider:  Nonda Bays, FNP Primary Cardiologist:  Gaylyn Keas, MD  Caroline Cinnamon has been scheduled for a transition of care follow up appointment with a [facility] provider:  Marcie Sever PA-C on 10/10/23  Please reach out to [employer] within 48 hours of discharge to confirm appointment and review transition of care protocol questionnaire. Anticipated discharge date: 09/27/23  Melita Springer, PA-C  09/27/2023, 10:54 AM

## 2023-09-27 NOTE — Progress Notes (Signed)
 CARDIAC REHAB PHASE I    Pt resting in bed, feeling well today. Reports ambulating independently, tolerating well with no CP, SOB or dizziness.  Post MI/stent education including site care, restrictions, risk factors, exercise guidelines, NTG use, antiplatelet therapy importance, heart healthy diabetic diet, MI booklet and CRP2 reviewed. All questions and concerns addressed. Will refer to [facility] for CRP2. Plan for home later today.    [id number] Ronny Colas, RN BSN 09/27/2023 11:15 AM

## 2023-09-27 NOTE — Plan of Care (Signed)

## 2023-09-27 NOTE — TOC Initial Note (Signed)
 Transition of Care [facility]) - Initial/Assessment Note    Patient Details  Name: Peter Bell MRN: [id number] Date of Birth: 13-Jan-1971  Transition of Care [facility]) CM/SW Contact:    Peter Diones, RN Phone Number: 09/27/2023, 10:16 AM  Clinical Narrative: Patient presented for chest pain-Nstemi. PTA patient states he is independent from home with significant other. Patient states he works; however, does not have insurance. Patient is agreeable to Case Manager submitting referral to Financial Counselor for Medicaid Eligibility-completed. CMA to arrange a hospital follow up appointment for this patient. Diabetes Coordinator to assist with diabetes supplies and meter. Case Manager will utilize [facility] for additional meds if within the budget. Unit Pharmacist is working on Effient for $50.00. Pharmacy is also working on Jardiance via the patient assistance application. MATCH letter attached.               MATCH MEDICATION ASSISTANCE CARD Pharmacies please call [phone] for claim processing assistance.  Rx BIN: [id number] Rx Group: [id number] Rx PCN: [id number] Relationship Code: 1 Person Code: [id number]  Patient ID (MRN): [name]    Patient Name: Peter Bell    Patient DOB: Sep 20, 1970   Discharge Date: 09-27-23  Expiration Date: 10-05-23  (must be filled within 7 days of discharge)          Expected Discharge Plan: Home/Self Care Barriers to Discharge: No Barriers Identified   Patient Goals and CMS Choice Patient states their goals for this hospitalization and ongoing recovery are:: plan to return home with girlfriend.   Choice offered to / list presented to : NA      Expected Discharge Plan and Services In-house Referral: Financial Counselor Discharge Planning Services: MATCH Program, Medication Assistance, CM Consult, Follow-up appt scheduled Post Acute Care Choice: NA Living arrangements for the past 2 months: Apartment                   DME Agency: NA       HH Arranged:  NA          Prior Living Arrangements/Services Living arrangements for the past 2 months: Apartment Lives with:: Significant Other Patient language and need for interpreter reviewed:: Yes Do you feel safe going back to the place where you live?: Yes      Need for Family Participation in Patient Care: No (Comment) Care giver support system in place?: No (comment)   Criminal Activity/Legal Involvement Pertinent to Current Situation/Hospitalization: No - Comment as needed  Activities of Daily Living   ADL Screening (condition at time of admission) Independently performs ADLs?: Yes (appropriate for developmental age) Is the patient deaf or have difficulty hearing?: No Does the patient have difficulty seeing, even when wearing glasses/contacts?: No Does the patient have difficulty concentrating, remembering, or making decisions?: No  Permission Sought/Granted Permission sought to share information with : Other (comment), Case Manager                Emotional Assessment Appearance:: Appears stated age Attitude/Demeanor/Rapport: Engaged Affect (typically observed): Appropriate Orientation: : Oriented to Self, Oriented to Place, Oriented to  Time, Oriented to Situation Alcohol / Substance Use: Not Applicable Psych Involvement: No (comment)  Admission diagnosis:  NSTEMI (non-ST elevated myocardial infarction) [facility]) [I21.4] Patient Active Problem List   Diagnosis Date Noted   NSTEMI (non-ST elevated myocardial infarction) (HCC) 09/25/2023   Impaired gait 03/31/2023   Type 2 diabetes mellitus with hyperglycemia, without long-term current use of insulin (HCC) 03/31/2023   Neuropathic pain 03/31/2023   Spinal stenosis  of lumbar region 04/22/2020   Acute pain of left knee 09/17/2019   Protrusion of lumbar intervertebral disc 05/29/2019   SOB (shortness of breath) 05/10/2013   Essential hypertension, benign 04/24/2013   Chest pain, unspecified 04/24/2013   PCP:  Peter Bays, FNP Pharmacy:   [facility] [phone] - [place], [place] - [address] [facility] [address] [place] [address] Phone: [phone] Fax: [phone]  [facility] [address] [place] [address] Phone: [phone] Fax: [phone]     Social Drivers of Health (SDOH) Social History: SDOH Screenings   Food Insecurity: No Food Insecurity (09/26/2023)  Housing: Low Risk  (09/26/2023)  Transportation Needs: No Transportation Needs (09/26/2023)  Utilities: Not At Risk (09/26/2023)  Alcohol Screen: Low Risk  (04/28/2023)  Depression (PHQ2-9): Low Risk  (04/28/2023)  Financial Resource Strain: High Risk (04/28/2023)  Physical Activity: Inactive (04/28/2023)  Social Connections: Socially Isolated (04/28/2023)  Stress: No Stress Concern Present (04/28/2023)  Tobacco Use: Low Risk  (09/25/2023)  Health Literacy: Adequate Health Literacy (04/28/2023)   SDOH Interventions:     Readmission Risk Interventions     No data to display

## 2023-09-27 NOTE — Discharge Instructions (Signed)
Radial Site Care Refer to this sheet in the next few weeks. These instructions provide you with information on caring for yourself after your procedure. Your caregiver may also give you more specific instructions. Your treatment has been planned according to current medical practices, but problems sometimes occur. Call your caregiver if you have any problems or questions after your procedure. HOME CARE INSTRUCTIONS  You may shower the day after the procedure.Remove the bandage (dressing) and gently wash the site with plain soap and water.Gently pat the site dry.   Do not apply powder or lotion to the site.   Do not submerge the affected site in water for 3 to 5 days.   Inspect the site at least twice daily.   Do not flex or bend the affected arm for 24 hours.   No lifting over 5 pounds (2.3 kg) for 5 days after your procedure.   Do not drive home if you are discharged the same day of the procedure. Have someone else drive you.   You may drive 24 hours after the procedure unless otherwise instructed by your caregiver.  What to expect:  Any bruising will usually fade within 1 to 2 weeks.   Blood that collects in the tissue (hematoma) may be painful to the touch. It should usually decrease in size and tenderness within 1 to 2 weeks.  SEEK IMMEDIATE MEDICAL CARE IF:  You have unusual pain at the radial site.   You have redness, warmth, swelling, or pain at the radial site.   You have drainage (other than a small amount of blood on the dressing).   You have chills.   You have a fever or persistent symptoms for more than 72 hours.   You have a fever and your symptoms suddenly get worse.   Your arm becomes pale, cool, tingly, or numb.   You have heavy bleeding from the site. Hold pressure on the site.

## 2023-09-27 NOTE — Inpatient Diabetes Management (Addendum)
 Inpatient Diabetes Program Recommendations  AACE/ADA: New Consensus Statement on Inpatient Glycemic Control (2015)  Target Ranges:  Prepandial:   less than 140 mg/dL      Peak postprandial:   less than 180 mg/dL (1-2 hours)      Critically ill patients:  140 - 180 mg/dL    Latest Reference Range & Units 03/31/23 10:01 09/26/23 00:40  Hemoglobin A1C 4.8 - 5.6 % 7.5 (H) 8.8 (H)  205 mg/dl  (H): Data is abnormally high  Latest Reference Range & Units 09/26/23 07:38 09/26/23 14:35 09/26/23 17:28 09/26/23 21:59  Glucose-Capillary 70 - 99 mg/dL 829 (H) 562 (H) 130 (H) 155 (H)  (H): Data is abnormally high   Latest Reference Range & Units 09/27/23 08:18  Glucose-Capillary 70 - 99 mg/dL 865 (H)  (H): Data is abnormally high    Admit with:  Hypertensive emergency, poorly controlled hypertension. Chest pain/NSTEMI troponin 1081  History: DM2  Home DM Meds: None [facility]       Looks like PCP visit 04/28/2023 NP added Glipizide  XR 5 mg daily  Current Orders: Jardiance 10 mg daily     Novolog Moderate Correction Scale/ SSI (0-15 units) TID AC + HS    PCP: Sylvester Evert, NP with [facility] Last Seen 04/28/2023 NP notes state pt had previously taken Metformin and Lantus Was not taking any meds at this visit in January NP started pt on Glipizide  XR 5 mg daily   Needs CBG meter for d/c   Met w/ pt at bedside prior to d/c.  Pt told me he never started the Glipizide  that his PCP prescribed in Jan.  Doesn't have CBG meter at home.  We talked about the importance of good CBG control at home especially to help prevent further cardiac events.  Spoke with patient about his current A1c of 8.8%.  Explained what an A1c is and what it measures.  Reminded patient that his goal A1c is 7% or less per ADA standards to prevent both acute and long-term complications.  Explained to patient the extreme importance of good glucose control at home.  Encouraged patient to check his CBGs at  least bid at home (fasting and another check within the day) and to record all CBGs in a logbook for his PCP to review. Reviewed goal CBGs for home as well.  Also reviewed with pt the 2 meds he will go home with: Metformin + Jardiance.  Explained what each med is, how they work, side effects, when to take, etc.  Reviewed signs and symptoms of High and Low CBGs and how to treat either condition.  Also gave pt CBG meter, strips, lancet device, and lancets from the inpatient diabetes CBG meter fund.  Discussed w/ pt that he can purchase all the components of the CBG meter pack at [facility].  Pt appreciative and did not have any questions for me at this time.     --Will follow patient during hospitalization--  Langston Pippins RN, MSN, CDCES Diabetes Coordinator Inpatient Glycemic Control Team Team Pager: [phone] (8a-5p)

## 2023-09-28 NOTE — Telephone Encounter (Signed)
 Transition Care Management Unsuccessful Follow-up Telephone Call  Date of discharge and from where:  09/27/23 from [facility]  Attempts:  1st Attempt  Reason for unsuccessful TCM follow-up call:  Voice mail full

## 2023-09-30 ENCOUNTER — Ambulatory Visit: Payer: Self-pay | Admitting: Cardiology

## 2023-09-30 ENCOUNTER — Inpatient Hospital Stay (HOSPITAL_BASED_OUTPATIENT_CLINIC_OR_DEPARTMENT_OTHER): Payer: Self-pay | Admitting: Family Medicine

## 2023-09-30 NOTE — Telephone Encounter (Signed)
 Transition Care Management Unsuccessful Follow-up Telephone Call  Date of discharge and from where:  09/27/23 from [facility]  Attempts:  2nd Attempt  Reason for unsuccessful TCM follow-up call:  Left voice message

## 2023-10-03 NOTE — Telephone Encounter (Signed)
 Transition Care Management Unsuccessful Follow-up Telephone Call  Date of discharge and from where:  09/27/23 from [facility]   Attempts:  3rd Attempt  Reason for unsuccessful TCM follow-up call:  Left voice message

## 2023-10-06 NOTE — Progress Notes (Deleted)
 Cardiology Office Note:    Date:  10/06/2023   ID:  Peter Bell, DOB 09-28-1970, MRN [id number]  PCP:  Nonda Bays, FNP   [facility] Providers Cardiologist:  Gaylyn Keas, MD { Click to update primary MD,subspecialty MD or APP then REFRESH:1}    Referring MD: Nonda Bays, *   No chief complaint on file. ***  History of Present Illness:    Peter Bell is a 53 y.o. male with a hx of CAD, hypertension, DM2, and hyperlipidemia.  He was recently admitted 09/25/2023.  Troponin elevated to 16k and ST depression on EKG.  LHC showed severe two-vessel disease in the LAD, OM, and LCx.  These were successfully treated with DES-LAD with 3.0 x 16 mm, DES-OM with 2.5 x 20 mm, and DES-LCX with 2.75 x 8 mm stent. Echocardiogram demonstrated LVEF 55-60%, RWMA, grade 1 DD, normal RV size and function, and no significant valvular disease.  He was discharged on DAPT with ASA and effient , 80 mg lipitor, 6.25 mg coreg  BID, 10 mg amlodipine , 25 mg losartan , and 10 mg jardiance .   He presents for cardiology follow up.     CAD NSTEMI 09/2023 DES-LAD, DES-OM, DES-LCX - continue DAPT with ASA and effient  x 12 months - continue BB and statin   Hypertension - continue coreg , losartan , and amlodipine  as above   Hyperlipidemia with LDL goal < 70 09/26/2023: Cholesterol 156; HDL 32; LDL Cholesterol 113; Triglycerides 54; VLDL 11 - on 80 mg lipitor - recheck in 4 weeks with LFTs - LPA 200 - likely will need lipid clinic referral   DM with hyperglycemia - A1c 8.8%       Past Medical History:  Diagnosis Date   Asthma    Diabetes mellitus without complication (HCC)    Eczema    GERD (gastroesophageal reflux disease)    Hypertension    Sciatica     Past Surgical History:  Procedure Laterality Date   CORONARY STENT INTERVENTION N/A 09/26/2023   Procedure: CORONARY STENT INTERVENTION;  Surgeon: Swaziland, [name], MD;  Location: [facility] INVASIVE CV LAB;  Service:  Cardiovascular;  Laterality: N/A;   HERNIA REPAIR     LEFT HEART CATH AND CORONARY ANGIOGRAPHY N/A 09/26/2023   Procedure: LEFT HEART CATH AND CORONARY ANGIOGRAPHY;  Surgeon: Swaziland, [name], MD;  Location: [facility] INVASIVE CV LAB;  Service: Cardiovascular;  Laterality: N/A;    Current Medications: No outpatient medications have been marked as taking for the 10/10/23 encounter (Appointment) with Lamond Pilot, PA.     Allergies:   Patient has no known allergies.   Social History   Socioeconomic History   Marital status: Divorced    Spouse name: Not on file   Number of children: Not on file   Years of education: Not on file   Highest education level: Not on file  Occupational History   Not on file  Tobacco Use   Smoking status: Never   Smokeless tobacco: Never  Vaping Use   Vaping status: Never Used  Substance and Sexual Activity   Alcohol use: Not Currently   Drug use: No   Sexual activity: Not on file  Other Topics Concern   Not on file  Social History Narrative   Not on file   Social Drivers of Health   Financial Resource Strain: High Risk (04/28/2023)   Overall Financial Resource Strain (CARDIA)    Difficulty of Paying Living Expenses: Very hard  Food Insecurity: No Food Insecurity (  09/26/2023)   Hunger Vital Sign    Worried About Running Out of Food in the Last Year: Never true    Ran Out of Food in the Last Year: Never true  Transportation Needs: No Transportation Needs (09/26/2023)   PRAPARE - Administrator, Civil Service (Medical): No    Lack of Transportation (Non-Medical): No  Physical Activity: Inactive (04/28/2023)   Exercise Vital Sign    Days of Exercise per Week: 0 days    Minutes of Exercise per Session: 0 min  Stress: No Stress Concern Present (04/28/2023)   [employer] of Occupational Health - Occupational Stress Questionnaire    Feeling of Stress : Only a little  Social Connections: Socially Isolated (04/28/2023)   Social Connection and  Isolation Panel    Frequency of Communication with Friends and Family: More than three times a week    Frequency of Social Gatherings with Friends and Family: Never    Attends Religious Services: Never    Database administrator or Organizations: No    Attends Engineer, structural: Never    Marital Status: Divorced     Family History: The patient's ***family history includes CVA in his brother and brother; Cancer in his father and mother; Heart murmur in his sister; Hypertension in his brother and sister.  ROS:   Please see the history of present illness.    *** All other systems reviewed and are negative.  EKGs/Labs/Other Studies Reviewed:    The following studies were reviewed today: ***      Recent Labs: 09/25/2023: ALT 13 09/27/2023: BUN 10; Creatinine, Ser 1.02; Hemoglobin 12.9; Platelets 252; Potassium 3.6; Sodium 136  Recent Lipid Panel    Component Value Date/Time   CHOL 156 09/26/2023 0040   CHOL 108 03/31/2023 1001   TRIG 54 09/26/2023 0040   HDL 32 (L) 09/26/2023 0040   HDL 29 (L) 03/31/2023 1001   CHOLHDL 4.9 09/26/2023 0040   VLDL 11 09/26/2023 0040   LDLCALC 113 (H) 09/26/2023 0040   LDLCALC 66 03/31/2023 1001     Risk Assessment/Calculations:   {Does this patient have ATRIAL FIBRILLATION?:[id number]}  No BP recorded.  {Refresh Note OR Click here to enter BP  :1}***         Physical Exam:    VS:  There were no vitals taken for this visit.    Wt Readings from Last 3 Encounters:  09/26/23 137 lb 3.2 oz (62.2 kg)  04/28/23 143 lb 3.2 oz (65 kg)  03/31/23 137 lb 4.8 oz (62.3 kg)     GEN: *** Well nourished, well developed in no acute distress HEENT: Normal NECK: No JVD; No carotid bruits LYMPHATICS: No lymphadenopathy CARDIAC: ***RRR, no murmurs, rubs, gallops RESPIRATORY:  Clear to auscultation without rales, wheezing or rhonchi  ABDOMEN: Soft, non-tender, non-distended MUSCULOSKELETAL:  No edema; No deformity  SKIN: Warm and  dry NEUROLOGIC:  Alert and oriented x 3 PSYCHIATRIC:  Normal affect   ASSESSMENT:    No diagnosis found. PLAN:    In order of problems listed above:  ***  {The patient has an active order for outpatient cardiac rehabilitation.   Please indicate if the patient is ready to start. Do NOT delete this.  It will auto delete.  Refresh note, then sign.              Click here to document readiness and see contraindications.  :1}  Cardiac Rehabilitation Eligibility Assessment       {  Are you ordering a CV Procedure (e.g. stress test, cath, DCCV, TEE, etc)?   Press F2        :[id number]}    Medication Adjustments/Labs and Tests Ordered: Current medicines are reviewed at length with the patient today.  Concerns regarding medicines are outlined above.  No orders of the defined types were placed in this encounter.  No orders of the defined types were placed in this encounter.   There are no Patient Instructions on file for this visit.   Signed, Lamond Pilot, [place]  10/06/2023 7:49 AM    [facility]

## 2023-10-10 ENCOUNTER — Ambulatory Visit: Payer: MEDICAID | Admitting: Physician Assistant

## 2023-10-20 NOTE — Progress Notes (Signed)
 Cardiology Office Note:    Date:  10/21/2023   ID:  Peter Bell, DOB [birth year], MRN [id number]  PCP:  Peter Thersia Bitters, FNP   [facility] Providers Cardiologist:  Wilbert Bihari, MD { Referring MD: Peter Bell, *   Chief Complaint  Patient presents with   Follow-up    Post cath    History of Present Illness:    Peter Bell is a 53 y.o. male with a hx of CAD, hypertension, DM2, and hyperlipidemia.  He was recently admitted 09/25/2023.  Troponin elevated to 16k and ST depression on EKG.  LHC showed severe two-vessel disease in the LAD, OM, and LCx.  These were successfully treated with DES-LAD with 3.0 x 16 mm, DES-OM with 2.5 x 20 mm, and DES-LCX with 2.75 x 8 mm stent. Echocardiogram demonstrated LVEF 55-60%, RWMA, grade 1 DD, normal RV size and function, and no significant valvular disease.  He was discharged on DAPT with ASA and effient , 80 mg lipitor, 6.25 mg coreg  BID, 10 mg amlodipine , 25 mg losartan , and 10 mg jardiance .   He presents for cardiology follow up. 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 He is self pay and on effient . No chest pain. He is only taking all medications once per day - including coreg . He is not taking insulin , still listed on his med list but was not discharged on this.   I help him go through his medications in his bag.  I do not see Effient .  He states he is taking all medications that he was given except taking them once a day.  Given uncertainty, I will switch Effient  to Plavix.  Per Pharm.D., do not need to load this.   Past Medical History:  Diagnosis Date   Asthma    Diabetes mellitus without complication (HCC)    Eczema    GERD (gastroesophageal reflux disease)    Hypertension    Sciatica     Past Surgical History:  Procedure Laterality Date   CORONARY STENT INTERVENTION N/A 09/26/2023   Procedure: CORONARY STENT INTERVENTION;  Surgeon: Swaziland, [name], MD;  Location[facility] INVASIVE CV  LAB;  Service: Cardiovascular;  Laterality: N/A;   HERNIA REPAIR     LEFT HEART CATH AND CORONARY ANGIOGRAPHY N/A 09/26/2023   Procedure: LEFT HEART CATH AND CORONARY ANGIOGRAPHY;  Surgeon: Swaziland, [name], MD;  Location: [facility] INVASIVE CV LAB;  Service: Cardiovascular;  Laterality: N/A;    Current Medications: Current Meds  Medication Sig   amLODipine  (NORVASC ) 10 MG tablet Take 1 tablet (10 mg total) by mouth daily.   aspirin  EC 81 MG tablet Take 1 tablet (81 mg total) by mouth daily. Swallow whole.   atorvastatin  (LIPITOR) 80 MG tablet Take 1 tablet (80 mg total) by mouth daily.   empagliflozin  (JARDIANCE ) 10 MG TABS tablet Take 1 tablet (10 mg total) by mouth daily.   metFORMIN  (GLUCOPHAGE ) 500 MG tablet Take 1 tablet (500 mg total) by mouth 2 (two) times daily with a meal.   pantoprazole  (PROTONIX ) 20 MG tablet Take 1 tablet (20 mg total) by mouth daily.   [DISCONTINUED] carvedilol  (COREG ) 6.25 MG tablet Take 1 tablet (6.25 mg total) by mouth 2 (two) times daily.   [DISCONTINUED] clopidogrel (PLAVIX) 75 MG tablet Take 1 tablet (75 mg total) by mouth daily.   [DISCONTINUED] losartan  (COZAAR ) 25 MG tablet Take 1 tablet (25 mg total) by mouth daily.   [DISCONTINUED] losartan  (COZAAR ) 50 MG  tablet Take 1 tablet (50 mg total) by mouth daily.   [DISCONTINUED] metoprolol succinate (TOPROL XL) 25 MG 24 hr tablet Take 1 tablet (25 mg total) by mouth daily.   [DISCONTINUED] prasugrel  (EFFIENT ) 10 MG TABS tablet Take 1 tablet (10 mg total) by mouth daily.     Allergies:   Patient has no known allergies.   Social History   Socioeconomic History   Marital status: Divorced    Spouse name: Not on file   Number of children: Not on file   Years of education: Not on file   Highest education level: Not on file  Occupational History   Not on file  Tobacco Use   Smoking status: Never   Smokeless tobacco: Never  Vaping Use   Vaping status: Never Used  Substance and Sexual Activity   Alcohol use:  Not Currently   Drug use: No   Sexual activity: Not on file  Other Topics Concern   Not on file  Social History Narrative   Not on file   Social Drivers of Health   Financial Resource Strain: High Risk (04/28/2023)   Overall Financial Resource Strain (CARDIA)    Difficulty of Paying Living Expenses: Very hard  Food Insecurity: No Food Insecurity (09/26/2023)   Hunger Vital Sign    Worried About Running Out of Food in the Last Year: Never true    Ran Out of Food in the Last Year: Never true  Transportation Needs: No Transportation Needs (09/26/2023)   PRAPARE - Administrator, Civil Service (Medical): No    Lack of Transportation (Non-Medical): No  Physical Activity: Inactive (04/28/2023)   Exercise Vital Sign    Days of Exercise per Week: 0 days    Minutes of Exercise per Session: 0 min  Stress: No Stress Concern Present (04/28/2023)   [employer] of Occupational Health - Occupational Stress Questionnaire    Feeling of Stress : Only a little  Social Connections: Socially Isolated (04/28/2023)   Social Connection and Isolation Panel    Frequency of Communication with Friends and Family: More than three times a week    Frequency of Social Gatherings with Friends and Family: Never    Attends Religious Services: Never    Database administrator or Organizations: No    Attends Engineer, structural: Never    Marital Status: Divorced     Family History: The patient's family history includes CVA in his brother and brother; Cancer in his father and mother; Heart murmur in his sister; Hypertension in his brother and sister.  ROS:   Please see the history of present illness.     All other systems reviewed and are negative.  EKGs/Labs/Other Studies Reviewed:    The following studies were reviewed today:  EKG Interpretation Date/Time:  Friday October 21 2023 13:25:10 EDT Ventricular Rate:  71 PR Interval:  156 QRS Duration:  94 QT Interval:  394 QTC  Calculation: 428 R Axis:   -31  Text Interpretation: Normal sinus rhythm Left axis deviation Moderate voltage criteria for LVH, may be normal variant ( R in aVL , Cornell product ) Nonspecific T wave abnormality When compared with ECG of 27-Sep-2023 08:36, T wave inversion now evident in Inferior leads Nonspecific T wave abnormality has replaced inverted T waves in Lateral leads Confirmed by Madie Slough ([id number]) on 10/21/2023 1:37:39 PM    Recent Labs: 09/25/2023: ALT 13 09/27/2023: BUN 10; Creatinine, Ser 1.02; Hemoglobin 12.9; Platelets 252; Potassium 3.6; Sodium  136  Recent Lipid Panel    Component Value Date/Time   CHOL 156 09/26/2023 0040   CHOL 108 03/31/2023 1001   TRIG 54 09/26/2023 0040   HDL 32 (L) 09/26/2023 0040   HDL 29 (L) 03/31/2023 1001   CHOLHDL 4.9 09/26/2023 0040   VLDL 11 09/26/2023 0040   LDLCALC 113 (H) 09/26/2023 0040   LDLCALC 66 03/31/2023 1001     Risk Assessment/Calculations:                Physical Exam:    VS:  BP 120/62 (BP Location: Left Arm, Patient Position: Sitting)   Pulse 71   Ht 5' 5 (1.651 m)   Wt 131 lb (59.4 kg)   SpO2 98%   BMI 21.80 kg/m     Wt Readings from Last 3 Encounters:  10/21/23 131 lb (59.4 kg)  09/26/23 137 lb 3.2 oz (62.2 kg)  04/28/23 143 lb 3.2 oz (65 kg)     GEN:  Well nourished, well developed in no acute distress HEENT: Normal NECK: No JVD; No carotid bruits LYMPHATICS: No lymphadenopathy CARDIAC: RRR, no murmurs, rubs, gallops RESPIRATORY:  Clear to auscultation without rales, wheezing or rhonchi  ABDOMEN: Soft, non-tender, non-distended MUSCULOSKELETAL:  No edema; No deformity  SKIN: Warm and dry NEUROLOGIC:  Alert and oriented x 3 PSYCHIATRIC:  Normal affect  Right radial C/D/I  ASSESSMENT:    1. CAD S/P percutaneous coronary angioplasty   2. NSTEMI (non-ST elevated myocardial infarction) (HCC)   3. Essential hypertension, benign   4. Hyperlipidemia with target LDL less than 70   5. Aortic  atherosclerosis (HCC)   6. Type 2 diabetes mellitus with hyperglycemia, without long-term current use of insulin  (HCC)    PLAN:    In order of problems listed above:  CAD NSTEMI 09/2023 DES-LAD, DES-OM, DES-LCX - continue DAPT with DAPT x 12 months - continue BB and statin -Will switch Effient  to 75 mg Plavix daily without load   Hypertension - continue amlodipine  as above - change coreg  to 25 mg toprol - increase losartan  to 50 mg -Will need BMP in 1 week   Hyperlipidemia with LDL goal < 70 09/26/2023: Cholesterol 156; HDL 32; LDL Cholesterol 113; Triglycerides 54; VLDL 11 - on 80 mg lipitor - recheck in 4 weeks with LFTs - LPA 200 - will need lipid clinic referral   DM with hyperglycemia - A1c 8.8% - not on insulin  - continue metformin  -Will provide patient assistance for Jardiance    I have reached out to pharmD for help - he is self-pay and on jardiance  and effient .   Will refer to pharmD for lipid clinic given elevated LPA.   He was not interested in patient assistance.  Follow up in 1 month.   PT is ready to start cardiac rehab.        Medication Adjustments/Labs and Tests Ordered: Current medicines are reviewed at length with the patient today.  Concerns regarding medicines are outlined above.  Orders Placed This Encounter  Procedures   Basic metabolic panel with GFR   Referral to HRT/VAS Care Navigation   AMB Referral to Advanced Lipid Disorders Clinic   EKG 12-Lead   Meds ordered this encounter  Medications   DISCONTD: clopidogrel (PLAVIX) 75 MG tablet    Sig: Take 1 tablet (75 mg total) by mouth daily.    Dispense:  90 tablet    Refill:  3   DISCONTD: metoprolol succinate (TOPROL XL) 25 MG 24 hr tablet  Sig: Take 1 tablet (25 mg total) by mouth daily.    Dispense:  90 tablet    Refill:  3   DISCONTD: losartan  (COZAAR ) 50 MG tablet    Sig: Take 1 tablet (50 mg total) by mouth daily.    Dispense:  90 tablet    Refill:  3   clopidogrel  (PLAVIX) 75 MG tablet    Sig: Take 1 tablet (75 mg total) by mouth daily.    Dispense:  90 tablet    Refill:  3   metoprolol succinate (TOPROL XL) 25 MG 24 hr tablet    Sig: Take 1 tablet (25 mg total) by mouth daily.    Dispense:  90 tablet    Refill:  3   losartan  (COZAAR ) 50 MG tablet    Sig: Take 1 tablet (50 mg total) by mouth daily.    Dispense:  90 tablet    Refill:  3    Patient Instructions  Medication Instructions:  DISCONTINUE Carvedlol. DO not take this medication.  STOP taking Effient   START Metoprolol Succinate (Toprol) 25mg . Take this mediation once daily.  INCREASE the Losartan  to 50mg  daily.   START Plavix 75mg  TONIGHT!!!!!!!!!!!!  *If you need a refill on your cardiac medications before your next appointment, please call your pharmacy*   Lab Work: Return for labs IN ONE WEEK.................BMET If you have labs (blood work) drawn today and your tests are completely normal, you will receive your results only by: MyChart Message (if you have MyChart) OR A paper copy in the mail If you have any lab test that is abnormal or we need to change your treatment, we will call you to review the results.   Testing/Procedures: No procedures were ordered during today's visit.    Follow-Up: At [facility], you and your health needs are our priority.  As part of our continuing mission to provide you with exceptional heart care, we have created designated Provider Care Teams.  These Care Teams include your primary Cardiologist (physician) and Advanced Practice Providers (APPs -  Physician Assistants and Nurse Practitioners) who all work together to provide you with the care you need, when you need it.  We recommend signing up for the patient portal called MyChart.  Sign up information is provided on this After Visit Summary.  MyChart is used to connect with patients for Virtual Visits (Telemedicine).  Patients are able to view lab/test results, encounter  notes, upcoming appointments, etc.  Non-urgent messages can be sent to your provider as well.   To learn more about what you can do with MyChart, go to [email or web address].    Your next appointment:   2 week(s)  Provider:   Jon Hails, PA-C          Other Instructions Thank you for choosing [facility]!       Signed, Jon Nat Hails, PA  10/21/2023 2:40 PM    [facility]

## 2023-10-21 ENCOUNTER — Ambulatory Visit: Payer: MEDICAID | Attending: Physician Assistant | Admitting: Physician Assistant

## 2023-10-21 ENCOUNTER — Encounter: Payer: Self-pay | Admitting: Physician Assistant

## 2023-10-21 ENCOUNTER — Telehealth: Payer: Self-pay | Admitting: Pharmacy Technician

## 2023-10-21 ENCOUNTER — Other Ambulatory Visit (HOSPITAL_COMMUNITY): Payer: Self-pay

## 2023-10-21 VITALS — BP 120/62 | HR 71 | Ht 65.0 in | Wt 131.0 lb

## 2023-10-21 DIAGNOSIS — I10 Essential (primary) hypertension: Secondary | ICD-10-CM

## 2023-10-21 DIAGNOSIS — I214 Non-ST elevation (NSTEMI) myocardial infarction: Secondary | ICD-10-CM

## 2023-10-21 DIAGNOSIS — Z9861 Coronary angioplasty status: Secondary | ICD-10-CM

## 2023-10-21 DIAGNOSIS — I700 Atherosclerosis of aorta: Secondary | ICD-10-CM

## 2023-10-21 DIAGNOSIS — I2510 Atherosclerotic heart disease of native coronary artery without angina pectoris: Secondary | ICD-10-CM

## 2023-10-21 DIAGNOSIS — E1165 Type 2 diabetes mellitus with hyperglycemia: Secondary | ICD-10-CM

## 2023-10-21 DIAGNOSIS — E785 Hyperlipidemia, unspecified: Secondary | ICD-10-CM

## 2023-10-21 MED ORDER — METOPROLOL SUCCINATE ER 25 MG PO TB24
25.0000 mg | ORAL_TABLET | Freq: Every day | ORAL | 3 refills | Status: AC
  Filled 2023-10-21 (×2): qty 90, 90d supply, fill #0

## 2023-10-21 MED ORDER — CLOPIDOGREL BISULFATE 75 MG PO TABS: 75.0000 mg | ORAL_TABLET | Freq: Every day | ORAL | 3 refills | Status: DC

## 2023-10-21 MED ORDER — CLOPIDOGREL BISULFATE 75 MG PO TABS
75.0000 mg | ORAL_TABLET | Freq: Every day | ORAL | 3 refills | Status: AC
  Filled 2023-10-21: qty 90, 90d supply, fill #0

## 2023-10-21 MED ORDER — LOSARTAN POTASSIUM 50 MG PO TABS: 50.0000 mg | ORAL_TABLET | Freq: Every day | ORAL | 3 refills | Status: DC

## 2023-10-21 MED ORDER — METOPROLOL SUCCINATE ER 25 MG PO TB24: 25.0000 mg | ORAL_TABLET | Freq: Every day | ORAL | 3 refills | Status: DC

## 2023-10-21 MED ORDER — LOSARTAN POTASSIUM 50 MG PO TABS
50.0000 mg | ORAL_TABLET | Freq: Every day | ORAL | 3 refills | Status: DC
  Filled 2023-10-21: qty 90, 90d supply, fill #0

## 2023-10-21 NOTE — Telephone Encounter (Signed)
 PAP: Patient assistance application for Jardiance  through [employer]) has been mailed to pt's home address on file. Provider portion of application will be faxed to provider's office.    Got some of patient portion, waiting on provider portion. Lmom for patient to let me know how many people in his home and income.

## 2023-10-21 NOTE — Patient Instructions (Addendum)
 Medication Instructions:  DISCONTINUE Carvedlol. DO not take this medication.  STOP taking Effient   START Metoprolol Succinate (Toprol) 25mg . Take this mediation once daily.  INCREASE the Losartan  to 50mg  daily.   START Plavix 75mg  TONIGHT!!!!!!!!!!!!  *If you need a refill on your cardiac medications before your next appointment, please call your pharmacy*   Lab Work: Return for labs IN ONE WEEK.................BMET If you have labs (blood work) drawn today and your tests are completely normal, you will receive your results only by: MyChart Message (if you have MyChart) OR A paper copy in the mail If you have any lab test that is abnormal or we need to change your treatment, we will call you to review the results.   Testing/Procedures: No procedures were ordered during today's visit.    Follow-Up: At [facility], you and your health needs are our priority.  As part of our continuing mission to provide you with exceptional heart care, we have created designated Provider Care Teams.  These Care Teams include your primary Cardiologist (physician) and Advanced Practice Providers (APPs -  Physician Assistants and Nurse Practitioners) who all work together to provide you with the care you need, when you need it.  We recommend signing up for the patient portal called MyChart.  Sign up information is provided on this After Visit Summary.  MyChart is used to connect with patients for Virtual Visits (Telemedicine).  Patients are able to view lab/test results, encounter notes, upcoming appointments, etc.  Non-urgent messages can be sent to your provider as well.   To learn more about what you can do with MyChart, go to [email or web address].    Your next appointment:   2 week(s)  Provider:   Jon Hails, PA-C          Other Instructions Thank you for choosing [facility]!

## 2023-10-21 NOTE — Progress Notes (Signed)
 Heart and Vascular Care Navigation  10/21/2023  Peter Bell 03-31-1971 [id number]  Reason for Referral: self pay Patient is participating in a Managed Medicaid Plan: No, self pay  Engaged with patient face to face for initial visit for Heart and Vascular Care Coordination.                                                                                                   Assessment:         LCSW was able to meet with pt in clinic today. Introduced self, role, reason for visit. Provided pt with my card, pt confirmed home address, emergency contact and contact number. Resides with others, works part time and has had a pending disability application for two years- is currently working with legal assistance for this. Pt shares he has challenges with medication costs consistently and other ongoing costs of living. Has access to transportation. Pt is not sure if anyone called him from [employer]- we attempted to review his calls but were unable to get to date of message. Shared I would reach out to financial counselor and see if she is able to call him/allow me to provide a good number for him to call.   Pt will need to identify and provide income for medication assistance and Medicaid. Encouraged him to work on getting that information. Discussed getting medications at affordable cost from [facility], Jardiance  application pending multiple pieces of information.                               Will f/u to provide additional resources and financial counseling information. If not Medicaid eligible we can discuss additional program assistance.   HRT/VAS Care Coordination     Patients Home Cardiology Office --  [facility]   Outpatient Care Team Social Worker   Social Worker Name: Marit Lark, [place], [phone]   Living arrangements for the past 2 months Apartment   Lives with: Roommate   Patient Current Insurance Coverage Self-Pay   Patient Has Concern With Paying Medical Bills Yes    Patient Concerns With Medical Bills self pay, minimal income, disability pending   Medical Bill Referrals: [employer] financial counseling;  CAFA if ineligible for Medicaid   Does Patient Have Prescription Coverage? No   Patient Prescription Assistance Programs Patient Assistance Programs   DME Agency NA       Social History:                                                                             SDOH Screenings   Food Insecurity: Food Insecurity Present (10/21/2023)  Housing: Low Risk  (10/21/2023)  Transportation Needs: No Transportation Needs (10/21/2023)  Utilities: Not At Risk (10/21/2023)  Alcohol Screen:  Low Risk  (04/28/2023)  Depression (PHQ2-9): Low Risk  (04/28/2023)  Financial Resource Strain: High Risk (10/21/2023)  Physical Activity: Inactive (04/28/2023)  Social Connections: Socially Isolated (04/28/2023)  Stress: No Stress Concern Present (04/28/2023)  Tobacco Use: Low Risk  (10/21/2023)  Health Literacy: Adequate Health Literacy (10/21/2023)    SDOH Interventions: Financial Resources:  Surveyor, quantity Strain Interventions: Programmer, applications Provided, Artist DSS for financial assistance and Editor, commissioning for [employer]  Food Insecurity:  Food Insecurity Interventions: Other (Comment) (pt receives SNAP at this time)  Housing Insecurity:  Housing Interventions: Intervention Not Indicated  Transportation:   Transportation Interventions: Sports administrator Given    Other Care Navigation Interventions:     Provided Pharmacy assistance resources Patient Assistance Programs; AR account at [employer]   Follow-up plan:   LCSW sent message to [employer] counselor [place] to complete pt referral/obtain a good number for him. Pt was able to go down stairs and get medications with AR account assistance. Will f/u once I hear back from [place], Artist. If not eligible for assistance with Medicaid then can assist with CAFA and [employer]  applications.

## 2023-10-21 NOTE — Telephone Encounter (Signed)
 Faxed application to [employer]. Missing proof of income and oop. He does not have ins

## 2023-10-21 NOTE — Telephone Encounter (Signed)
 Received provider form. Waitign on patient income and housesize

## 2023-10-24 ENCOUNTER — Telehealth (HOSPITAL_BASED_OUTPATIENT_CLINIC_OR_DEPARTMENT_OTHER): Payer: Self-pay | Admitting: Licensed Clinical Social Worker

## 2023-10-24 NOTE — Telephone Encounter (Signed)
 H&V Care Navigation CSW Progress Note  Clinical Social Worker contacted patient by phone to f/u on referral to [employer]- no answer at [phone]. Left voicemail with information from [place], her callback number is [phone]. Also texted pt as he shared during appt that would be helpful due to large volume of calls. LCSW received reply back from pt via text that he received the number. I will also mail information about food assistance, rent/utility assistance and Medicaid/[facility] Financial Assistance application if not Medicaid eligible.  Patient is participating in a Managed Medicaid Plan:  No, self pay only, may be Medicaid eligibility  SDOH Screenings   Food Insecurity: Food Insecurity Present (10/24/2023)  Housing: High Risk (10/24/2023)  Transportation Needs: No Transportation Needs (10/24/2023)  Utilities: Not At Risk (10/21/2023)  Alcohol Screen: Low Risk  (10/24/2023)  Depression (PHQ2-9): Low Risk  (04/28/2023)  Financial Resource Strain: Medium Risk (10/24/2023)  Physical Activity: Inactive (10/24/2023)  Social Connections: Unknown (10/24/2023)  Stress: No Stress Concern Present (10/24/2023)  Tobacco Use: Low Risk  (10/21/2023)  Health Literacy: Adequate Health Literacy (10/21/2023)    Marit Lark, MSW, LCSW Clinical Social Worker II [facility] Heart/Vascular Care Navigation  [phone]- work cell phone (preferred)

## 2023-10-25 ENCOUNTER — Telehealth (HOSPITAL_BASED_OUTPATIENT_CLINIC_OR_DEPARTMENT_OTHER): Payer: Self-pay | Admitting: *Deleted

## 2023-10-25 ENCOUNTER — Other Ambulatory Visit: Payer: Self-pay

## 2023-10-25 NOTE — Telephone Encounter (Signed)
 Copied from CRM [id number]. Topic: Appointments - Scheduling Inquiry for Clinic >> Oct 25, 2023 11:41 AM Tobias CROME wrote: Reason for CRM: Patient inquiring if someone cancels in the afternoon tomorrow if he could get a call to take afternoon appointment.   Pt states mychart or phone call would work but would prefer to be seen tomorrow afternoon

## 2023-10-25 NOTE — Telephone Encounter (Signed)
 Approved 10/24/23- 10/23/24  I called the patient and he is aware of the approval   He said he has 5 or 6 tablets left. He said he will call back before he runs out if he doesn't receive shipment from [employer] before then

## 2023-10-26 ENCOUNTER — Other Ambulatory Visit (HOSPITAL_COMMUNITY): Payer: Self-pay

## 2023-10-26 ENCOUNTER — Ambulatory Visit (HOSPITAL_BASED_OUTPATIENT_CLINIC_OR_DEPARTMENT_OTHER): Payer: Self-pay | Admitting: Family Medicine

## 2023-10-26 ENCOUNTER — Ambulatory Visit (HOSPITAL_BASED_OUTPATIENT_CLINIC_OR_DEPARTMENT_OTHER): Payer: Commercial Managed Care - HMO | Admitting: Family Medicine

## 2023-10-26 ENCOUNTER — Telehealth (HOSPITAL_BASED_OUTPATIENT_CLINIC_OR_DEPARTMENT_OTHER): Payer: Self-pay | Admitting: Family Medicine

## 2023-10-26 ENCOUNTER — Encounter (HOSPITAL_BASED_OUTPATIENT_CLINIC_OR_DEPARTMENT_OTHER): Payer: Self-pay | Admitting: Family Medicine

## 2023-10-26 VITALS — BP 128/86 | HR 73 | Ht 65.0 in | Wt 132.0 lb

## 2023-10-26 DIAGNOSIS — Z0000 Encounter for general adult medical examination without abnormal findings: Secondary | ICD-10-CM

## 2023-10-26 DIAGNOSIS — Z1211 Encounter for screening for malignant neoplasm of colon: Secondary | ICD-10-CM

## 2023-10-26 DIAGNOSIS — G8929 Other chronic pain: Secondary | ICD-10-CM

## 2023-10-26 DIAGNOSIS — I214 Non-ST elevation (NSTEMI) myocardial infarction: Secondary | ICD-10-CM

## 2023-10-26 DIAGNOSIS — M545 Low back pain, unspecified: Secondary | ICD-10-CM

## 2023-10-26 DIAGNOSIS — E1165 Type 2 diabetes mellitus with hyperglycemia: Secondary | ICD-10-CM

## 2023-10-26 DIAGNOSIS — I10 Essential (primary) hypertension: Secondary | ICD-10-CM

## 2023-10-26 MED ORDER — PANTOPRAZOLE SODIUM 20 MG PO TBEC
20.0000 mg | DELAYED_RELEASE_TABLET | Freq: Every day | ORAL | 11 refills | Status: DC
  Filled 2023-10-26: qty 30, 30d supply, fill #0

## 2023-10-26 MED ORDER — METFORMIN HCL 500 MG PO TABS: 1000.0000 mg | ORAL_TABLET | Freq: Every day | ORAL | Status: DC

## 2023-10-26 NOTE — Patient Instructions (Addendum)
 [facility]: you are established with their office. Please call to schedule an appt  [facility] Physical Medicine & Rehabilitation [address] [place],  [place]  [address] Main: [phone]    [facility] at [facility] No reviews  Cardiologist [address] 5th Floor  [phone]   Ophthalmology Offices   [facility]  [address]  [place], [place] [address] [phone]  [facility] Ophthalmology [address] [place], [place] [address] Phone: [phone]  [facility] [address] [place], [place] [address] Phone: [phone]  [facility]  Optometrist [address]  [phone]  [facility] Optometrist [address]  [phone]

## 2023-10-26 NOTE — Progress Notes (Signed)
 Subjective:   Peter Bell [birth year] 10/26/2023  Chief Complaint  Patient presents with   Hospitalization Follow-up    Patient was recently in the hospital after having a NSTEMI. States since being out of the hospital, he has been doing better. Denies any main concerns for today.    HPI: Peter Bell presents today for re-assessment and management of chronic medical conditions.  NSTEMI HOSPITAL FOLLOW UP:  Patient is status post percutaneous coronary angioplasty with stent placement after being admitted to [facility] on 09/25/2023 with troponin elevation and ST depression.  Patient underwent drug-eluting stent placement due to severe vessel disease in the LAD, OM, left CX.  He was discharged and has since followed up with cardiology on 10/21/2023.  He is currently being enrolled in cardiac rehab.  He states he is now compliant with medications and taking as directed.  He was switched to Plavix  from Effient  due to cost and has been consulted with social work and pharmacy to receive patient assistance for his medications.  He follows up with cardiology in 1 month.  He is currently taking amlodipine  10 mg, aspirin  81 mg, atorvastatin  80 mg, Plavix  75 mg, Jardiance  10 mg, losartan  50 mg, and metoprolol  25 mg XR daily.  He denies recurrence of chest pain or shortness of breath at this time.  DIABETES MELLITUS: Peter Bell presents for the medical management of diabetes.  Current diabetes medication regimen: Metformin  500 mg BID (patient taking only 1 tab/day), Jardiance  10mg .  Patient is  adhering to a diabetic diet.  Patient is not exercising regularly.  Patient is not checking BS regularly.  Patient is  checking their feet regularly.  Denies polydipsia, polyphagia, polyuria, open wounds or ulcers on feet.  Lab Results  Component Value Date   HGBA1C 8.8 (H) 09/26/2023    Foot Exam: 03/31/2023 Lab Results  Component Value Date   MICROALBUR 30 04/28/2023     Wt Readings from Last 3 Encounters:  10/26/23 132 lb (59.9 kg)  10/21/23 131 lb (59.4 kg)  09/26/23 137 lb 3.2 oz (62.2 kg)    CHRONIC BACK PAIN:  Patient is requesting referral for back pain provider due to chronic pain and impairment with function. He is working through disability clam per patient. He has been seen by Physiatry and was recommended for lumbar imaging and possible spinal injections for pain relief. He has not followed up since March 2025 to discuss options.   The following portions of the patient's history were reviewed and updated as appropriate: past medical history, past surgical history, family history, social history, allergies, medications, and problem list.   Patient Active Problem List   Diagnosis Date Noted   Chronic low back pain 10/26/2023   NSTEMI (non-ST elevated myocardial infarction) (HCC) 09/25/2023   Impaired gait 03/31/2023   Type 2 diabetes mellitus with hyperglycemia, without long-term current use of insulin  (HCC) 03/31/2023   Neuropathic pain 03/31/2023   Spinal stenosis of lumbar region 04/22/2020   Acute pain of left knee 09/17/2019   Protrusion of lumbar intervertebral disc 05/29/2019   SOB (shortness of breath) 05/10/2013   Essential hypertension, benign 04/24/2013   Chest pain, unspecified 04/24/2013   Past Medical History:  Diagnosis Date   Asthma    Diabetes mellitus without complication (HCC)    Eczema    GERD (gastroesophageal reflux disease)    Hypertension    Myocardial infarction [facility])    Sciatica    Past Surgical History:  Procedure Laterality Date   CORONARY STENT INTERVENTION N/A 09/26/2023   Procedure: CORONARY STENT INTERVENTION;  Surgeon: Swaziland, [name], MD;  Location: [facility] INVASIVE CV LAB;  Service: Cardiovascular;  Laterality: N/A;   HERNIA REPAIR     LEFT HEART CATH AND CORONARY ANGIOGRAPHY N/A 09/26/2023   Procedure: LEFT HEART CATH AND CORONARY ANGIOGRAPHY;  Surgeon: Swaziland, [name], MD;  Location: [facility] INVASIVE CV  LAB;  Service: Cardiovascular;  Laterality: N/A;   Family History  Problem Relation Age of Onset   Cancer Mother    Cancer Father    Hypertension Sister    Heart murmur Sister    CVA Brother    CVA Brother    Hypertension Brother    Outpatient Medications Prior to Visit  Medication Sig Dispense Refill   amLODipine  (NORVASC ) 10 MG tablet Take 1 tablet (10 mg total) by mouth daily. 90 tablet 3   aspirin  EC 81 MG tablet Take 1 tablet (81 mg total) by mouth daily. Swallow whole. 90 tablet 3   atorvastatin  (LIPITOR) 80 MG tablet Take 1 tablet (80 mg total) by mouth daily. 90 tablet 3   clopidogrel  (PLAVIX ) 75 MG tablet Take 1 tablet (75 mg total) by mouth daily. 90 tablet 3   empagliflozin  (JARDIANCE ) 10 MG TABS tablet Take 1 tablet (10 mg total) by mouth daily. 30 tablet 11   losartan  (COZAAR ) 50 MG tablet Take 1 tablet (50 mg total) by mouth daily. 90 tablet 3   metoprolol  succinate (TOPROL  XL) 25 MG 24 hr tablet Take 1 tablet (25 mg total) by mouth daily. 90 tablet 3   nitroGLYCERIN  (NITROSTAT ) 0.4 MG SL tablet Place 1 tablet (0.4 mg total) under the tongue every 5 (five) minutes x 3 doses as needed for chest pain. 25 tablet 0   metFORMIN  (GLUCOPHAGE ) 500 MG tablet Take 1 tablet (500 mg total) by mouth 2 (two) times daily with a meal. 60 tablet 11   pantoprazole  (PROTONIX ) 20 MG tablet Take 1 tablet (20 mg total) by mouth daily. 14 tablet 0   No facility-administered medications prior to visit.   No Known Allergies   ROS: A complete ROS was performed with pertinent positives/negatives noted in the HPI. The remainder of the ROS are negative.    Objective:   Today's Vitals   10/26/23 1357 10/26/23 1400  BP: (!) 152/94 128/86  Pulse: 73   SpO2: 100%   Weight: 132 lb (59.9 kg)   Height: 5' 5 (1.651 m)     Physical Exam          GENERAL: Well-appearing, in NAD. Well nourished.  SKIN: Pink, warm and dry.  Head: Normocephalic. NECK: Trachea midline. Full ROM w/o pain or  tenderness.  RESPIRATORY: Chest wall symmetrical. Respirations even and non-labored. Breath sounds clear to auscultation bilaterally.  CARDIAC: S1, S2 present, regular rate and rhythm without murmur or gallops. Peripheral pulses 2+ bilaterally.  MSK: Muscle tone and strength appropriate for age.  NEUROLOGIC: No motor or sensory deficits. Steady, even gait. C2-C12 intact.  PSYCH/MENTAL STATUS: Alert, oriented x 3. Cooperative, appropriate mood and affect.   Health Maintenance Due  Topic Date Due   OPHTHALMOLOGY EXAM  Never done   Pneumococcal Vaccine 83-64 Years old (1 of 2 - PCV) Never done   Hepatitis B Vaccines (1 of 3 - 19+ 3-dose series) Never done   Colonoscopy  Never done   Zoster Vaccines- Shingrix (1 of 2) Never done   COVID-19 Vaccine (1 - 2024-25 season)  Never done      Assessment & Plan:  1. NSTEMI (non-ST elevated myocardial infarction) (HCC) (Primary) Patient to begin cardiac rehab program with Cardiology and follow up as scheduled. He understands importance of medication adherence and BP control.   2. Essential hypertension, benign Continue current regimen. BP well controlled. Discussed recommendations for monitoring, heart healthy diet and cardiac rehab program benefit.   3. Type 2 diabetes mellitus with hyperglycemia, without long-term current use of insulin  (HCC) Uncontrolled. Will check A1C with labs today. Recommend he continue with Jardiance  and Metformin  500mg  BID and recheck A1C in 3-4 months.   4. Chronic low back pain, unspecified back pain laterality, unspecified whether sciatica present Patient is established with Physiatry for chronic back pain and was recommended follow up per chart review to discuss possible spinal injections. He will reach out to schedule.   5. Healthcare maintenance Ophthalmology offices provided for patient to establish care for diabetic eye exam. Dental list also provided.   6. Screening for colon cancer - Ambulatory referral to  Gastroenterology  Meds ordered this encounter  Medications   metFORMIN  (GLUCOPHAGE ) 500 MG tablet    Sig: Take 2 tablets (1,000 mg total) by mouth daily. Take with meal.    Supervising Provider:   DE PERU, RAYMOND J [[id number]]   pantoprazole  (PROTONIX ) 20 MG tablet    Sig: Take 1 tablet (20 mg total) by mouth daily.    Dispense:  30 tablet    Refill:  11    Supervising Provider:   DE PERU, RAYMOND J [[id number]]   Lab Orders  No laboratory test(s) ordered today   No images are attached to the encounter or orders placed in the encounter.  Return in about 3 months (around 01/26/2024) for DIABETES CHECK UP.    Patient to reach out to office if new, worrisome, or unresolved symptoms arise or if no improvement in patient's condition. Patient verbalized understanding and is agreeable to treatment plan. All questions answered to patient's satisfaction.    Thersia Schuyler Stark, [place]

## 2023-10-26 NOTE — Telephone Encounter (Signed)
 Pt wanted to know if he could have something prescribed for spinal stenosis pain please advise

## 2023-10-27 ENCOUNTER — Telehealth: Payer: Self-pay | Admitting: Licensed Clinical Social Worker

## 2023-10-27 NOTE — Telephone Encounter (Signed)
 H&V Care Navigation CSW Progress Note  Clinical Social Worker contacted patient by phone to f/u on Medicaid referral, per notes does not look like that he was able to contact [place] with [employer] financial counseling. LCSW was unable to reach pt at [phone] and unable to leave voicemail as it is full. Sent reminder text with [place]'s contact information. Remain available, will re-attempt one more time as able. Pt has been sent community resources and provided with information for Medicaid.   Patient is participating in a Managed Medicaid Plan:  No, self pay only  SDOH Screenings   Food Insecurity: Food Insecurity Present (10/24/2023)  Housing: High Risk (10/24/2023)  Transportation Needs: No Transportation Needs (10/24/2023)  Utilities: Not At Risk (10/21/2023)  Alcohol Screen: Low Risk  (10/24/2023)  Depression (PHQ2-9): Low Risk  (10/26/2023)  Financial Resource Strain: Medium Risk (10/24/2023)  Physical Activity: Inactive (10/24/2023)  Social Connections: Unknown (10/24/2023)  Stress: No Stress Concern Present (10/24/2023)  Tobacco Use: Low Risk  (10/26/2023)  Health Literacy: Adequate Health Literacy (10/21/2023)   Marit Lark, MSW, LCSW Clinical Social Worker II [facility] Heart/Vascular Care Navigation  [phone]- work cell phone (preferred)

## 2023-10-31 ENCOUNTER — Telehealth: Payer: Self-pay | Admitting: Licensed Clinical Social Worker

## 2023-10-31 ENCOUNTER — Other Ambulatory Visit (HOSPITAL_COMMUNITY): Payer: Self-pay

## 2023-10-31 NOTE — Telephone Encounter (Signed)
 Called and spoke with pt.let him know the info per [employer]. Pt asked to have the info sent to him in a mychart message. Message sent to pt.

## 2023-10-31 NOTE — Telephone Encounter (Signed)
 H&V Care Navigation CSW Progress Note  Clinical Social Worker received a call back from pt regarding his Jardiance . Has not received a delivery of it at this time- we discussed calling [employer]- he confirms he has the number just didn't like the wait time. LCSW clarified unfortunately that we cannot expedite any delivery at this time, pt will need to call the company. He states understanding. Inquired if he spoke with [place], Investment banker, corporate for [employer]. He shares that he's called her several times and left voicemails. LCSW shared that his voicemail was full last time I called so she may not be able to leave a message in return. I was able to confirm with [place] that she has completed his referral for Medicaid and will return his calls again today. Remain available as needed.  Patient is participating in a Managed Medicaid Plan:  No, Medicaid pending  SDOH Screenings   Food Insecurity: Food Insecurity Present (10/24/2023)  Housing: High Risk (10/24/2023)  Transportation Needs: No Transportation Needs (10/24/2023)  Utilities: Not At Risk (10/21/2023)  Alcohol Screen: Low Risk  (10/24/2023)  Depression (PHQ2-9): Low Risk  (10/26/2023)  Financial Resource Strain: Medium Risk (10/24/2023)  Physical Activity: Inactive (10/24/2023)  Social Connections: Unknown (10/24/2023)  Stress: No Stress Concern Present (10/24/2023)  Tobacco Use: Low Risk  (10/26/2023)  Health Literacy: Adequate Health Literacy (10/21/2023)    Marit Lark, MSW, LCSW Clinical Social Worker II [facility] Heart/Vascular Care Navigation  [phone]- work cell phone (preferred)

## 2023-11-01 ENCOUNTER — Other Ambulatory Visit: Payer: Self-pay

## 2023-11-07 ENCOUNTER — Other Ambulatory Visit (HOSPITAL_COMMUNITY): Payer: Self-pay

## 2023-11-08 ENCOUNTER — Telehealth (HOSPITAL_COMMUNITY): Payer: Self-pay

## 2023-11-08 ENCOUNTER — Encounter (HOSPITAL_COMMUNITY): Payer: Self-pay

## 2023-11-08 NOTE — Telephone Encounter (Signed)
Attempted to call patient in regards to Cardiac Rehab - LM on VM   Sent letter 

## 2023-11-10 ENCOUNTER — Encounter (HOSPITAL_BASED_OUTPATIENT_CLINIC_OR_DEPARTMENT_OTHER): Payer: Self-pay | Admitting: Family Medicine

## 2023-11-10 ENCOUNTER — Other Ambulatory Visit (HOSPITAL_COMMUNITY): Payer: Self-pay

## 2023-11-10 ENCOUNTER — Telehealth: Payer: Self-pay | Admitting: Licensed Clinical Social Worker

## 2023-11-10 NOTE — Telephone Encounter (Signed)
 H&V Care Navigation CSW Progress Note  Clinical Social Worker contacted patient by phone to f/u on patient referral for Medicaid. Pt approved for Medicaid. Noted on file at this time, pharmacy benefits active. Attempted to reach pt to explain. Phone voicemail full. Will re-attempt again as able.  Patient is participating in a Managed Medicaid Plan:  Yes- [facility]  SDOH Screenings   Food Insecurity: Food Insecurity Present (10/24/2023)  Housing: High Risk (10/24/2023)  Transportation Needs: No Transportation Needs (10/24/2023)  Utilities: Not At Risk (10/21/2023)  Alcohol Screen: Low Risk  (10/24/2023)  Depression (PHQ2-9): Low Risk  (10/26/2023)  Financial Resource Strain: Medium Risk (10/24/2023)  Physical Activity: Inactive (10/24/2023)  Social Connections: Unknown (10/24/2023)  Stress: No Stress Concern Present (10/24/2023)  Tobacco Use: Low Risk  (10/26/2023)  Health Literacy: Adequate Health Literacy (10/21/2023)    Peter Bell, MSW, LCSW Clinical Social Worker II [facility] Heart/Vascular Care Navigation  [phone]- work cell phone (preferred)

## 2023-11-11 ENCOUNTER — Ambulatory Visit (HOSPITAL_COMMUNITY)
Admission: EM | Admit: 2023-11-11 | Discharge: 2023-11-11 | Disposition: A | Discharge status: Home / Self Care | Attending: Family Medicine | Admitting: Family Medicine

## 2023-11-11 ENCOUNTER — Encounter (HOSPITAL_COMMUNITY): Payer: Self-pay

## 2023-11-11 ENCOUNTER — Telehealth: Payer: Self-pay | Admitting: Licensed Clinical Social Worker

## 2023-11-11 DIAGNOSIS — E119 Type 2 diabetes mellitus without complications: Secondary | ICD-10-CM | POA: Diagnosis not present

## 2023-11-11 DIAGNOSIS — I10 Essential (primary) hypertension: Secondary | ICD-10-CM

## 2023-11-11 MED ORDER — ATORVASTATIN CALCIUM 80 MG PO TABS
80.0000 mg | ORAL_TABLET | Freq: Every day | ORAL | 0 refills | Status: DC
  Filled 2023-11-11: qty 30, 30d supply, fill #0

## 2023-11-11 MED ORDER — AMLODIPINE BESYLATE 10 MG PO TABS
10.0000 mg | ORAL_TABLET | Freq: Every day | ORAL | 0 refills | Status: DC
  Filled 2023-11-11: qty 30, 30d supply, fill #0

## 2023-11-11 MED ORDER — METFORMIN HCL 500 MG PO TABS
1000.0000 mg | ORAL_TABLET | Freq: Every day | ORAL | 0 refills | Status: DC
  Filled 2023-11-11: qty 60, 30d supply, fill #0

## 2023-11-11 MED ORDER — PANTOPRAZOLE SODIUM 20 MG PO TBEC
20.0000 mg | DELAYED_RELEASE_TABLET | Freq: Every day | ORAL | 0 refills | Status: DC
  Filled 2023-11-11: qty 30, 30d supply, fill #0

## 2023-11-11 NOTE — Discharge Instructions (Signed)
 Continue all your meds as prescribed.  I have sent prescriptions for amlodipine , atorvastatin , metformin , and pantoprazole .  Do not take prasugrel  (that's the bottle you threw away)  Go get your prescriptions at [facility] pharmacy at [place].

## 2023-11-11 NOTE — Telephone Encounter (Signed)
 H&V Care Navigation CSW Progress Note  Clinical Social Worker contacted patient by phone to f/u on patient referral for Medicaid. Pt approved for Medicaid. Noted on file at this time, pharmacy benefits active. Attempted to reach pt to explain. Phone voicemail full. Sent myChart. Available as needed moving forward.   Patient is participating in a Managed Medicaid Plan:  Yes- [employer]  SDOH Screenings   Food Insecurity: Food Insecurity Present (10/24/2023)  Housing: High Risk (10/24/2023)  Transportation Needs: No Transportation Needs (10/24/2023)  Utilities: Not At Risk (10/21/2023)  Alcohol Screen: Low Risk  (10/24/2023)  Depression (PHQ2-9): Low Risk  (10/26/2023)  Financial Resource Strain: Medium Risk (10/24/2023)  Physical Activity: Inactive (10/24/2023)  Social Connections: Unknown (10/24/2023)  Stress: No Stress Concern Present (10/24/2023)  Tobacco Use: Low Risk  (10/26/2023)  Health Literacy: Adequate Health Literacy (10/21/2023)    Marit Lark, MSW, LCSW Clinical Social Worker II [facility] Heart/Vascular Care Navigation  [phone]- work cell phone (preferred)

## 2023-11-11 NOTE — ED Triage Notes (Signed)
 Patient presenting with trouble and pain with swallowing onset 2 days. States even having trouble swallowing his spit and has to drink something to help solids move down.  Prescriptions or OTC medications tried: No    Patient also requesting refills of his Metformin , Amlodipine , and Atorvastatin .

## 2023-11-11 NOTE — ED Provider Notes (Addendum)
 [facility]    CSN: [id number] Arrival date & time: 11/11/23  1738      History   Chief Complaint Chief Complaint  Patient presents with   Dysphagia    HPI Peter Bell is a 53 y.o. male.   HPI He is having trouble swallowing, having to drink water after swallows food to get the food go down. Began bothering him 2 days ago.  Has had reflux in the past, rx's pantoprazole  20 mg 1 daily.  Newer meds include pantoprazole  20 mg, metformin , which he is apparently unaware was sent in 7/2, as he requests refills of that also.   Past Medical History:  Diagnosis Date   Asthma    Diabetes mellitus without complication (HCC)    Eczema    GERD (gastroesophageal reflux disease)    Hypertension    Myocardial infarction [facility])    Sciatica     Patient Active Problem List   Diagnosis Date Noted   Chronic low back pain 10/26/2023   NSTEMI (non-ST elevated myocardial infarction) (HCC) 09/25/2023   Impaired gait 03/31/2023   Type 2 diabetes mellitus with hyperglycemia, without long-term current use of insulin  (HCC) 03/31/2023   Neuropathic pain 03/31/2023   Spinal stenosis of lumbar region 04/22/2020   Acute pain of left knee 09/17/2019   Protrusion of lumbar intervertebral disc 05/29/2019   SOB (shortness of breath) 05/10/2013   Essential hypertension, benign 04/24/2013   Chest pain, unspecified 04/24/2013    Past Surgical History:  Procedure Laterality Date   CORONARY STENT INTERVENTION N/A 09/26/2023   Procedure: CORONARY STENT INTERVENTION;  Surgeon: Swaziland, [name], MD;  Location: [facility] INVASIVE CV LAB;  Service: Cardiovascular;  Laterality: N/A;   HERNIA REPAIR     LEFT HEART CATH AND CORONARY ANGIOGRAPHY N/A 09/26/2023   Procedure: LEFT HEART CATH AND CORONARY ANGIOGRAPHY;  Surgeon: Swaziland, [name], MD;  Location: [facility] INVASIVE CV LAB;  Service: Cardiovascular;  Laterality: N/A;       Home Medications    Prior to Admission medications   Medication Sig Start  Date End Date Taking? Authorizing Provider  aspirin  EC 81 MG tablet Take 1 tablet (81 mg total) by mouth daily. Swallow whole. 09/28/23  Yes Adams, Zane, PA-C  clopidogrel  (PLAVIX ) 75 MG tablet Take 1 tablet (75 mg total) by mouth daily. 10/21/23  Yes Duke, Jon Garre, PA  empagliflozin  (JARDIANCE ) 10 MG TABS tablet Take 1 tablet (10 mg total) by mouth daily. 09/27/23  Yes Adams, Zane, PA-C  losartan  (COZAAR ) 50 MG tablet Take 1 tablet (50 mg total) by mouth daily. 10/21/23 01/19/24 Yes Duke, Jon Garre, PA  metoprolol  succinate (TOPROL  XL) 25 MG 24 hr tablet Take 1 tablet (25 mg total) by mouth daily. 10/21/23  Yes Duke, Jon Garre, PA  amLODipine  (NORVASC ) 10 MG tablet Take 1 tablet (10 mg total) by mouth daily. 11/11/23   Vonna Sharlet POUR, MD  atorvastatin  (LIPITOR) 80 MG tablet Take 1 tablet (80 mg total) by mouth daily. 11/11/23   Vonna Sharlet POUR, MD  metFORMIN  (GLUCOPHAGE ) 500 MG tablet Take 2 tablets (1,000 mg total) by mouth daily. Take with meal. 11/11/23   Pebble Botkin, Sharlet POUR, MD  nitroGLYCERIN  (NITROSTAT ) 0.4 MG SL tablet Place 1 tablet (0.4 mg total) under the tongue every 5 (five) minutes x 3 doses as needed for chest pain. 09/27/23   Adams, Zane, PA-C  pantoprazole  (PROTONIX ) 20 MG tablet Take 1 tablet (20 mg total) by mouth daily. 11/11/23   Porchea Charrier,  Arsal Tappan K, MD  insulin  glargine (LANTUS) 100 UNIT/ML injection Inject 16 Units into the skin daily.  04/14/19  [provider]  pravastatin (PRAVACHOL) 40 MG tablet Take 40 mg by mouth daily.  04/14/19  [provider]    Family History Family History  Problem Relation Age of Onset   Cancer Mother    Cancer Father    Hypertension Sister    Heart murmur Sister    CVA Brother    CVA Brother    Hypertension Brother     Social History Social History   Tobacco Use   Smoking status: Never   Smokeless tobacco: Never  Vaping Use   Vaping status: Never Used  Substance Use Topics   Alcohol use: Not Currently    Drug use: No     Allergies   Patient has no known allergies.   Review of Systems Review of Systems   Physical Exam Triage Vital Signs ED Triage Vitals  Encounter Vitals Group     BP 11/11/23 1750 (!) 159/84     Girls Systolic BP Percentile --      Girls Diastolic BP Percentile --      Boys Systolic BP Percentile --      Boys Diastolic BP Percentile --      Pulse Rate 11/11/23 1750 63     Resp 11/11/23 1750 18     Temp 11/11/23 1750 98.7 F (37.1 C)     Temp Source 11/11/23 1750 Oral     SpO2 11/11/23 1750 96 %     Weight 11/11/23 1750 132 lb 0.9 oz (59.9 kg)     Height 11/11/23 1750 5' 5 (1.651 m)     Head Circumference --      Peak Flow --      Pain Score 11/11/23 1749 0     Pain Loc --      Pain Education --      Exclude from Growth Chart --    No data found.  Updated Vital Signs BP (!) 159/84 (BP Location: Left Arm)   Pulse 63   Temp 98.7 F (37.1 C) (Oral)   Resp 18   Ht 5' 5 (1.651 m)   Wt 59.9 kg   SpO2 96%   BMI 21.98 kg/m   Visual Acuity Right Eye Distance:   Left Eye Distance:   Bilateral Distance:    Right Eye Near:   Left Eye Near:    Bilateral Near:     Physical Exam Vitals reviewed.  Constitutional:      General: He is not in acute distress.    Appearance: He is not ill-appearing, toxic-appearing or diaphoretic.  HENT:     Right Ear: Tympanic membrane and ear canal normal.     Left Ear: Tympanic membrane and ear canal normal.     Nose: Nose normal.     Mouth/Throat:     Mouth: Mucous membranes are moist.     Pharynx: No oropharyngeal exudate or posterior oropharyngeal erythema.  Eyes:     Extraocular Movements: Extraocular movements intact.     Conjunctiva/sclera: Conjunctivae normal.     Pupils: Pupils are equal, round, and reactive to light.  Cardiovascular:     Rate and Rhythm: Normal rate and regular rhythm.     Heart sounds: No murmur heard. Pulmonary:     Effort: Pulmonary effort is normal. No respiratory distress.      Breath sounds: Normal breath sounds. No stridor. No wheezing, rhonchi or  rales.  Abdominal:     Palpations: Abdomen is soft.     Tenderness: There is no abdominal tenderness.     Comments: I can easily palpate his xiphoid process when he points out in the area of concern.  Musculoskeletal:     Cervical back: Neck supple.     Right lower leg: No edema.     Left lower leg: No edema.  Lymphadenopathy:     Cervical: No cervical adenopathy.  Skin:    Capillary Refill: Capillary refill takes less than 2 seconds.     Coloration: Skin is not jaundiced or pale.  Neurological:     General: No focal deficit present.     Mental Status: He is alert and oriented to person, place, and time.     Comments: Initially at rest it seemed that he might have some flattening of his right nasolabial fold.  He is able to smile and there is no asymmetry when he smiles.  Tongue is in the midline.  He has normal wrinkling of his frontal area.  Psychiatric:        Behavior: Behavior normal.      UC Treatments / Results  Labs (all labs ordered are listed, but only abnormal results are displayed) Labs Reviewed - No data to display  EKG   Radiology No results found.  Procedures Procedures (including critical care time)  Medications Ordered in UC Medications - No data to display  Initial Impression / Assessment and Plan / UC Course  I have reviewed the triage vital signs and the nursing notes.  Pertinent labs & imaging results that were available during my care of the patient were reviewed by me and considered in my medical decision making (see chart for details).     He has not started taking the pantoprazole  that was sent in for him in July 2.  He states he meant to pick that up and that he has several medicines he went to pick up at the [facility] health community pharmacy at [facility].  This pharmacy is not open on Saturdays and this is Friday evening.  I have sent in 30-day supplies of amlodipine ,  atorvastatin , pantoprazole , and metformin .  I have sent them to the [facility] health pharmacy at [place] that is open on Saturdays and he understands to go there to pick them up.  It is my hope that beginning the pantoprazole  will help these dysphagia symptoms.  I have asked him to go to the emergency room if he cannot swallow anything or if it worsens. He stated if he was not better with the medications however, he would come back here.  While we are sorting out his medicines and what he needed sent in he produced a sample bottle of prasugrel , and asked if he is still having to take that.  He has clopidogrel  and on his list and he has a bottle of that in his bag also.  I have asked him to not take the prasugrel  anymore.  He threw it in the trash    Final Clinical Impressions(s) / UC Diagnoses   Final diagnoses:  Essential hypertension  Type 2 diabetes mellitus without complication, without long-term current use of insulin  [facility])     Discharge Instructions      Continue all your meds as prescribed.  I have sent prescriptions for amlodipine , atorvastatin , metformin , and pantoprazole .  Do not take prasugrel  (that's the bottle you threw away)  Go get your prescriptions at the [facility]  [place] community pharmacy at [address].     ED Prescriptions     Medication Sig Dispense Auth. Provider   amLODipine  (NORVASC ) 10 MG tablet Take 1 tablet (10 mg total) by mouth daily. 30 tablet Anyely Cunning, Sharlet POUR, MD   atorvastatin  (LIPITOR) 80 MG tablet Take 1 tablet (80 mg total) by mouth daily. 30 tablet Ericha Whittingham, Sharlet POUR, MD   metFORMIN  (GLUCOPHAGE ) 500 MG tablet Take 2 tablets (1,000 mg total) by mouth daily. Take with meal. 60 tablet Kail Fraley, Sharlet POUR, MD   pantoprazole  (PROTONIX ) 20 MG tablet Take 1 tablet (20 mg total) by mouth daily. 30 tablet Jaishawn Witzke K, MD      PDMP not reviewed this encounter.   Vonna Sharlet POUR, MD 11/11/23 1845    Vonna Sharlet POUR, MD 11/11/23 [phone]

## 2023-11-12 ENCOUNTER — Other Ambulatory Visit (HOSPITAL_BASED_OUTPATIENT_CLINIC_OR_DEPARTMENT_OTHER): Payer: Self-pay

## 2023-11-14 ENCOUNTER — Emergency Department (HOSPITAL_COMMUNITY)

## 2023-11-14 ENCOUNTER — Observation Stay (HOSPITAL_COMMUNITY)

## 2023-11-14 ENCOUNTER — Encounter (HOSPITAL_COMMUNITY): Payer: Self-pay | Admitting: Emergency Medicine

## 2023-11-14 ENCOUNTER — Other Ambulatory Visit: Payer: Self-pay

## 2023-11-14 ENCOUNTER — Observation Stay (HOSPITAL_COMMUNITY)
Admission: EM | Admit: 2023-11-14 | Discharge: 2023-11-16 | Disposition: A | Discharge status: Home / Self Care | Attending: Family Medicine | Admitting: Family Medicine

## 2023-11-14 ENCOUNTER — Ambulatory Visit: Payer: Self-pay

## 2023-11-14 DIAGNOSIS — I639 Cerebral infarction, unspecified: Secondary | ICD-10-CM | POA: Diagnosis not present

## 2023-11-14 DIAGNOSIS — I6329 Cerebral infarction due to unspecified occlusion or stenosis of other precerebral arteries: Secondary | ICD-10-CM

## 2023-11-14 DIAGNOSIS — R29703 NIHSS score 3: Secondary | ICD-10-CM

## 2023-11-14 DIAGNOSIS — E1151 Type 2 diabetes mellitus with diabetic peripheral angiopathy without gangrene: Secondary | ICD-10-CM

## 2023-11-14 DIAGNOSIS — Z794 Long term (current) use of insulin: Secondary | ICD-10-CM | POA: Diagnosis not present

## 2023-11-14 DIAGNOSIS — R202 Paresthesia of skin: Secondary | ICD-10-CM | POA: Diagnosis present

## 2023-11-14 DIAGNOSIS — I6389 Other cerebral infarction: Principal | ICD-10-CM | POA: Insufficient documentation

## 2023-11-14 DIAGNOSIS — Z7982 Long term (current) use of aspirin: Secondary | ICD-10-CM | POA: Insufficient documentation

## 2023-11-14 DIAGNOSIS — E1165 Type 2 diabetes mellitus with hyperglycemia: Secondary | ICD-10-CM | POA: Diagnosis not present

## 2023-11-14 DIAGNOSIS — M48061 Spinal stenosis, lumbar region without neurogenic claudication: Secondary | ICD-10-CM | POA: Diagnosis not present

## 2023-11-14 DIAGNOSIS — R29818 Other symptoms and signs involving the nervous system: Secondary | ICD-10-CM | POA: Diagnosis not present

## 2023-11-14 DIAGNOSIS — I2510 Atherosclerotic heart disease of native coronary artery without angina pectoris: Secondary | ICD-10-CM | POA: Insufficient documentation

## 2023-11-14 DIAGNOSIS — I10 Essential (primary) hypertension: Secondary | ICD-10-CM | POA: Diagnosis not present

## 2023-11-14 DIAGNOSIS — Z955 Presence of coronary angioplasty implant and graft: Secondary | ICD-10-CM | POA: Insufficient documentation

## 2023-11-14 DIAGNOSIS — Z9861 Coronary angioplasty status: Secondary | ICD-10-CM | POA: Diagnosis not present

## 2023-11-14 LAB — COMPREHENSIVE METABOLIC PANEL WITH GFR
ALT: 14 U/L (ref 0–44)
AST: 21 U/L (ref 15–41)
Albumin: 3.6 g/dL (ref 3.5–5.0)
Alkaline Phosphatase: 103 U/L (ref 38–126)
Anion gap: 11 (ref 5–15)
BUN: 8 mg/dL (ref 6–20)
CO2: 20 mmol/L — ABNORMAL LOW (ref 22–32)
Calcium: 8.8 mg/dL — ABNORMAL LOW (ref 8.9–10.3)
Chloride: 107 mmol/L (ref 98–111)
Creatinine, Ser: 1.01 mg/dL (ref 0.61–1.24)
GFR, Estimated: >60 mL/min (ref >60)
Glucose, Bld: 294 mg/dL — ABNORMAL HIGH (ref 70–99)
Potassium: 4.1 mmol/L (ref 3.5–5.1)
Sodium: 138 mmol/L (ref 135–145)
Total Bilirubin: 0.9 mg/dL (ref 0.0–1.2)
Total Protein: 7.4 g/dL (ref 6.5–8.1)

## 2023-11-14 LAB — URINALYSIS, ROUTINE W REFLEX MICROSCOPIC
Bacteria, UA: NONE SEEN
Bilirubin Urine: NEGATIVE
Glucose, UA: >=500 mg/dL — AB
Hgb urine dipstick: NEGATIVE
Ketones, ur: NEGATIVE mg/dL
Leukocytes,Ua: NEGATIVE
Nitrite: NEGATIVE
Protein, ur: NEGATIVE mg/dL
Specific Gravity, Urine: 1.038 — ABNORMAL HIGH (ref 1.005–1.030)
pH: 5 (ref 5.0–8.0)

## 2023-11-14 LAB — CBC
HCT: 41.2 % (ref 39.0–52.0)
Hemoglobin: 13.5 g/dL (ref 13.0–17.0)
MCH: 30.8 pg (ref 26.0–34.0)
MCHC: 32.8 g/dL (ref 30.0–36.0)
MCV: 93.8 fL (ref 80.0–100.0)
Platelets: 334 K/uL (ref 150–400)
RBC: 4.39 MIL/uL (ref 4.22–5.81)
RDW: 14.3 % (ref 11.5–15.5)
WBC: 10.2 K/uL (ref 4.0–10.5)
nRBC: 0 % (ref 0.0–0.2)

## 2023-11-14 LAB — CBG MONITORING, ED: Glucose-Capillary: 249 mg/dL — ABNORMAL HIGH (ref 70–99)

## 2023-11-14 MED ORDER — SODIUM CHLORIDE 0.9 % IV SOLN: INTRAVENOUS | Status: DC

## 2023-11-14 MED ORDER — INSULIN ASPART 100 UNIT/ML IJ SOLN
0.0000 [IU] | INTRAMUSCULAR | Status: DC
  Administered 2023-11-15 (×2): 2 [IU] via SUBCUTANEOUS
  Administered 2023-11-15: 3 [IU] via SUBCUTANEOUS
  Administered 2023-11-15: 2 [IU] via SUBCUTANEOUS
  Administered 2023-11-15: 3 [IU] via SUBCUTANEOUS
  Administered 2023-11-16 (×2): 1 [IU] via SUBCUTANEOUS

## 2023-11-14 MED ORDER — ACETAMINOPHEN 325 MG PO TABS: 650.0000 mg | ORAL_TABLET | ORAL | Status: DC | PRN

## 2023-11-14 MED ORDER — ACETAMINOPHEN 650 MG RE SUPP: 650.0000 mg | RECTAL | Status: DC | PRN

## 2023-11-14 MED ORDER — ATORVASTATIN CALCIUM 80 MG PO TABS
80.0000 mg | ORAL_TABLET | Freq: Every day | ORAL | Status: DC
  Administered 2023-11-15: 80 mg via ORAL
  Filled 2023-11-14: qty 1

## 2023-11-14 MED ORDER — GADOBUTROL 1 MMOL/ML IV SOLN
6.0000 mL | Freq: Once | INTRAVENOUS | Status: AC | PRN
  Administered 2023-11-14: 6 mL via INTRAVENOUS

## 2023-11-14 MED ORDER — LACTATED RINGERS IV BOLUS: 1000.0000 mL | Freq: Once | INTRAVENOUS | Status: DC

## 2023-11-14 MED ORDER — CLOPIDOGREL BISULFATE 75 MG PO TABS
75.0000 mg | ORAL_TABLET | Freq: Every day | ORAL | Status: DC
  Administered 2023-11-15: 75 mg via ORAL
  Filled 2023-11-14: qty 1

## 2023-11-14 MED ORDER — ASPIRIN 81 MG PO TBEC
81.0000 mg | DELAYED_RELEASE_TABLET | Freq: Every day | ORAL | Status: DC
  Administered 2023-11-15: 81 mg via ORAL
  Filled 2023-11-14: qty 1

## 2023-11-14 MED ORDER — AMLODIPINE BESYLATE 5 MG PO TABS
10.0000 mg | ORAL_TABLET | Freq: Every day | ORAL | Status: DC
  Administered 2023-11-15: 10 mg via ORAL
  Filled 2023-11-14: qty 2

## 2023-11-14 MED ORDER — METOPROLOL SUCCINATE ER 25 MG PO TB24
25.0000 mg | ORAL_TABLET | Freq: Every day | ORAL | Status: DC
  Administered 2023-11-15: 25 mg via ORAL
  Filled 2023-11-14: qty 1

## 2023-11-14 MED ORDER — STROKE: EARLY STAGES OF RECOVERY BOOK
Freq: Once | Status: AC
  Filled 2023-11-14: qty 1

## 2023-11-14 MED ORDER — NITROGLYCERIN 0.4 MG SL SUBL: 0.4000 mg | SUBLINGUAL_TABLET | SUBLINGUAL | Status: DC | PRN

## 2023-11-14 MED ORDER — PANTOPRAZOLE SODIUM 20 MG PO TBEC
20.0000 mg | DELAYED_RELEASE_TABLET | Freq: Every day | ORAL | Status: DC
  Administered 2023-11-15: 20 mg via ORAL
  Filled 2023-11-14: qty 1

## 2023-11-14 MED ORDER — ACETAMINOPHEN 160 MG/5ML PO SOLN: 650.0000 mg | ORAL | Status: DC | PRN

## 2023-11-14 NOTE — ED Provider Triage Note (Signed)
 Emergency Medicine Provider Triage Evaluation Note  Peter Bell , a 53 y.o. male  was evaluated in triage.  Pt complains of left hand unable to detect cold or hot for the past 4 days.  Reports that he feels that he also has to squint whenever he tries to see it, symptoms have been ongoing again prior to his visit to urgent care.  He reports his nursing assistant told him to be evaluated as his symptoms could be signs of a stroke.  Review of Systems  Positive: Left hand numbness Negative: Headache, nausea, vomiting  Physical Exam  BP 139/87   Pulse 86   Temp 98.2 F (36.8 C) (Oral)   Resp 16   Ht 5' 5 (1.651 m)   Wt 59.4 kg   SpO2 97%   BMI 21.80 kg/m  Gen:   Awake, no distress   Resp:  Normal effort  MSK:   Moves extremities without difficulty  Other:  Facial asymmetry which was like this at [facility] visit.   Medical Decision Making  Medically screening exam initiated at 12:19 PM.  Appropriate orders placed.  ROCKET GUNDERSON was informed that the remainder of the evaluation will be completed by another provider, this initial triage assessment does not replace that evaluation, and the importance of remaining in the ED until their evaluation is complete.     Edin Kon, PA-C 11/14/23 1219

## 2023-11-14 NOTE — Subjective & Objective (Signed)
 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 

## 2023-11-14 NOTE — ED Notes (Signed)
 Requested urine sample. Pt stated he didn't have to go and didn't want to try.

## 2023-11-14 NOTE — ED Triage Notes (Addendum)
 Pt came in POV.  Pt complains of sensation changes in left hand and double vision when both eyes are open.  No double vision when looking with one eye. PT endorses some slurred speech and trouble swallowing unless with something to drink..  Mouth appears droopy to right side when not smiling but no droop with smile. Symptoms began 4 days ago. He associates it with recent change in metformin  dose.

## 2023-11-14 NOTE — ED Notes (Signed)
 Patient returned from MRI.

## 2023-11-14 NOTE — ED Notes (Signed)
 Patient transported to MRI

## 2023-11-14 NOTE — Telephone Encounter (Signed)
 FYI Only or Action Required?: FYI only for provider.  Patient was last seen in primary care on 10/26/2023 by Knute Thersia Bitters, FNP.  Called Nurse Triage reporting trouble swallowing and Numbness.  Symptoms began several days ago.  Interventions attempted: Prescription medications: home meds (pantoprazole , BP meds).  Symptoms are: he feels his speech is slurred (not noticeable to triager), double vision, difficulty swallowing, left hand lack of temperature sensitivity unchanged .  Triage Disposition: Go to ED Now (or PCP Triage)  Patient/caregiver understands and will follow disposition?: Yes                  Copied from CRM [id number]. Topic: Clinical - Red Word Triage >> Nov 14, 2023 10:28 AM Suzen RAMAN wrote: Red Word that prompted transfer to Nurse Triage: trouble swallowing w/o liquid and unable to feel hot or cold on/in left hand Reason for Disposition  Double vision  Answer Assessment - Initial Assessment Questions Patient states no worsening of his symptoms since being seen at [facility]. Advised patient to call 911 or go straight to ED due to possible stroke symptoms.   1. DESCRIPTION: Tell me more about this problem. Are you  having trouble swallowing liquids, solids, or both? Any trouble with swallowing saliva (spit)?     Patient states sometimes he is choking on his saliva, not all the time but sometimes. He states mainly when he eats something he needs to drink liquids directly behind it. Otherwise, he states he is choking on his food.  2. SEVERITY: How bad is the swallowing difficulty?  (Scale 1-10; or mild, moderate, severe)     More than moderate. He states he went to urgent care on 11/11/23 for the symptoms.  3. ONSET: When did the swallowing problems begin?      X 4 days.  4. CAUSE: What do you think is causing the problem?  (e.g., dry mouth, food or pill stuck in throat, mouth pain, sore throat, progression of disease process such as dementia  or Parkinson's disease).      Patient states he had run out of his pantoprazole  and just started taking it again a few days ago.  5. CHRONIC or RECURRENT: Is this a new problem for you?  If No, ask: How long have you had this problem? (e.g., days, weeks, months)      No.  6. OTHER SYMPTOMS: Do you have any other symptoms? (e.g., chest pain, difficulty breathing, mouth sores, sore throat, swollen tongue, chest pain)     Left hand he states no tingling or numbness but he states if a cup is hot or cold he does not have any temperature sensitivity in his left hand x 2-3 days. Patient states he has had double vision and slurred speech x 3-4 days. Patient denies tongue or facial swelling.   7. PREGNANCY: Is there any chance you are pregnant? When was your last menstrual period?     N/A.  Protocols used: Swallowing Difficulty-A-AH, Vision Loss or Change-A-AH

## 2023-11-14 NOTE — H&P (Signed)
 Peter Bell FMW:[id number] DOB: 16-Jun-1970 DOA: 11/14/2023     PCP: Knute Thersia Bitters, FNP   Outpatient Specialists:  CARDS: Dr. Wilbert Bihari, MD    Patient arrived to ER on 11/14/23 at 1139 Referred by Attending Freddi Hamilton, MD   Patient coming from:    home Lives   With family     Chief Complaint:   Chief Complaint  Patient presents with   Numbness   Diplopia    HPI: Peter Bell is a 53 y.o. male with medical history significant of DM2, CAD spinal stenosis    Presented with change in sensation left arm Hx of DM2, CAD, HTN Here with 4 days of trouble swallowing, numbness of left hand and arm, double vision Seen by NEurology MRI showing acute CVA in the pons  he is already on Aspirin  81 mg on a Plavix  Unable to detect on cold or hot in his left hand and arm for past 4 days No weakness in the left arm no change in sensation or strength in his left leg .  Did feel that his speech was slurred Has been compliant with Plavix  and aspirin   Denies significant ETOH intake   Does not smoke   Denies marijuana use     Regarding pertinent Chronic problems:    Hyperlipidemia - on statins Lipitor (atorvastatin )  Lipid Panel     Component Value Date/Time   CHOL 156 09/26/2023 0040   CHOL 108 03/31/2023 1001   TRIG 54 09/26/2023 0040   HDL 32 (L) 09/26/2023 0040   HDL 29 (L) 03/31/2023 1001   CHOLHDL 4.9 09/26/2023 0040   VLDL 11 09/26/2023 0040   LDLCALC 113 (H) 09/26/2023 0040   LDLCALC 66 03/31/2023 1001   LABVLDL 13 03/31/2023 1001     HTN on Norvasc  Cozaar  Toprol    chronic CHF diastolic/systolic/ combined - last echo  Recent Results (from the past 56199 hours)  ECHOCARDIOGRAM COMPLETE   Collection Time: 09/27/23  8:04 AM  Result Value   Weight 2,195.2   Height 66   BP 157/100   S' Lateral 3.20   Area-P 1/2 4.39   Est EF 55 - 60%   Narrative      ECHOCARDIOGRAM REPORT        IMPRESSIONS    1. Left ventricular ejection fraction,  by estimation, is 55 to 60%. The left ventricle has normal function. The left ventricle demonstrates regional wall motion abnormalities (see scoring diagram/findings for description). There is mild concentric left  ventricular hypertrophy. Left ventricular diastolic parameters are consistent with Grade I diastolic dysfunction (impaired relaxation). There is mild hypokinesis of the left ventricular, basal-mid lateral wall.  2. Right ventricular systolic function is normal. The right ventricular size is normal. Tricuspid regurgitation signal is inadequate for assessing PA pressure.  3. Left atrial size was mildly dilated.  4. The mitral valve is normal in structure. No evidence of mitral valve regurgitation. No evidence of mitral stenosis.  5. The aortic valve is tricuspid. Aortic valve regurgitation is not visualized. No aortic stenosis is present.  6. The inferior vena cava is normal in size with greater than 50% respiratory variability, suggesting right atrial pressure of 3 mmHg.            CAD  - On Aspirin , statin, betablocker, Plavix                  - STEMI followed by cardiology                -  last cardiac cath    09/25/2023 with troponin elevation and ST depression. Patient underwent drug-eluting stent placement due to severe vessel disease in the LAD, OM, left CX.     DM 2 -  Lab Results  Component Value Date   HGBA1C 8.8 (H) 09/26/2023   on insulin ,         Asthma -well   controlled on home inhalers/ nebs                        Chronic anemia - baseline hg Hemoglobin & Hematocrit  Recent Labs    09/26/23 1816 09/27/23 0520 11/14/23 1215  HGB 14.0 12.9* 13.5     While in ER: Clinical Course as of 11/14/23 2312  Mon Nov 14, 2023  1841 I discussed with on-call neurology, he recommends MRI brain and orbits with and without contrast to fully evaluate his symptoms. [SG]    Clinical Course User Index [SG] Freddi Hamilton, MD       Lab Orders         Comprehensive  metabolic panel         CBC         Urinalysis, Routine w reflex microscopic -Urine, Clean Catch         CBG monitoring, ED      CT HEAD   NON acute   MRI brain  Small acute infarct within the right pons in the region of the paramedian pontine reticular formation, likely cause of diplopia. 2. 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 3. Multifocal hyperintense T2-weighted signal within the cerebral white matter, most commonly due to chronic small vessel disease.  CXR - ***NON acute    Following Medications were ordered in ER: Medications  lactated ringers  bolus 1,000 mL (1,000 mLs Intravenous Patient Refused/Not Given 11/14/23 1753)  gadobutrol  (GADAVIST ) 1 MMOL/ML injection 6 mL (6 mLs Intravenous Contrast Given 11/14/23 2147)    _______________________________________________________ ER Provider Called:    Neurology Dr.Arora  They Recommend admit to medicine    SEEN in ER    ED Triage Vitals  Encounter Vitals Group     BP 11/14/23 1146 139/87     Girls Systolic BP Percentile --      Girls Diastolic BP Percentile --      Boys Systolic BP Percentile --      Boys Diastolic BP Percentile --      Pulse Rate 11/14/23 1146 86     Resp 11/14/23 1146 16     Temp 11/14/23 1146 98.2 F (36.8 C)     Temp Source 11/14/23 1146 Oral     SpO2 11/14/23 1146 97 %     Weight 11/14/23 1155 131 lb (59.4 kg)     Height 11/14/23 1155 5' 5 (1.651 m)     Head Circumference --      Peak Flow --      Pain Score 11/14/23 1155 0     Pain Loc --      Pain Education --      Exclude from Growth Chart --   UFJK(75)@     _________________________________________ Significant initial  Findings: Abnormal Labs Reviewed  COMPREHENSIVE METABOLIC PANEL WITH GFR - Abnormal; Notable for the following components:      Result Value   CO2 20 (*)    Glucose, Bld 294 (*)    Calcium  8.8 (*)    All other components within normal limits  URINALYSIS, ROUTINE W REFLEX  MICROSCOPIC - Abnormal; Notable  for the following components:   Specific Gravity, Urine 1.038 (*)    Glucose, UA >=500 (*)    All other components within normal limits  CBG MONITORING, ED - Abnormal; Notable for the following components:   Glucose-Capillary 249 (*)    All other components within normal limits     ECG: Ordered Personally reviewed and interpreted by me showing: HR : 68 Rhythm:Normal sinus rhythm Left axis deviation Left ventricular hypertrophy with repolarization abnormality QTC 440   The recent clinical data is shown below. Vitals:   11/14/23 1915 11/14/23 2000 11/14/23 2130 11/14/23 2230  BP: (!) 185/117 (!) 148/108 (!) 171/96 117/78  Pulse: (!) 51 (!) 45 (!) 55 60  Resp:  16  16  Temp:  98.6 F (37 C)    TempSrc:  Oral    SpO2: 100% 100% 96% 100%  Weight:      Height:          WBC     Component Value Date/Time   WBC 10.2 11/14/2023 1215   LYMPHSABS 2.7 09/25/2023 1636   MONOABS 0.8 09/25/2023 1636   EOSABS 0.1 09/25/2023 1636   BASOSABS 0.1 09/25/2023 1636       UA   no evidence of UTI      Urine analysis:    Component Value Date/Time   COLORURINE YELLOW 11/14/2023 1516   APPEARANCEUR CLEAR 11/14/2023 1516   LABSPEC 1.038 (H) 11/14/2023 1516   PHURINE 5.0 11/14/2023 1516   GLUCOSEU >=500 (A) 11/14/2023 1516   HGBUR NEGATIVE 11/14/2023 1516   BILIRUBINUR NEGATIVE 11/14/2023 1516   BILIRUBINUR negative 03/30/2023 1036   KETONESUR NEGATIVE 11/14/2023 1516   PROTEINUR NEGATIVE 11/14/2023 1516   UROBILINOGEN 0.2 03/30/2023 1036   UROBILINOGEN 1.0 07/03/2012 0002   NITRITE NEGATIVE 11/14/2023 1516   LEUKOCYTESUR NEGATIVE 11/14/2023 1516    Results for orders placed or performed during the hospital encounter of 03/30/23  Gastrointestinal Panel by PCR , Stool     Status: Abnormal   Collection Time: 03/30/23 10:04 AM   Specimen: Stool  Result Value Ref Range Status   Campylobacter species NOT DETECTED NOT DETECTED Final   Plesimonas shigelloides NOT DETECTED NOT  DETECTED Final   Salmonella species NOT DETECTED NOT DETECTED Final   Yersinia enterocolitica NOT DETECTED NOT DETECTED Final   Vibrio species NOT DETECTED NOT DETECTED Final   Vibrio cholerae NOT DETECTED NOT DETECTED Final   Enteroaggregative E coli (EAEC) NOT DETECTED NOT DETECTED Final   Enteropathogenic E coli (EPEC) NOT DETECTED NOT DETECTED Final   Enterotoxigenic E coli (ETEC) NOT DETECTED NOT DETECTED Final   Shiga like toxin producing E coli (STEC) NOT DETECTED NOT DETECTED Final   Shigella/Enteroinvasive E coli (EIEC) NOT DETECTED NOT DETECTED Final   Cryptosporidium NOT DETECTED NOT DETECTED Final   Cyclospora cayetanensis NOT DETECTED NOT DETECTED Final   Entamoeba histolytica NOT DETECTED NOT DETECTED Final   Giardia lamblia NOT DETECTED NOT DETECTED Final   Adenovirus F40/41 NOT DETECTED NOT DETECTED Final   Astrovirus NOT DETECTED NOT DETECTED Final   Norovirus GI/GII NOT DETECTED NOT DETECTED Final   Rotavirus A NOT DETECTED NOT DETECTED Final   Sapovirus (I, II, IV, and V) DETECTED (A) NOT DETECTED Final    Comment: Performed at [facility], [address] [place], [place] [address]  C Difficile Quick Screen w PCR reflex     Status: None   Collection Time: 03/30/23 10:05 AM   Specimen:  Stool  Result Value Ref Range Status   C Diff antigen NEGATIVE NEGATIVE Final   C Diff toxin NEGATIVE NEGATIVE Final   C Diff interpretation No C. difficile detected.  Final    Comment: Performed at [facility], [address] [place], [place] [address]     __________________________________________________________ Recent Labs  Lab 11/14/23 1215  NA 138  K 4.1  CO2 20*  GLUCOSE 294*  BUN 8  CREATININE 1.01  CALCIUM  8.8*    Cr   stable,  Lab Results  Component Value Date   CREATININE 1.01 11/14/2023   CREATININE 1.02 09/27/2023   CREATININE 0.93 09/26/2023    Recent Labs  Lab 11/14/23 1215  AST 21  ALT 14  ALKPHOS 103  BILITOT 0.9  PROT 7.4   ALBUMIN 3.6   Lab Results  Component Value Date   CALCIUM  8.8 (L) 11/14/2023    Plt: Lab Results  Component Value Date   PLT 334 11/14/2023       Recent Labs  Lab 11/14/23 1215  WBC 10.2  HGB 13.5  HCT 41.2  MCV 93.8  PLT 334    HG/HCT   stable,       Component Value Date/Time   HGB 13.5 11/14/2023 1215   HCT 41.2 11/14/2023 1215   MCV 93.8 11/14/2023 1215       _______________________________________________ Hospitalist was called for admission for   Acute ischemic stroke [facility])    The following Work up has been ordered so far:  Orders Placed This Encounter  Procedures   CT Head Wo Contrast   MR Brain W and Wo Contrast   MR ORBITS W WO CONTRAST   Comprehensive metabolic panel   CBC   Urinalysis, Routine w reflex microscopic -Urine, Clean Catch   Diet NPO time specified   Document Height and Actual Weight   Consult to Neuro Hospitalist   Consult to neurology   Consult to hospitalist   CBG monitoring, ED   ED EKG     OTHER Significant initial  Findings:  labs showing:     DM  labs:  HbA1C: Recent Labs    03/31/23 1001 09/26/23 0040  HGBA1C 7.5* 8.8*    CBG (last 3)  Recent Labs    11/14/23 1217  GLUCAP 249*          Cultures: No results found for: SDES, SPECREQUEST, CULT, REPTSTATUS   Radiological Exams on Admission: MR Brain W and Wo Contrast Result Date: 11/14/2023 EXAM: MRI BRAIN AND ORBITS WITH AND WITHOUT CONTRAST 11/14/2023 09:00:50 PM TECHNIQUE: Multiplanar multisequence MRI of the brain and orbits was performed with and without the administration of intravenous contrast. COMPARISON: None available. CLINICAL HISTORY: Neuro deficit, acute, stroke suspected. FINDINGS: BRAIN AND VENTRICLES: Small acute infarct within the right pons in the region of the paramedian pontine reticular formation. This is a likely cause of diplopia. 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 Multifocal hyperintense T2-weighted signal within the  cerebral white matter, most commonly due to chronic small vessel disease. No acute intracranial hemorrhage. No mass or abnormal enhancement. No midline shift. No hydrocephalus. The sella is unremarkable. Normal flow voids. ORBITS: Normal globes. Lenses are normally located. Symmetric caliber and signal of the optic nerves. Symmetric extraocular muscles. No orbital mass. No abnormal enhancement. SINUSES AND MASTOIDS: Clear. BONES AND SOFT TISSUES: Normal bone marrow signal. No acute soft tissue abnormality. IMPRESSION: 1. Small acute infarct within the right pons in the region of the paramedian  pontine reticular formation, likely cause of diplopia. 2. 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 3. Multifocal hyperintense T2-weighted signal within the cerebral white matter, most commonly due to chronic small vessel disease. 4. Normal MRI of the orbits Electronically signed by: Franky Stanford MD 11/14/2023 10:03 PM EDT RP Workstation: [id number]   MR ORBITS W WO CONTRAST Result Date: 11/14/2023 EXAM: MRI BRAIN AND ORBITS WITH AND WITHOUT CONTRAST 11/14/2023 09:00:50 PM TECHNIQUE: Multiplanar multisequence MRI of the brain and orbits was performed with and without the administration of intravenous contrast. COMPARISON: None available. CLINICAL HISTORY: Neuro deficit, acute, stroke suspected. FINDINGS: BRAIN AND VENTRICLES: Small acute infarct within the right pons in the region of the paramedian pontine reticular formation. This is a likely cause of diplopia. 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 Multifocal hyperintense T2-weighted signal within the cerebral white matter, most commonly due to chronic small vessel disease. No acute intracranial hemorrhage. No mass or abnormal enhancement. No midline shift. No hydrocephalus. The sella is unremarkable. Normal flow voids. ORBITS: Normal globes. Lenses are normally located. Symmetric caliber and signal of the optic nerves. Symmetric extraocular muscles. No  orbital mass. No abnormal enhancement. SINUSES AND MASTOIDS: Clear. BONES AND SOFT TISSUES: Normal bone marrow signal. No acute soft tissue abnormality. IMPRESSION: 1. Small acute infarct within the right pons in the region of the paramedian pontine reticular formation, likely cause of diplopia. 2. 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 3. Multifocal hyperintense T2-weighted signal within the cerebral white matter, most commonly due to chronic small vessel disease. 4. Normal MRI of the orbits Electronically signed by: Franky Stanford MD 11/14/2023 10:03 PM EDT RP Workstation: [id number]   CT Head Wo Contrast Result Date: 11/14/2023 CLINICAL DATA:  Acute neurologic deficit.  Suspected stroke. EXAM: CT HEAD WITHOUT CONTRAST TECHNIQUE: Contiguous axial images were obtained from the base of the skull through the vertex without intravenous contrast. RADIATION DOSE REDUCTION: This exam was performed according to the departmental dose-optimization program which includes automated exposure control, adjustment of the mA and/or kV according to patient size and/or use of iterative reconstruction technique. COMPARISON:  03/15/2019 FINDINGS: Brain: No evidence of intracranial hemorrhage, acute infarction, hydrocephalus, extra-axial collection, or mass lesion/mass effect. Mild chronic small vessel disease is again noted. Old lacunar infarct noted in the left pons. Vascular:  No hyperdense vessel or other acute findings. Skull: No evidence of fracture or other significant bone abnormality. Sinuses/Orbits:  No acute findings. Other: None. IMPRESSION: No acute intracranial abnormality. Mild chronic small vessel disease and old left pontine lacune. Electronically Signed   By: Norleen DELENA Kil M.D.   On: 11/14/2023 17:47   _______________________________________________________________________________________________________ Latest  Blood pressure 117/78, pulse 60, temperature 98.6 F (37 C), temperature source Oral, resp.  rate 16, height 5' 5 (1.651 m), weight 59.4 kg, SpO2 100%.   Vitals  labs and radiology finding personally reviewed  Review of Systems:    Pertinent positives include: left arm temperature changes slurred speech, Constitutional:  No weight loss, night sweats, Fevers, chills, fatigue, weight loss  HEENT:  No headaches, Difficulty swallowing,Tooth/dental problems,Sore throat,  No sneezing, itching, ear ache, nasal congestion, post nasal drip,  Cardio-vascular:  No chest pain, Orthopnea, PND, anasarca, dizziness, palpitations.no Bilateral lower extremity swelling  GI:  No heartburn, indigestion, abdominal pain, nausea, vomiting, diarrhea, change in bowel habits, loss of appetite, melena, blood in stool, hematemesis Resp:  no shortness of breath at rest. No dyspnea on exertion, No excess mucus, no productive cough, No non-productive  cough, No coughing up of blood.No change in color of mucus.No wheezing. Skin:  no rash or lesions. No jaundice GU:  no dysuria, change in color of urine, no urgency or frequency. No straining to urinate.  No flank pain.  Musculoskeletal:  No joint pain or no joint swelling. No decreased range of motion. No back pain.  Psych:  No change in mood or affect. No depression or anxiety. No memory loss.  Neuro: no localizing neurological complaints, no tingling, no weakness, no double vision, no gait abnormality, no  no confusion  All systems reviewed and apart from HOPI all are negative _______________________________________________________________________________________________ Past Medical History:   Past Medical History:  Diagnosis Date   Asthma    Diabetes mellitus without complication (HCC)    Eczema    GERD (gastroesophageal reflux disease)    Hypertension    Myocardial infarction [facility])    Sciatica       Past Surgical History:  Procedure Laterality Date   CORONARY STENT INTERVENTION N/A 09/26/2023   Procedure: CORONARY STENT INTERVENTION;   Surgeon: Swaziland, [name], MD;  Location: [facility] INVASIVE CV LAB;  Service: Cardiovascular;  Laterality: N/A;   HERNIA REPAIR     LEFT HEART CATH AND CORONARY ANGIOGRAPHY N/A 09/26/2023   Procedure: LEFT HEART CATH AND CORONARY ANGIOGRAPHY;  Surgeon: Swaziland, [name], MD;  Location: [facility] INVASIVE CV LAB;  Service: Cardiovascular;  Laterality: N/A;    Social History:  Ambulatory   cane,      reports that he has never smoked. He has never used smokeless tobacco. He reports that he does not currently use alcohol. He reports that he does not use drugs.   Family History:   Family History  Problem Relation Age of Onset   Cancer Mother    Cancer Father    Hypertension Sister    Heart murmur Sister    CVA Brother    CVA Brother    Hypertension Brother    ______________________________________________________________________________________________ Allergies: No Known Allergies   Prior to Admission medications   Medication Sig Start Date End Date Taking? Authorizing Provider  amLODipine  (NORVASC ) 10 MG tablet Take 1 tablet (10 mg total) by mouth daily. 11/11/23  Yes Vonna Sharlet POUR, MD  aspirin  EC 81 MG tablet Take 1 tablet (81 mg total) by mouth daily. Swallow whole. 09/28/23  Yes Adams, Zane, PA-C  atorvastatin  (LIPITOR) 80 MG tablet Take 1 tablet (80 mg total) by mouth daily. 11/11/23  Yes Vonna Sharlet POUR, MD  clopidogrel  (PLAVIX ) 75 MG tablet Take 1 tablet (75 mg total) by mouth daily. 10/21/23  Yes Duke, Jon Garre, PA  empagliflozin  (JARDIANCE ) 10 MG TABS tablet Take 1 tablet (10 mg total) by mouth daily. 09/27/23  Yes Adams, Zane, PA-C  losartan  (COZAAR ) 50 MG tablet Take 1 tablet (50 mg total) by mouth daily. 10/21/23 01/19/24 Yes Duke, Jon Garre, PA  metFORMIN  (GLUCOPHAGE ) 500 MG tablet Take 2 tablets (1,000 mg total) by mouth daily. Take with meal. 11/11/23  Yes Banister, Sharlet POUR, MD  metoprolol  succinate (TOPROL  XL) 25 MG 24 hr tablet Take 1 tablet (25 mg total) by mouth daily.  10/21/23  Yes Duke, Jon Garre, PA  nitroGLYCERIN  (NITROSTAT ) 0.4 MG SL tablet Place 1 tablet (0.4 mg total) under the tongue every 5 (five) minutes x 3 doses as needed for chest pain. 09/27/23  Yes Adams, Zane, PA-C  pantoprazole  (PROTONIX ) 20 MG tablet Take 1 tablet (20 mg total) by mouth daily. 11/11/23  Yes Banister, Pamela K,  MD  insulin  glargine (LANTUS) 100 UNIT/ML injection Inject 16 Units into the skin daily.  04/14/19  [provider]  pravastatin (PRAVACHOL) 40 MG tablet Take 40 mg by mouth daily.  04/14/19  [provider]    ___________________________________________________________________________________________________ Physical Exam:    11/14/2023   10:30 PM 11/14/2023    9:30 PM 11/14/2023    8:00 PM  Vitals with BMI  Systolic 117 171 851  Diastolic 78 96 108  Pulse 60 55 45     1. General:  in No  Acute distress   Chronically ill   -appearing 2. Psychological: Alert and   Oriented 3. Head/ENT:    Dry Mucous Membranes                          Head Non traumatic, neck supple                          Normal *** Poor Dentition 4. SKIN: normal *** decreased Skin turgor,  Skin clean Dry and intact no rash    5. Heart: Regular rate and rhythm no*** Murmur, no Rub or gallop 6. Lungs: ***Clear to auscultation bilaterally, no wheezes or crackles   7. Abdomen: Soft, ***non-tender, Non distended *** obese ***bowel sounds present 8. Lower extremities: no clubbing, cyanosis, no ***edema 9. Neurologically Grossly intact, moving all 4 extremities equally *** strength 5 out of 5 in all 4 extremities cranial nerves II through XII intact 10. MSK: Normal range of motion    Chart has been reviewed  ______________________________________________________________________________________________  Assessment/Plan 53 y.o. male with medical history significant of DM2, CAD spinal stenosis    Admitted for   Acute ischemic stroke (     Present on  Admission: **None**     No problem-specific Assessment & Plan notes found for this encounter.    Other plan as per orders.  DVT prophylaxis:  SCD       Code Status:    Code Status: Prior FULL CODE  as per patient   I had personally discussed CODE STATUS with patient   ACP   none     Family Communication:   Family not at  Bedside    Diet  Diet Orders (From admission, onward)     Start     Ordered   11/14/23 1204  Diet NPO time specified  Diet effective now        11/14/23 1204            Disposition Plan:   To home once workup is complete and patient is stable   Following barriers for discharge:                             Stroke  work up is complete                                                      Will need consultants to evaluate patient prior to discharge                            Consult Orders  (From admission, onward)  Start     Ordered   11/14/23 2209  Consult to hospitalist  Pg by Elspeth  Once       Provider:  (Not yet assigned)  Question Answer Comment  Place call to: [facility]   Reason for Consult Admit      11/14/23 2212   11/14/23 1810  Consult to Neuro Hospitalist  Pg by Elspeth  Once       Provider:  (Not yet assigned)  Question Answer Comment  Place call to: Neuro Hospitalist on call   Reason for Consult Admit      11/14/23 1809                              Transition of care consulted                   Nutrition    consulted                                    Consults called: neurology    Admission status:  ED Disposition     ED Disposition  Admit   Condition  --   Comment  Hospital Area: [name] [[id number]]  Level of Care: Telemetry Medical [104]  I expect the patient will be discharged within 24 hours: No (not a candidate for [facility]-2W observation unit)  May place patient in observation at [facility] or [facility] if equivalent level of care is available:: No  Covid  Evaluation: Asymptomatic - no recent exposure (last 10 days) testing not required  Diagnosis: Stroke [facility]) [[id number]]  Admitting Physician: Tayten Bergdoll [[id number]]  Attending Physician: Allesandra Huebsch [[id number]]  For patients discharging to extended facilities (i.e. SNF, AL, group homes or LTAC) initiate:: Discharge to SNF/Facility Placement COVID-19 Lab Testing Protocol           Obs     Level of care     tele   indefinitely please discontinue once patient no longer qualifies COVID-19 Labs    Peter Bell 11/14/2023, 11:44 PM    [facility]     after 2 AM please page floor coverage   If 7AM-7PM, please contact the day team taking care of the patient using [email or web address]

## 2023-11-14 NOTE — ED Provider Notes (Signed)
 [place] EMERGENCY DEPARTMENT AT [facility] Provider Note   CSN: [id number] Arrival date & time: 11/14/23  1139     Patient presents with: Numbness and Diplopia   Peter Bell is a 53 y.o. male.   HPI 53 year old male presents with multiple symptoms for 4 days.  Started off with trouble swallowing food.  He states he is able to swallow it if he puts water with it but otherwise he is having difficulty swallowing.  However there is no pain in his throat.  He has also noticed that he cannot sense hot or cold can sensation in his left hand and that sensation goes up his left arm when he tested it.  There is no weakness in his arm.  No sensations or weakness in his left leg.  He is also feel like his speech is slurred and he has been having double vision when he has both eyes open for the last 4 days.  If he closes either eye it goes away.  No headaches.  No chest pain.  Prior to Admission medications   Medication Sig Start Date End Date Taking? Authorizing Provider  amLODipine  (NORVASC ) 10 MG tablet Take 1 tablet (10 mg total) by mouth daily. 11/11/23  Yes Vonna Sharlet POUR, MD  aspirin  EC 81 MG tablet Take 1 tablet (81 mg total) by mouth daily. Swallow whole. 09/28/23  Yes Adams, Zane, PA-C  atorvastatin  (LIPITOR) 80 MG tablet Take 1 tablet (80 mg total) by mouth daily. 11/11/23  Yes Vonna Sharlet POUR, MD  clopidogrel  (PLAVIX ) 75 MG tablet Take 1 tablet (75 mg total) by mouth daily. 10/21/23  Yes Duke, Jon Garre, PA  empagliflozin  (JARDIANCE ) 10 MG TABS tablet Take 1 tablet (10 mg total) by mouth daily. 09/27/23  Yes Adams, Zane, PA-C  losartan  (COZAAR ) 50 MG tablet Take 1 tablet (50 mg total) by mouth daily. 10/21/23 01/19/24 Yes Duke, Jon Garre, PA  metFORMIN  (GLUCOPHAGE ) 500 MG tablet Take 2 tablets (1,000 mg total) by mouth daily. Take with meal. 11/11/23  Yes Banister, Sharlet POUR, MD  metoprolol  succinate (TOPROL  XL) 25 MG 24 hr tablet Take 1 tablet (25 mg total) by mouth  daily. 10/21/23  Yes Duke, Jon Garre, PA  nitroGLYCERIN  (NITROSTAT ) 0.4 MG SL tablet Place 1 tablet (0.4 mg total) under the tongue every 5 (five) minutes x 3 doses as needed for chest pain. 09/27/23  Yes Adams, Zane, PA-C  pantoprazole  (PROTONIX ) 20 MG tablet Take 1 tablet (20 mg total) by mouth daily. 11/11/23  Yes Vonna Sharlet POUR, MD  insulin  glargine (LANTUS) 100 UNIT/ML injection Inject 16 Units into the skin daily.  04/14/19  [provider]  pravastatin (PRAVACHOL) 40 MG tablet Take 40 mg by mouth daily.  04/14/19  [provider]    Allergies: Patient has no known allergies.    Review of Systems  HENT:  Positive for trouble swallowing.   Eyes:  Positive for visual disturbance.  Cardiovascular:  Negative for chest pain.  Neurological:  Positive for numbness. Negative for weakness and headaches.    Updated Vital Signs BP 117/78   Pulse 60   Temp 98.6 F (37 C) (Oral)   Resp 16   Ht 5' 5 (1.651 m)   Wt 59.4 kg   SpO2 100%   BMI 21.80 kg/m   Physical Exam Vitals and nursing note reviewed.  Constitutional:      Appearance: He is well-developed.  HENT:     Head: Normocephalic  and atraumatic.  Eyes:     Extraocular Movements: Extraocular movements intact.     Pupils: Pupils are equal, round, and reactive to light.     Comments: Patient reports horizontal diplopia with both eyes open.  Cardiovascular:     Rate and Rhythm: Normal rate and regular rhythm.     Pulses:          Radial pulses are 2+ on the left side.     Heart sounds: Normal heart sounds.  Pulmonary:     Effort: Pulmonary effort is normal.     Breath sounds: Normal breath sounds.  Abdominal:     General: There is no distension.  Skin:    General: Skin is warm and dry.  Neurological:     Mental Status: He is alert.     Comments: CN 3-12 grossly intact. 5/5 strength in all 4 extremities. Grossly normal sensation to light touch. Normal finger to nose.      (all labs ordered are  listed, but only abnormal results are displayed) Labs Reviewed  COMPREHENSIVE METABOLIC PANEL WITH GFR - Abnormal; Notable for the following components:      Result Value   CO2 20 (*)    Glucose, Bld 294 (*)    Calcium  8.8 (*)    All other components within normal limits  URINALYSIS, ROUTINE W REFLEX MICROSCOPIC - Abnormal; Notable for the following components:   Specific Gravity, Urine 1.038 (*)    Glucose, UA >=500 (*)    All other components within normal limits  CBG MONITORING, ED - Abnormal; Notable for the following components:   Glucose-Capillary 249 (*)    All other components within normal limits  CBC    EKG: EKG Interpretation Date/Time:  Monday November 14 2023 12:22:35 EDT Ventricular Rate:  68 PR Interval:  142 QRS Duration:  82 QT Interval:  414 QTC Calculation: 440 R Axis:   -36  Text Interpretation: Normal sinus rhythm Left axis deviation Left ventricular hypertrophy with repolarization abnormality ( R in aVL , Sokolow-Lyon , Cornell product ) similar to June 2025 Confirmed by Freddi Hamilton [phone]) on 11/14/2023 4:37:08 PM  Radiology: MR Brain W and Wo Contrast Result Date: 11/14/2023 EXAM: MRI BRAIN AND ORBITS WITH AND WITHOUT CONTRAST 11/14/2023 09:00:50 PM TECHNIQUE: Multiplanar multisequence MRI of the brain and orbits was performed with and without the administration of intravenous contrast. COMPARISON: None available. CLINICAL HISTORY: Neuro deficit, acute, stroke suspected. FINDINGS: BRAIN AND VENTRICLES: Small acute infarct within the right pons in the region of the paramedian pontine reticular formation. This is a likely cause of diplopia. 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 Multifocal hyperintense T2-weighted signal within the cerebral white matter, most commonly due to chronic small vessel disease. No acute intracranial hemorrhage. No mass or abnormal enhancement. No midline shift. No hydrocephalus. The sella is unremarkable. Normal flow voids.  ORBITS: Normal globes. Lenses are normally located. Symmetric caliber and signal of the optic nerves. Symmetric extraocular muscles. No orbital mass. No abnormal enhancement. SINUSES AND MASTOIDS: Clear. BONES AND SOFT TISSUES: Normal bone marrow signal. No acute soft tissue abnormality. IMPRESSION: 1. Small acute infarct within the right pons in the region of the paramedian pontine reticular formation, likely cause of diplopia. 2. 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 3. Multifocal hyperintense T2-weighted signal within the cerebral white matter, most commonly due to chronic small vessel disease. 4. Normal MRI of the orbits Electronically signed by: Franky Stanford MD 11/14/2023 10:03 PM EDT RP  Workstation: [id number]   MR ORBITS W WO CONTRAST Result Date: 11/14/2023 EXAM: MRI BRAIN AND ORBITS WITH AND WITHOUT CONTRAST 11/14/2023 09:00:50 PM TECHNIQUE: Multiplanar multisequence MRI of the brain and orbits was performed with and without the administration of intravenous contrast. COMPARISON: None available. CLINICAL HISTORY: Neuro deficit, acute, stroke suspected. FINDINGS: BRAIN AND VENTRICLES: Small acute infarct within the right pons in the region of the paramedian pontine reticular formation. This is a likely cause of diplopia. 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 Multifocal hyperintense T2-weighted signal within the cerebral white matter, most commonly due to chronic small vessel disease. No acute intracranial hemorrhage. No mass or abnormal enhancement. No midline shift. No hydrocephalus. The sella is unremarkable. Normal flow voids. ORBITS: Normal globes. Lenses are normally located. Symmetric caliber and signal of the optic nerves. Symmetric extraocular muscles. No orbital mass. No abnormal enhancement. SINUSES AND MASTOIDS: Clear. BONES AND SOFT TISSUES: Normal bone marrow signal. No acute soft tissue abnormality. IMPRESSION: 1. Small acute infarct within the right pons in the region of  the paramedian pontine reticular formation, likely cause of diplopia. 2. 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 3. Multifocal hyperintense T2-weighted signal within the cerebral white matter, most commonly due to chronic small vessel disease. 4. Normal MRI of the orbits Electronically signed by: Franky Stanford MD 11/14/2023 10:03 PM EDT RP Workstation: [id number]   CT Head Wo Contrast Result Date: 11/14/2023 CLINICAL DATA:  Acute neurologic deficit.  Suspected stroke. EXAM: CT HEAD WITHOUT CONTRAST TECHNIQUE: Contiguous axial images were obtained from the base of the skull through the vertex without intravenous contrast. RADIATION DOSE REDUCTION: This exam was performed according to the departmental dose-optimization program which includes automated exposure control, adjustment of the mA and/or kV according to patient size and/or use of iterative reconstruction technique. COMPARISON:  03/15/2019 FINDINGS: Brain: No evidence of intracranial hemorrhage, acute infarction, hydrocephalus, extra-axial collection, or mass lesion/mass effect. Mild chronic small vessel disease is again noted. Old lacunar infarct noted in the left pons. Vascular:  No hyperdense vessel or other acute findings. Skull: No evidence of fracture or other significant bone abnormality. Sinuses/Orbits:  No acute findings. Other: None. IMPRESSION: No acute intracranial abnormality. Mild chronic small vessel disease and old left pontine lacune. Electronically Signed   By: Norleen DELENA Kil M.D.   On: 11/14/2023 17:47     Procedures   Medications Ordered in the ED  lactated ringers  bolus 1,000 mL (1,000 mLs Intravenous Patient Refused/Not Given 11/14/23 1753)  gadobutrol  (GADAVIST ) 1 MMOL/ML injection 6 mL (6 mLs Intravenous Contrast Given 11/14/23 2147)    Clinical Course as of 11/14/23 2251  Mon Nov 14, 2023  1841 I discussed with on-call neurology, he recommends MRI brain and orbits with and without contrast to fully evaluate his  symptoms. [SG]    Clinical Course User Index [SG] Freddi Hamilton, MD                                 Medical Decision Making Amount and/or Complexity of Data Reviewed Labs: ordered.    Details: Hyperglycemia Radiology: ordered and independent interpretation performed.    Details: MRI with acute brainstem stroke.  ECG/medicine tests: ordered and independent interpretation performed.    Details: Sinus rhythm  Risk Prescription drug management. Decision regarding hospitalization.   Patient presentation is consistent with a brainstem stroke.  MRI has confirmed this.  He otherwise is currently stable.  Discussed with  Dr. Arora, he will see in consultation but given he is already on baby aspirin  and Plavix  he recommends no acute medication change.  Will need hospitalist admission.  Discussed with Dr. Silvester.     Final diagnoses:  Acute ischemic stroke [facility])    ED Discharge Orders     None          Freddi Hamilton, MD 11/14/23 [phone]

## 2023-11-14 NOTE — Consult Note (Signed)
 NEUROLOGY CONSULT NOTE   Date of service: November 14, 2023 Patient Name: Peter Bell MRN:  [id number] DOB:  03/25/1971 Chief Complaint: Difficulty swallowing, slurred speech, double vision Requesting Provider: Freddi Hamilton, MD  History of Present Illness  Peter Bell is a 53 y.o. male with hx of diabetes, hypertension, CAD, reports compliance to medications, presented to the ED for nearly 4 days worth of diplopia and dysphagia as well as slurred speech.  Started noticing the symptoms above at least 4 days ago.  Reports that when he looks with both eyes open, he sees double but when he covers either eye, he sees 1 object.  He is also been having trouble swallowing-was attempting to eat some [place] fries 4 days ago and it felt like he was choking on the food.  He reports compliance to medications.  Reports that he has been taking his aspirin , Plavix  as well as blood pressure medications as prescribed and does not report any missed doses. MRI of the brain was completed at [facility] that revealed a pontine infarct.  Neurology was consulted  LKW: Greater than 4 days ago-exact time unclear Modified rankin score: 0-Completely asymptomatic and back to baseline post- stroke IV Thrombolysis: Outside the window EVT: Outside the window  NIHSS components Score: Comment  1a Level of Conscious 0[x]  1[]  2[]  3[]      1b LOC Questions 0[x]  1[]  2[]       1c LOC Commands 0[x]  1[]  2[]       2 Best Gaze 0[]  1[x]  2[]       3 Visual 0[x]  1[]  2[]  3[]      4 Facial Palsy 0[]  1[x]  2[]  3[]      5a Motor Arm - left 0[x]  1[]  2[]  3[]  4[]  UN[]    5b Motor Arm - Right 0[x]  1[]  2[]  3[]  4[]  UN[]    6a Motor Leg - Left 0[x]  1[]  2[]  3[]  4[]  UN[]    6b Motor Leg - Right 0[x]  1[]  2[]  3[]  4[]  UN[]    7 Limb Ataxia 0[x]  1[]  2[]  UN[]      8 Sensory 0[x]  1[]  2[]  UN[]      9 Best Language 0[x]  1[]  2[]  3[]      10 Dysarthria 0[]  1[x]  2[]  UN[]      11 Extinct. and Inattention 0[x]  1[]  2[]       TOTAL: 3      ROS   Comprehensive ROS performed and pertinent positives documented in HPI   Past History   Past Medical History:  Diagnosis Date   Asthma    Diabetes mellitus without complication (HCC)    Eczema    GERD (gastroesophageal reflux disease)    Hypertension    Myocardial infarction [facility])    Sciatica     Past Surgical History:  Procedure Laterality Date   CORONARY STENT INTERVENTION N/A 09/26/2023   Procedure: CORONARY STENT INTERVENTION;  Surgeon: Swaziland, [name], MD;  Location: [facility] INVASIVE CV LAB;  Service: Cardiovascular;  Laterality: N/A;   HERNIA REPAIR     LEFT HEART CATH AND CORONARY ANGIOGRAPHY N/A 09/26/2023   Procedure: LEFT HEART CATH AND CORONARY ANGIOGRAPHY;  Surgeon: Swaziland, [name], MD;  Location: [facility] INVASIVE CV LAB;  Service: Cardiovascular;  Laterality: N/A;    Family History: Family History  Problem Relation Age of Onset   Cancer Mother    Cancer Father    Hypertension Sister    Heart murmur Sister    CVA Brother    CVA Brother    Hypertension Brother     Social  History  reports that he has never smoked. He has never used smokeless tobacco. He reports that he does not currently use alcohol. He reports that he does not use drugs. Denies tobacco alcohol or illicit drug use-never used  No Known Allergies  Medications   Current Facility-Administered Medications:    lactated ringers  bolus 1,000 mL, 1,000 mL, Intravenous, Once, Freddi Hamilton, MD  Current Outpatient Medications:    amLODipine  (NORVASC ) 10 MG tablet, Take 1 tablet (10 mg total) by mouth daily., Disp: 30 tablet, Rfl: 0   aspirin  EC 81 MG tablet, Take 1 tablet (81 mg total) by mouth daily. Swallow whole., Disp: 90 tablet, Rfl: 3   atorvastatin  (LIPITOR) 80 MG tablet, Take 1 tablet (80 mg total) by mouth daily., Disp: 30 tablet, Rfl: 0   clopidogrel  (PLAVIX ) 75 MG tablet, Take 1 tablet (75 mg total) by mouth daily., Disp: 90 tablet, Rfl: 3   empagliflozin  (JARDIANCE ) 10 MG TABS tablet, Take 1 tablet  (10 mg total) by mouth daily., Disp: 30 tablet, Rfl: 11   losartan  (COZAAR ) 50 MG tablet, Take 1 tablet (50 mg total) by mouth daily., Disp: 90 tablet, Rfl: 3   metFORMIN  (GLUCOPHAGE ) 500 MG tablet, Take 2 tablets (1,000 mg total) by mouth daily. Take with meal., Disp: 60 tablet, Rfl: 0   metoprolol  succinate (TOPROL  XL) 25 MG 24 hr tablet, Take 1 tablet (25 mg total) by mouth daily., Disp: 90 tablet, Rfl: 3   nitroGLYCERIN  (NITROSTAT ) 0.4 MG SL tablet, Place 1 tablet (0.4 mg total) under the tongue every 5 (five) minutes x 3 doses as needed for chest pain., Disp: 25 tablet, Rfl: 0   pantoprazole  (PROTONIX ) 20 MG tablet, Take 1 tablet (20 mg total) by mouth daily., Disp: 30 tablet, Rfl: 0  Vitals   Vitals:   11/29/23 1915 Nov 29, 2023 2000 Nov 29, 2023 2130 November 29, 2023 2230  BP: (!) 185/117 (!) 148/108 (!) 171/96 117/78  Pulse: (!) 51 (!) 45 (!) 55 60  Resp:  16  16  Temp:  98.6 F (37 C)    TempSrc:  Oral    SpO2: 100% 100% 96% 100%  Weight:      Height:        Body mass index is 21.8 kg/m.   Physical Exam  General: Well-developed well-nourished man in no apparent distress HEENT: Normocephalic, atraumatic CVS: Regular rhythm Respiratory: Breathing well saturating normally on room air Abdomen nondistended nontender Neurological exam Awake alert oriented x 3 Mild dysarthria No aphasia Cranial nerves: Pupils equal round react light, extraocular movements appear unhindered but he reports diplopia on looking to the right with both eyes open.  No diplopia when looking to the left.  Question mild right 6th nerve palsy, face with mild asymmetry with flattening of the right nasolabial fold.  Facial sensation intact.  Tongue and palate midline. Motor exam with no drift in any of the 4 extremities Sensation intact to light touch Coronation examination: Intact finger-nose-finger on the right, may be very subtle dysmetria on the left finger-nose-finger but grossly looks okay.  No dysmetria on the  legs   Labs/Imaging/Neurodiagnostic studies   CBC:  Recent Labs  Lab 11-29-23 1215  WBC 10.2  HGB 13.5  HCT 41.2  MCV 93.8  PLT 334   Basic Metabolic Panel:  Lab Results  Component Value Date   NA 138 2023-11-29   K 4.1 2023/11/29   CO2 20 (L) 29-Nov-2023   GLUCOSE 294 (H) 29-Nov-2023   BUN 8 November 29, 2023   CREATININE 1.01  11/14/2023   CALCIUM  8.8 (L) 11/14/2023   GFRNONAA >60 11/14/2023   GFRAA >60 08/15/2017   Lipid Panel:  Lab Results  Component Value Date   LDLCALC 113 (H) 09/26/2023   HgbA1c:  Lab Results  Component Value Date   HGBA1C 8.8 (H) 09/26/2023   Urine Drug Screen:     Component Value Date/Time   LABOPIA NONE DETECTED 09/25/2023 2006   COCAINSCRNUR NONE DETECTED 09/25/2023 2006   LABBENZ NONE DETECTED 09/25/2023 2006   AMPHETMU NONE DETECTED 09/25/2023 2006   THCU NONE DETECTED 09/25/2023 2006   LABBARB NONE DETECTED 09/25/2023 2006    Alcohol Level No results found for: [facility] INR  Lab Results  Component Value Date   INR 1.1 04/13/2020   APTT  Lab Results  Component Value Date   APTT 30 04/13/2020   Imaging personally reviewed CT Head without contrast Unremarkable  MRI Brain and orbits (Personally reviewed): Small acute infarct in the right pons in the region of paramedian pontine radicular formation, multiple old infarcts in the deep white matter.  Multiple hyperintense T2 weighted signal within the cerebral white matter most commonly noted due to small vessel ischemia.  Normal MRI of the orbits  ASSESSMENT   TRELON PLUSH is a 53 y.o. male past history of diabetes, hypertension, CAD presenting for at least 4 days worth of diplopia, dysphagia and dysarthria noted to have a small right paramedian pontine infarct.  Appearance of the stroke is consistent with small vessel etiology. 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 High intensity statin CT angiography head and neck 2D echo A1c Lipid panel Therapy assessments No need for permissive hypertension that she is at least 4 days from last known well. Goal blood pressure-normotension-avoid hypotension. Plan discussed with Dr. Freddi Stroke neurology to follow ______________________________________________________________________    Signed, Eligio Lav, MD [facility]

## 2023-11-15 ENCOUNTER — Observation Stay (HOSPITAL_COMMUNITY)

## 2023-11-15 ENCOUNTER — Other Ambulatory Visit: Payer: Self-pay

## 2023-11-15 ENCOUNTER — Other Ambulatory Visit (HOSPITAL_COMMUNITY): Payer: Self-pay

## 2023-11-15 ENCOUNTER — Telehealth (HOSPITAL_COMMUNITY): Payer: Self-pay | Admitting: Pharmacy Technician

## 2023-11-15 DIAGNOSIS — I6302 Cerebral infarction due to thrombosis of basilar artery: Secondary | ICD-10-CM | POA: Diagnosis not present

## 2023-11-15 DIAGNOSIS — I6389 Other cerebral infarction: Secondary | ICD-10-CM | POA: Diagnosis not present

## 2023-11-15 DIAGNOSIS — I6329 Cerebral infarction due to unspecified occlusion or stenosis of other precerebral arteries: Secondary | ICD-10-CM | POA: Diagnosis not present

## 2023-11-15 DIAGNOSIS — E1151 Type 2 diabetes mellitus with diabetic peripheral angiopathy without gangrene: Secondary | ICD-10-CM | POA: Diagnosis not present

## 2023-11-15 DIAGNOSIS — I6501 Occlusion and stenosis of right vertebral artery: Secondary | ICD-10-CM | POA: Diagnosis not present

## 2023-11-15 DIAGNOSIS — R29818 Other symptoms and signs involving the nervous system: Secondary | ICD-10-CM | POA: Diagnosis not present

## 2023-11-15 DIAGNOSIS — I2510 Atherosclerotic heart disease of native coronary artery without angina pectoris: Secondary | ICD-10-CM

## 2023-11-15 DIAGNOSIS — I6611 Occlusion and stenosis of right anterior cerebral artery: Secondary | ICD-10-CM | POA: Diagnosis not present

## 2023-11-15 DIAGNOSIS — I651 Occlusion and stenosis of basilar artery: Secondary | ICD-10-CM | POA: Diagnosis not present

## 2023-11-15 LAB — LIPID PANEL
Cholesterol: 96 mg/dL (ref 0–200)
HDL: 28 mg/dL — ABNORMAL LOW (ref >40)
LDL Cholesterol: 60 mg/dL (ref 0–99)
Total CHOL/HDL Ratio: 3.4 ratio
Triglycerides: 42 mg/dL (ref <150)
VLDL: 8 mg/dL (ref 0–40)

## 2023-11-15 LAB — RAPID URINE DRUG SCREEN, HOSP PERFORMED
Amphetamines: NOT DETECTED
Barbiturates: NOT DETECTED
Benzodiazepines: NOT DETECTED
Cocaine: NOT DETECTED
Opiates: NOT DETECTED
Tetrahydrocannabinol: NOT DETECTED

## 2023-11-15 LAB — ECHOCARDIOGRAM COMPLETE
Area-P 1/2: 1.93 cm2
Height: 65 in
S' Lateral: 3.4 cm
Weight: 2096 [oz_av]

## 2023-11-15 LAB — GLUCOSE, CAPILLARY
Glucose-Capillary: 121 mg/dL — ABNORMAL HIGH (ref 70–99)
Glucose-Capillary: 152 mg/dL — ABNORMAL HIGH (ref 70–99)
Glucose-Capillary: 160 mg/dL — ABNORMAL HIGH (ref 70–99)
Glucose-Capillary: 185 mg/dL — ABNORMAL HIGH (ref 70–99)
Glucose-Capillary: 225 mg/dL — ABNORMAL HIGH (ref 70–99)

## 2023-11-15 LAB — CBG MONITORING, ED: Glucose-Capillary: 201 mg/dL — ABNORMAL HIGH (ref 70–99)

## 2023-11-15 MED ORDER — IOHEXOL 350 MG/ML SOLN
75.0000 mL | Freq: Once | INTRAVENOUS | Status: AC | PRN
  Administered 2023-11-15: 75 mL via INTRAVENOUS

## 2023-11-15 NOTE — Assessment & Plan Note (Signed)
 Continue statin 80 mg daily continue Toprol  25 mg daily continue aspirin  81 mg daily and Plavix  75 mg daily

## 2023-11-15 NOTE — Progress Notes (Signed)
 Transported to radiology for swallow test in bed.

## 2023-11-15 NOTE — Evaluation (Addendum)
 Physical Therapy Evaluation Patient Details Name: Peter Bell MRN: [id number] DOB: 08/22/1970 Today's Date: 11/15/2023  History of Present Illness  Patient is a 53 year old male with diplopia, dysphagia, slurred speech. 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 PMH: DM2, CAD, spinal stenosis  Clinical Impression  PT evaluation completed. Patient reports chronic right leg pain with prolonged ambulation at baseline secondary to spinal stenosis. He has a cane in the room but reports being independent with short distance ambulation without the cane at baseline. He lives with his family, drives, and works.  Today the patient was wearing occluded glasses during session with reported improvement with vision issues. He has gait deficits noted with ambulation that patient reports is chronic. He is requesting a knee scooter to use for chronic right leg pain with prolonged ambulation and was not interested in other DME options. The patient reports he feels at or near his baseline level of functional independence and is hopeful to return home soon. Recommend PT follow up to maximize independence with new vision impairments and chronic mobility issues related to spinal stenosis.       If plan is discharge home, recommend the following: Assist for transportation   Can travel by private vehicle        Equipment Recommendations Other: None recommended at this time. Patient is requesting a knee scooter.   Recommendations for Other Services       Functional Status Assessment Patient has had a recent decline in their functional status and demonstrates the ability to make significant improvements in function in a reasonable and predictable amount of time.     Precautions / Restrictions Precautions Precautions: Fall Restrictions Weight Bearing Restrictions Per Provider Order: No      Mobility  Bed Mobility Overal bed mobility: Modified Independent              General bed mobility comments: increased time    Transfers Overall transfer level: Needs assistance Equipment used: None Transfers: Sit to/from Stand Sit to Stand: Supervision           General transfer comment: supervision for safety    Ambulation/Gait Ambulation/Gait assistance: Supervision Gait Distance (Feet): 40 Feet Assistive device: None Gait Pattern/deviations: Step-to pattern, Decreased stance time - right (decreased knee flexion right) Gait velocity: decreased     General Gait Details: patient reports chronic gait deficits with history of right leg cramping with prolonged walking related to spinal stenosis. he is requesting to try a knee scooter during the next visit. no loss of balance with ambulation in the room without device. he declined using the cane or a rolling walker (he is also not interested in a rollator).  Stairs            Wheelchair Mobility     Tilt Bed    Modified Rankin (Stroke Patients Only) Modified Rankin (Stroke Patients Only) Pre-Morbid Rankin Score: No significant disability Modified Rankin: No significant disability     Balance Overall balance assessment: Needs assistance Sitting-balance support: No upper extremity supported, Feet supported Sitting balance-Leahy Scale: Good     Standing balance support: No upper extremity supported Standing balance-Leahy Scale: Fair Standing balance comment: ocasionally reaching out for support of the bed initially with improving balance with increased ambulation distance                             Pertinent Vitals/Pain Pain Assessment Pain  Assessment: No/denies pain    Home Living Family/patient expects to be discharged to:: Private residence Living Arrangements: Spouse/significant other Available Help at Discharge: Family Type of Home: Apartment Home Access: Level entry     Alternate Level Stairs-Number of Steps: flight Home Layout: Two level;Bed/bath  upstairs Home Equipment: Cane - single point      Prior Function Prior Level of Function : Independent/Modified Independent;Driving             Mobility Comments: intermittent use of cane, limited distance ambulation due to chronic right leg pain (he reports from spinal stenosis) ADLs Comments: independent     Extremity/Trunk Assessment   Upper Extremity Assessment Upper Extremity Assessment: Right hand dominant;Defer to OT evaluation    Lower Extremity Assessment Lower Extremity Assessment: RLE deficits/detail;LLE deficits/detail RLE Deficits / Details: 5/5 hip flexion, dorsiflexion, plantarflexion, hip add/abd RLE Sensation: WNL LLE Deficits / Details: 5/5 hip flexion, dorsiflexion, plantarflexion, hip add/abd LLE Sensation: WNL       Communication   Communication Communication: No apparent difficulties    Cognition Arousal: Alert Behavior During Therapy: WFL for tasks assessed/performed   PT - Cognitive impairments: No apparent impairments                         Following commands: Intact       Cueing Cueing Techniques: Verbal cues     General Comments General comments (skin integrity, edema, etc.): patient wearing occluded glasses throughout session    Exercises     Assessment/Plan    PT Assessment Patient needs continued PT services  PT Problem List Decreased strength;Decreased range of motion;Decreased activity tolerance;Decreased balance;Decreased mobility       PT Treatment Interventions DME instruction;Gait training;Stair training;Functional mobility training;Therapeutic activities;Therapeutic exercise;Neuromuscular re-education;Balance training;Patient/family education    PT Goals (Current goals can be found in the Care Plan section)  Acute Rehab PT Goals Patient Stated Goal: to get a knee scooter and go home PT Goal Formulation: With patient Time For Goal Achievement: 11/29/23 Potential to Achieve Goals: Fair    Frequency Min  2X/week     Co-evaluation               AM-PAC PT 6 Clicks Mobility  Outcome Measure Help needed turning from your back to your side while in a flat bed without using bedrails?: None Help needed moving from lying on your back to sitting on the side of a flat bed without using bedrails?: None Help needed moving to and from a bed to a chair (including a wheelchair)?: A Little Help needed standing up from a chair using your arms (e.g., wheelchair or bedside chair)?: A Little Help needed to walk in hospital room?: A Little Help needed climbing 3-5 steps with a railing? : A Little 6 Click Score: 20    End of Session   Activity Tolerance: Patient tolerated treatment well Patient left: in bed;with call bell/phone within reach Nurse Communication: Mobility status PT Visit Diagnosis: Other abnormalities of gait and mobility (R26.89);Difficulty in walking, not elsewhere classified (R26.2)    Time: 1100-1119 PT Time Calculation (min) (ACUTE ONLY): 19 min   Charges:   PT Evaluation $PT Eval Moderate Complexity: 1 Mod   PT General Charges $$ ACUTE PT VISIT: 1 Visit         Randine Essex, PT, MPT   Randine LULLA Essex 11/15/2023, 11:59 AM

## 2023-11-15 NOTE — Assessment & Plan Note (Signed)
 Given that the stroke is completed when able resume Norvasc  10 mg daily and Toprol  25 mg daily avoid hypotension

## 2023-11-15 NOTE — Evaluation (Signed)
 Occupational Therapy Evaluation Patient Details Name: Peter Bell MRN: [id number] DOB: 04-15-1971 Today's Date: 11/15/2023   History of Present Illness   53 y.o. male presenting 7/21 with difficulty swallowing, diplopia, LUE numbness for 4 days. MRI brain with small acute infarct within the R pons in the region of the paramedian pontine reticular formation. PMH significant of DM2, CAD, HTN, spinal stenosis, asthma     Clinical Impressions PTA, pt reports living with girlfriend and being independent in ADL, IADL, working, driving. Some inconsistencies in report of symptoms and PLOF with pt initially reporting he has no AD but then cane in bed with him. Upon eval, pt with diplopia he initially reports resolved, but observed to hold L eye closed throughout session and does endorse diplopia and decr depth perception throughout. Pt reporting comes and goes. Pt needing up to min A for BADL at time of eval. Strength of Ues equal bilaterally. Pt needing CGA-min A for functional mobility, but reports his balance feels baseline as he has spinal stenosis and has had poor balance for some time. Denies falls. Will continue to follow acutely. Recommending OP OT to optimize safety and independence in ADL and IADL.      If plan is discharge home, recommend the following:   A little help with walking and/or transfers;A little help with bathing/dressing/bathroom;Assistance with cooking/housework;Assist for transportation;Help with stairs or ramp for entrance     Functional Status Assessment   Patient has had a recent decline in their functional status and demonstrates the ability to make significant improvements in function in a reasonable and predictable amount of time.     Equipment Recommendations   Tub/shower seat     Recommendations for Other Services         Precautions/Restrictions   Precautions Precautions: Fall Restrictions Weight Bearing Restrictions Per Provider Order:  No     Mobility Bed Mobility Overal bed mobility: Independent                  Transfers Overall transfer level: Needs assistance Equipment used: Straight cane Transfers: Sit to/from Stand Sit to Stand: Contact guard assist                  Balance Overall balance assessment: Needs assistance Sitting-balance support: No upper extremity supported, Feet supported Sitting balance-Leahy Scale: Good     Standing balance support: Single extremity supported, During functional activity Standing balance-Leahy Scale: Poor                             ADL either performed or assessed with clinical judgement   ADL Overall ADL's : Needs assistance/impaired Eating/Feeding: Modified independent;Bed level   Grooming: Contact guard assist;Standing   Upper Body Bathing: Set up;Sitting   Lower Body Bathing: Contact guard assist;Sit to/from stand   Upper Body Dressing : Set up;Sitting   Lower Body Dressing: Contact guard assist;Sit to/from stand   Toilet Transfer: Contact guard assist;Minimal assistance Toilet Transfer Details (indicate cue type and reason): intermittent assist for balance; cane         Functional mobility during ADLs: Contact guard assist;Minimal assistance;Cane       Vision Baseline Vision/History: 0 No visual deficits (supposed to wear glasses) Ability to See in Adequate Light: 0 Adequate Patient Visual Report: No change from baseline Vision Assessment?: Vision impaired- to be further tested in functional context;Yes Eye Alignment: Within Functional Limits Ocular Range of Motion: Within Functional Limits Tracking/Visual Pursuits:  Other (comment) (additional eye shifts occurred in L visual field) Visual Fields: No apparent deficits Diplopia Assessment: Objects split side to side;Present in near gaze;Present in far gaze;Only with right gaze (and at midline) Additional Comments: reports symptoms resolved  initially; pt then holding L  eye closed throughout session then with continued reports of diplopia     Perception         Praxis         Pertinent Vitals/Pain Pain Assessment Pain Assessment: No/denies pain     Extremity/Trunk Assessment Upper Extremity Assessment Upper Extremity Assessment: Right hand dominant;Overall WFL for tasks assessed (generally equal strength bilaterally, pt denies changes in sensation)           Communication Communication Communication: No apparent difficulties   Cognition Arousal: Alert Behavior During Therapy: WFL for tasks assessed/performed Cognition: Cognition impaired   Orientation impairments:  (day/date) Awareness: Online awareness impaired Memory impairment (select all impairments): Short-term memory Attention impairment (select first level of impairment): Selective attention Executive functioning impairment (select all impairments): Problem solving OT - Cognition Comments: pt with inconsistent report of history leading up to hgospitalization. pt denies diplopia on arrival but then keeps L eye closed and states he only gets double vision if both eyes are open                 Following commands: Intact       Cueing  General Comments   Cueing Techniques: Verbal cues;Gestural cues      Exercises     Shoulder Instructions      Home Living Family/patient expects to be discharged to:: Private residence Living Arrangements: Spouse/significant other (girlfriend) Available Help at Discharge: Family Type of Home: Apartment Home Access: Level entry     Home Layout: Two level;Bed/bath upstairs Alternate Level Stairs-Number of Steps: flight Alternate Level Stairs-Rails: Left Bathroom Shower/Tub: Chief Strategy Officer: Standard     Home Equipment: Cane - single point (reported none but had SPC in room he states is his)          Prior Functioning/Environment Prior Level of Function : Independent/Modified  Independent;Working/employed;Driving             Mobility Comments: intermittent use of cane ADLs Comments: cleans [employer]    OT Problem List: Decreased strength;Decreased activity tolerance;Impaired balance (sitting and/or standing);Impaired vision/perception;Decreased cognition;Decreased safety awareness;Decreased knowledge of use of DME or AE   OT Treatment/Interventions: Self-care/ADL training;Therapeutic exercise;DME and/or AE instruction;Therapeutic activities;Patient/family education;Balance training      OT Goals(Current goals can be found in the care plan section)   Acute Rehab OT Goals Patient Stated Goal: get better OT Goal Formulation: With patient Time For Goal Achievement: 11/29/23 Potential to Achieve Goals: Good   OT Frequency:  Min 2X/week    Co-evaluation              AM-PAC OT 6 Clicks Daily Activity     Outcome Measure Help from another person eating meals?: None Help from another person taking care of personal grooming?: A Little Help from another person toileting, which includes using toliet, bedpan, or urinal?: A Little Help from another person bathing (including washing, rinsing, drying)?: A Little Help from another person to put on and taking off regular upper body clothing?: A Little Help from another person to put on and taking off regular lower body clothing?: A Little 6 Click Score: 19   End of Session Equipment Utilized During Treatment: Gait belt;Other (comment) [facility]) Nurse Communication: Mobility status  Activity Tolerance: Patient tolerated treatment well Patient left: in bed;with call bell/phone within reach  OT Visit Diagnosis: Unsteadiness on feet (R26.81);Muscle weakness (generalized) (M62.81);Low vision, both eyes (H54.2);Other symptoms and signs involving cognitive function                Time: 9243-9173 OT Time Calculation (min): 37 min Charges:  OT General Charges $OT Visit: 1 Visit OT Evaluation $OT Eval Low  Complexity: 1 Low OT Treatments $Self Care/Home Management : 8-22 mins  Elma JONETTA Lebron FREDERICK, OTR/L [facility] Acute Rehabilitation Office: [phone]   Elma JONETTA Lebron 11/15/2023, 10:01 AM

## 2023-11-15 NOTE — Assessment & Plan Note (Signed)
 Patient has chronic back pain which interferes with his walking

## 2023-11-15 NOTE — Progress Notes (Signed)
 Assumed pt care from RN Tausi, re assessed and re orient pt.

## 2023-11-15 NOTE — ED Notes (Signed)
 SLP at bedside.

## 2023-11-15 NOTE — Progress Notes (Signed)
 Occupational Therapy Treatment Patient Details Name: Peter Bell MRN: [id number] DOB: [birth year] Today's Date: 11/15/2023   History of present illness 52 y.o. male presenting 7/21 with difficulty swallowing, diplopia, LUE numbness for 4 days. MRI brain with small acute infarct within the R pons in the region of the paramedian pontine reticular formation. PMH significant of DM2, CAD, HTN, spinal stenosis, asthma   OT comments  Return to provide occlusion glasses to reduce experience of diplopia. Pt needing repeated education and cueing for carryover. Taped L lens of glasses as pt with naturally closing L eye last session. Pt educated regarding wear schedule, exercises, and progression to remove tape. Will continue to follow.       If plan is discharge home, recommend the following:  A little help with walking and/or transfers;A little help with bathing/dressing/bathroom;Assistance with cooking/housework;Assist for transportation;Help with stairs or ramp for entrance   Equipment Recommendations  Tub/shower seat    Recommendations for Other Services      Precautions / Restrictions Precautions Precautions: Fall Restrictions Weight Bearing Restrictions Per Provider Order: No       Mobility Bed Mobility Overal bed mobility: Independent                  Transfers Overall transfer level: Needs assistance Equipment used: Straight cane Transfers: Sit to/from Stand Sit to Stand: Contact guard assist                 Balance Overall balance assessment: Needs assistance Sitting-balance support: No upper extremity supported, Feet supported Sitting balance-Leahy Scale: Good     Standing balance support: Single extremity supported, During functional activity Standing balance-Leahy Scale: Poor                             ADL either performed or assessed with clinical judgement   ADL Overall ADL's : Needs assistance/impaired Eating/Feeding: Modified  independent;Bed level   Grooming: Contact guard assist;Standing   Upper Body Bathing: Set up;Sitting   Lower Body Bathing: Contact guard assist;Sit to/from stand   Upper Body Dressing : Set up;Sitting   Lower Body Dressing: Contact guard assist;Sit to/from stand   Toilet Transfer: Contact guard assist;Minimal assistance Toilet Transfer Details (indicate cue type and reason): intermittent assist for balance; cane         Functional mobility during ADLs: Contact guard assist;Minimal assistance;Cane General ADL Comments: focus session on diplopia    Extremity/Trunk Assessment Upper Extremity Assessment Upper Extremity Assessment: Right hand dominant            Vision Baseline Vision/History: 0 No visual deficits (supposed to wear glasses) Ability to See in Adequate Light: 0 Adequate Patient Visual Report: No change from baseline Vision Assessment?: Vision impaired- to be further tested in functional context;Yes Eye Alignment: Within Functional Limits Ocular Range of Motion: Within Functional Limits Tracking/Visual Pursuits: Other (comment) (additional eye shifts occurred in L visual field) Visual Fields: No apparent deficits Diplopia Assessment: Objects split side to side;Present in near gaze;Present in far gaze;Only with right gaze (and at midline) Additional Comments: focus session on provision of occlusive glasses to reduce experience of diplopia. pt needing repeated instructions for how and when to remove portions of tape and exercises to do to improve diplopia as well as wear schedule for glasses. Pt needing ~1/2 of lens taped to relieve pt of diplopia.   Perception     Praxis     Communication Communication Communication: No apparent difficulties  Cognition Arousal: Alert Behavior During Therapy: WFL for tasks assessed/performed Cognition: Cognition impaired   Orientation impairments:  (day/date) Awareness: Online awareness impaired Memory impairment  (select all impairments): Short-term memory Attention impairment (select first level of impairment): Selective attention Executive functioning impairment (select all impairments): Problem solving OT - Cognition Comments: difficulty with recall of information from evaluation in regard to expectations for stroke work up as well as education provided regarding diplopia                 Following commands: Intact        Cueing   Cueing Techniques: Verbal cues, Gestural cues  Exercises Exercises: Other exercises Other Exercises Other Exercises: visual tracking, saccades Other Exercises: encouraged to wear glasses as much as pt can tolerate esp during self feeding, ADL    Shoulder Instructions       General Comments      Pertinent Vitals/ Pain       Pain Assessment Pain Assessment: No/denies pain  Home Living Family/patient expects to be discharged to:: Private residence Living Arrangements: Spouse/significant other (girlfriend) Available Help at Discharge: Family Type of Home: Apartment Home Access: Level entry     Home Layout: Two level;Bed/bath upstairs Alternate Level Stairs-Number of Steps: flight Alternate Level Stairs-Rails: Left Bathroom Shower/Tub: Chief Strategy Officer: Standard     Home Equipment: Cane - single point (reported none but had SPC in room he states is his)          Prior Functioning/Environment              Frequency  Min 2X/week        Progress Toward Goals  OT Goals(current goals can now be found in the care plan section)  Progress towards OT goals: Progressing toward goals  Acute Rehab OT Goals Patient Stated Goal: get better OT Goal Formulation: With patient Time For Goal Achievement: 11/29/23 Potential to Achieve Goals: Good  Plan      Co-evaluation                 AM-PAC OT 6 Clicks Daily Activity     Outcome Measure   Help from another person eating meals?: None Help from another person  taking care of personal grooming?: A Little Help from another person toileting, which includes using toliet, bedpan, or urinal?: A Little Help from another person bathing (including washing, rinsing, drying)?: A Little Help from another person to put on and taking off regular upper body clothing?: A Little Help from another person to put on and taking off regular lower body clothing?: A Little 6 Click Score: 19    End of Session Equipment Utilized During Treatment: Other (comment) (occlusive glasses)  OT Visit Diagnosis: Unsteadiness on feet (R26.81);Muscle weakness (generalized) (M62.81);Low vision, both eyes (H54.2);Other symptoms and signs involving cognitive function   Activity Tolerance Patient tolerated treatment well   Patient Left in bed;with call bell/phone within reach   Nurse Communication Mobility status        Time: 9064-9052 OT Time Calculation (min): 12 min  Charges: OT General Charges $OT Visit: 1 Visit OT Evaluation $OT Eval Low Complexity: 1 Low OT Treatments $Self Care/Home Management : 8-22 mins $Therapeutic Activity: 8-22 mins  Elma JONETTA Penner, OTD, OTR/L [facility] Acute Rehabilitation Office: [phone]   Elma JONETTA Penner 11/15/2023, 10:09 AM

## 2023-11-15 NOTE — Progress Notes (Signed)
 STROKE TEAM PROGRESS NOTE   SUBJECTIVE (INTERVAL HISTORY) His RN is at the bedside.  Overall his condition is stable. He still has some dysphagia but has passed swallow, still had diplopia when gaze to the right. On eyeglass occluder now. Mild R facial droop, hard to explain.    OBJECTIVE Temp:  [97.5 F (36.4 C)-98.6 F (37 C)] 97.5 F (36.4 C) (07/22 0931) Pulse Rate:  [45-86] 55 (07/22 0931) Cardiac Rhythm: Normal sinus rhythm (07/22 0000) Resp:  [15-23] 15 (07/22 0931) BP: (117-193)/(65-117) 160/94 (07/22 0931) SpO2:  [96 %-100 %] 100 % (07/22 0931) Weight:  [59.4 kg] 59.4 kg (07/21 1155)  Recent Labs  Lab 11/14/23 1217 11/15/23 1050  GLUCAP 249* 201*   Recent Labs  Lab 11/14/23 1215  NA 138  K 4.1  CL 107  CO2 20*  GLUCOSE 294*  BUN 8  CREATININE 1.01  CALCIUM  8.8*   Recent Labs  Lab 11/14/23 1215  AST 21  ALT 14  ALKPHOS 103  BILITOT 0.9  PROT 7.4  ALBUMIN 3.6   Recent Labs  Lab 11/14/23 1215  WBC 10.2  HGB 13.5  HCT 41.2  MCV 93.8  PLT 334   No results for input(s): CKTOTAL, CKMB, CKMBINDEX, TROPONINI in the last 168 hours. No results for input(s): LABPROT, INR in the last 72 hours. Recent Labs    11/14/23 1516  COLORURINE YELLOW  LABSPEC 1.038*  PHURINE 5.0  GLUCOSEU >=500*  HGBUR NEGATIVE  BILIRUBINUR NEGATIVE  KETONESUR NEGATIVE  PROTEINUR NEGATIVE  NITRITE NEGATIVE  LEUKOCYTESUR NEGATIVE       Component Value Date/Time   CHOL 96 11/15/2023 0443   CHOL 108 03/31/2023 1001   TRIG 42 11/15/2023 0443   HDL 28 (L) 11/15/2023 0443   HDL 29 (L) 03/31/2023 1001   CHOLHDL 3.4 11/15/2023 0443   VLDL 8 11/15/2023 0443   LDLCALC 60 11/15/2023 0443   LDLCALC 66 03/31/2023 1001   Lab Results  Component Value Date   HGBA1C 8.8 (H) 09/26/2023      Component Value Date/Time   LABOPIA NONE DETECTED 09/25/2023 2006   COCAINSCRNUR NONE DETECTED 09/25/2023 2006   LABBENZ NONE DETECTED 09/25/2023 2006   AMPHETMU NONE  DETECTED 09/25/2023 2006   THCU NONE DETECTED 09/25/2023 2006   LABBARB NONE DETECTED 09/25/2023 2006    No results for input(s): ETH in the last 168 hours.  I have personally reviewed the radiological images below and agree with the radiology interpretations.  CT ANGIO HEAD NECK W WO CM Result Date: 11/15/2023 EXAM: CTA HEAD AND NECK WITH AND WITHOUT 11/15/2023 12:32:50 AM TECHNIQUE: CTA of the head and neck was performed with and without the administration of intravenous contrast. Multiplanar 2D and/or 3D reformatted images are provided for review. Automated exposure control, iterative reconstruction, and/or weight based adjustment of the mA/kV was utilized to reduce the radiation dose to as low as reasonably achievable. Stenosis of the internal carotid arteries measured using NASCET criteria. COMPARISON: Brain MRI 11/14/2023 CLINICAL HISTORY: Neuro deficit, acute, stroke suspected. Numbness; Diplopia. FINDINGS: CTA NECK: AORTIC ARCH AND ARCH VESSELS: No dissection or arterial injury. No significant stenosis of the brachiocephalic or subclavian arteries. CERVICAL CAROTID ARTERIES: No dissection, arterial injury, or hemodynamically significant stenosis by NASCET criteria. CERVICAL VERTEBRAL ARTERIES: Mild narrowing of the right vertebral artery origin. The left vertebral artery is diminutive along its entire course and terminates in PICA. LUNGS AND MEDIASTINUM: Unremarkable. SOFT TISSUES: No acute abnormality. BONES: No acute abnormality. CTA HEAD: ANTERIOR CIRCULATION:  Moderate stenosis of the right anterior cerebral artery A2 segment. No significant stenosis of the internal carotid arteries. No significant stenosis of the middle cerebral arteries. No aneurysm. POSTERIOR CIRCULATION: Mild stenosis of the proximal basilar artery. The left PCA P1 segment is occluded. There is distal reconstitution. No significant stenosis of the vertebral arteries. No aneurysm. OTHER: No dural venous sinus thrombosis on  this non-dedicated study. IMPRESSION: 1. 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 2. Mild stenosis of the proximal basilar artery. 3. Moderate stenosis of the right anterior cerebral artery A2 segment. Electronically signed by: Franky Stanford MD 11/15/2023 12:48 AM EDT RP Workstation: [id number]   DG CHEST PORT 1 VIEW Result Date: 11/14/2023 CLINICAL DATA:  Stroke EXAM: PORTABLE CHEST 1 VIEW COMPARISON:  09/25/2023 FINDINGS: The heart size and mediastinal contours are within normal limits. Both lungs are clear. The visualized skeletal structures are unremarkable. IMPRESSION: No active disease. Electronically Signed   By: Luke Bun M.D.   On: 11/14/2023 23:55   MR Brain W and Wo Contrast Result Date: 11/14/2023 EXAM: MRI BRAIN AND ORBITS WITH AND WITHOUT CONTRAST 11/14/2023 09:00:50 PM TECHNIQUE: Multiplanar multisequence MRI of the brain and orbits was performed with and without the administration of intravenous contrast. COMPARISON: None available. CLINICAL HISTORY: Neuro deficit, acute, stroke suspected. FINDINGS: BRAIN AND VENTRICLES: Small acute infarct within the right pons in the region of the paramedian pontine reticular formation. This is a likely cause of diplopia. 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 Multifocal hyperintense T2-weighted signal within the cerebral white matter, most commonly due to chronic small vessel disease. No acute intracranial hemorrhage. No mass or abnormal enhancement. No midline shift. No hydrocephalus. The sella is unremarkable. Normal flow voids. ORBITS: Normal globes. Lenses are normally located. Symmetric caliber and signal of the optic nerves. Symmetric extraocular muscles. No orbital mass. No abnormal enhancement. SINUSES AND MASTOIDS: Clear. BONES AND SOFT TISSUES: Normal bone marrow signal. No acute soft tissue abnormality. IMPRESSION: 1. Small acute infarct within the right pons in the region of the paramedian pontine reticular formation,  likely cause of diplopia. 2. 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 3. Multifocal hyperintense T2-weighted signal within the cerebral white matter, most commonly due to chronic small vessel disease. 4. Normal MRI of the orbits Electronically signed by: Franky Stanford MD 11/14/2023 10:03 PM EDT RP Workstation: [id number]   MR ORBITS W WO CONTRAST Result Date: 11/14/2023 EXAM: MRI BRAIN AND ORBITS WITH AND WITHOUT CONTRAST 11/14/2023 09:00:50 PM TECHNIQUE: Multiplanar multisequence MRI of the brain and orbits was performed with and without the administration of intravenous contrast. COMPARISON: None available. CLINICAL HISTORY: Neuro deficit, acute, stroke suspected. FINDINGS: BRAIN AND VENTRICLES: Small acute infarct within the right pons in the region of the paramedian pontine reticular formation. This is a likely cause of diplopia. 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 Multifocal hyperintense T2-weighted signal within the cerebral white matter, most commonly due to chronic small vessel disease. No acute intracranial hemorrhage. No mass or abnormal enhancement. No midline shift. No hydrocephalus. The sella is unremarkable. Normal flow voids. ORBITS: Normal globes. Lenses are normally located. Symmetric caliber and signal of the optic nerves. Symmetric extraocular muscles. No orbital mass. No abnormal enhancement. SINUSES AND MASTOIDS: Clear. BONES AND SOFT TISSUES: Normal bone marrow signal. No acute soft tissue abnormality. IMPRESSION: 1. Small acute infarct within the right pons in the region of the paramedian pontine reticular formation, likely cause of diplopia. 2. Multiple old infarcts of the deep  white matter. 3. Multifocal hyperintense T2-weighted signal within the cerebral white matter, most commonly due to chronic small vessel disease. 4. Normal MRI of the orbits Electronically signed by: Franky Stanford MD 11/14/2023 10:03 PM EDT RP Workstation: [id number]   CT Head Wo Contrast Result  Date: 11/14/2023 CLINICAL DATA:  Acute neurologic deficit.  Suspected stroke. EXAM: CT HEAD WITHOUT CONTRAST TECHNIQUE: Contiguous axial images were obtained from the base of the skull through the vertex without intravenous contrast. RADIATION DOSE REDUCTION: This exam was performed according to the departmental dose-optimization program which includes automated exposure control, adjustment of the mA and/or kV according to patient size and/or use of iterative reconstruction technique. COMPARISON:  03/15/2019 FINDINGS: Brain: No evidence of intracranial hemorrhage, acute infarction, hydrocephalus, extra-axial collection, or mass lesion/mass effect. Mild chronic small vessel disease is again noted. Old lacunar infarct noted in the left pons. Vascular:  No hyperdense vessel or other acute findings. Skull: No evidence of fracture or other significant bone abnormality. Sinuses/Orbits:  No acute findings. Other: None. IMPRESSION: No acute intracranial abnormality. Mild chronic small vessel disease and old left pontine lacune. Electronically Signed   By: Norleen DELENA Kil M.D.   On: 11/14/2023 17:47     PHYSICAL EXAM  Temp:  [97.5 F (36.4 C)-98.6 F (37 C)] 97.5 F (36.4 C) (07/22 0931) Pulse Rate:  [45-86] 55 (07/22 0931) Resp:  [15-23] 15 (07/22 0931) BP: (117-193)/(65-117) 160/94 (07/22 0931) SpO2:  [96 %-100 %] 100 % (07/22 0931) Weight:  [59.4 kg] 59.4 kg (07/21 1155)  General - Well nourished, well developed, in no apparent distress.  Ophthalmologic - fundi not visualized due to noncooperation.  Cardiovascular - Regular rhythm and rate.  Mental Status -  Level of arousal and orientation to time, place, and person were intact. Language including expression, naming, repetition, comprehension was assessed and found intact. Fund of Knowledge was assessed and was intact.  Cranial Nerves II - XII - II - Visual field intact OU. III, IV, VI - Extraocular movements intact. No dysconjugate seen but  complaining of right gaze horizontal diplopia V - Facial sensation intact bilaterally. VII - mild facial droop on the right, symmetrical eye closure and raising up eyebrows.  VIII - Hearing & vestibular intact bilaterally. X - Palate elevates symmetrically. XI - Chin turning & shoulder shrug intact bilaterally. XII - Tongue protrusion intact.  Motor Strength - The patient's strength was normal in all extremities and pronator drift was absent.  Bulk was normal and fasciculations were absent.   Motor Tone - Muscle tone was assessed at the neck and appendages and was normal.  Reflexes - The patient's reflexes were symmetrical in all extremities and he had no pathological reflexes.  Sensory - Light touch, temperature/pinprick were assessed and were symmetrical.    Coordination - The patient had normal movements in the hands and feet with no ataxia or dysmetria.  Tremor was absent.  Gait and Station - deferred.   ASSESSMENT/PLAN Mr. Peter Bell is a 53 y.o. male with history of HTN, DM, recent CAD s/p stenting on DAPT admitted for slurry speech, diplopia and dysphagia for 4 days. No TNK given due to outside window.    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 Will check P2Y12 tonight to decide further DAPT regimen Patient counseled to be compliant with his antithrombotic medications Ongoing aggressive stroke risk factor management Therapy recommendations:  pending Disposition:  pending  Diabetes HgbA1c 8.8 goal <  7.0 Uncontrolled CBG monitoring SSI DM education and close PCP follow up  CAD 09/2023 LHC showed 2 vessel CAD S/p PCI with cardiac stenting On DAPT, reported compliance  Hypertension Stable on the high end Outside permissive hypertension window On norvasc  10 and metorpolol Gradually normalize BP Long term BP goal normotensive  Hyperlipidemia Home meds:  lipitor 80  LDL 113->60, goal < 70 Now continue home lipitor Continue statin at discharge  Other Stroke Risk Factors   Other Active Problems   Hospital day # 0  Discussed with Dr. Royal Ary Cummins, MD PhD Stroke Neurology 11/15/2023 11:02 AM    To contact Stroke Continuity provider, please refer to [email or web address]. After hours, contact General Neurology

## 2023-11-15 NOTE — Evaluation (Signed)
 Clinical/Bedside Swallow Evaluation Patient Details  Name: Peter Bell MRN: [id number] Date of Birth: 1970-12-03  Today's Date: 11/15/2023 Time: SLP Start Time (ACUTE ONLY): 1322 SLP Stop Time (ACUTE ONLY): 1338 SLP Time Calculation (min) (ACUTE ONLY): 16 min  Past Medical History:  Past Medical History:  Diagnosis Date   Asthma    Diabetes mellitus without complication (HCC)    Eczema    GERD (gastroesophageal reflux disease)    Hypertension    Myocardial infarction [facility])    Sciatica    Past Surgical History:  Past Surgical History:  Procedure Laterality Date   CORONARY STENT INTERVENTION N/A 09/26/2023   Procedure: CORONARY STENT INTERVENTION;  Surgeon: Swaziland, [name], MD;  Location: [facility] INVASIVE CV LAB;  Service: Cardiovascular;  Laterality: N/A;   HERNIA REPAIR     LEFT HEART CATH AND CORONARY ANGIOGRAPHY N/A 09/26/2023   Procedure: LEFT HEART CATH AND CORONARY ANGIOGRAPHY;  Surgeon: Swaziland, [name], MD;  Location: [facility] INVASIVE CV LAB;  Service: Cardiovascular;  Laterality: N/A;   HPI:  Patient is a 53 year old male with diplopia, dysphagia, slurred speech. 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 PMH: DM2, CAD, spinal stenosis    Assessment / Plan / Recommendation  Clinical Impression  Patient presents with c/o mild dysphagia characterized by complaints of both solid po globus and solid bolus getting stuck just passed tongue, clearing with liquid wash. Right sided facial assmetry noted which patient states is not new however reports that girlfriend noticed at onset of other symptoms. In addition, although no s/s of aspiration noted at bedside during exam, RN reports that patient was coughing earlier with po intake and following exam, patient noted to be coughing from outside the room as well. Decision made to proceed with MBS given inconsistent abilities and location of CVA. Will plan for MBS at 1500 today. SLP Visit Diagnosis:  Dysphagia, unspecified (R13.10)       Diet Recommendation Regular;Thin liquid    Liquid Administration via: Straw;Cup Medication Administration: Whole meds with liquid Supervision: Patient able to self feed Compensations: Slow rate;Small sips/bites Postural Changes: Seated upright at 90 degrees    Other  Recommendations Oral Care Recommendations: Oral care BID                  Prognosis Prognosis for improved oropharyngeal function: Good      Swallow Study   General HPI: Patient is a 53 year old male with diplopia, dysphagia, slurred speech. 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 PMH: DM2, CAD, spinal stenosis Type of Study: Bedside Swallow Evaluation Previous Swallow Assessment: none Diet Prior to this Study: Regular;Thin liquids (Level 0) Temperature Spikes Noted: No Respiratory Status: Room air History of Recent Intubation: No Behavior/Cognition: Alert;Cooperative;Pleasant mood Oral Cavity Assessment: Within Functional Limits Oral Care Completed by SLP: No Oral Cavity - Dentition: Adequate natural dentition Vision: Functional for self-feeding Self-Feeding Abilities: Able to feed self Patient Positioning: Upright in bed Baseline Vocal Quality: Normal Volitional Cough: Strong Volitional Swallow: Able to elicit    Oral/Motor/Sensory Function Overall Oral Motor/Sensory Function: Mild impairment Facial ROM: Within Functional Limits Facial Symmetry: Abnormal symmetry right;Suspected CN VII (facial) dysfunction Facial Strength: Within Functional Limits Facial Sensation: Within Functional Limits Lingual ROM: Within Functional Limits Lingual Symmetry: Within Functional Limits Lingual Strength: Within Functional Limits Lingual Sensation: Within Functional Limits Velum: Within Functional Limits Mandible: Within Functional Limits   [place]  chips: Not tested   Thin Liquid Thin Liquid: Within functional  limits Presentation: Cup;Self Fed;Straw    Nectar Thick Nectar Thick Liquid: Not tested   Honey Thick Honey Thick Liquid: Not tested   Puree Puree: Within functional limits Presentation: Spoon;Self Fed   Solid     Solid: Impaired Presentation: Spoon;Self Fed Oral Phase Functional Implications:  (c/o bolus not going down and describes it stuck just passed tongue. cleared with liquid wash)     Ilyaas Musto MA, CCC-SLP  Bode Pieper Meryl 11/15/2023,1:51 PM

## 2023-11-15 NOTE — Progress Notes (Signed)
 [facility] ROUNDING NOTE DAXEN LANUM FMW:[id number]  DOB: 04/20/1971  DOA: 11/14/2023  PCP: Knute Thersia Bitters, FNP  11/15/2023,7:22 AM  LOS: 0 days    Code Status: Full code     from: Home current Dispo: Unclear    53 year old black male HTN spinal stenosis poor control of DM TY 2 with A1c 8.8 Hospitalization by cardiology 6/1 through 09/27/2023 with NSTEMI cardiac cath two-vessel disease with PCI proximal LAD told to continue DAPT for 1 year 6/27 follow-up visit cardiology-unclear if he was taking his Effient -(affordability) switched to Plavix  and was not needing a load--- was changed to Toprol  25 losartan  increased to 50 and told to follow-up with BMP in 1 week plan was to get Jardiance  as an outpatient-last EF grade 1 DD 55-60%  7/ 18 visited family medicine with dysphagia-no diplopia reported-came to ED 7/21 with 4 days of diplopia-reported compliance with aspirin  Plavix  etc. MRI brain showed pontine infarct  Plan  Pontine stroke on admission Lipid panel done-A1c recently 8.8-complete workup and follow-up with neurology later this morning Outpatient compliance seems to been an issue on Effient  which is why patient was loaded with Plavix  at 627 cardiology visit-affordability needs to be reinforced with patient Might choose Brillinta per Neuro---defer to them Complete stroke workup with therapy evaluations  Recent NSTEMI with PCI Given recent history and completed stroke over 4 days ago continuing amlodipine  10 Toprol -XL 25 and avoid hypotension, keep saline 75 cc/H  DM TY 2 A1c recently 8.8 Hyperlipidemia  Data Reviewed:  HDL 28 LDL 60 total cholesterol 96   DVT prophylaxis: SCD  Status is: Observation The patient remains OBS appropriate and will d/c before 2 midnights.     Subjective:      Objective + exam Vitals:   11/15/23 0500 11/15/23 0515 11/15/23 0530 11/15/23 0630  BP: 130/68 132/67 139/79 123/65  Pulse: (!) 47 (!) 51 (!) 52 (!) 50  Resp: 16 15 15 18   Temp:       TempSrc:      SpO2: 100% 100% 100% 98%  Weight:      Height:       Filed Weights   11/14/23 1155  Weight: 59.4 kg     Examination: Well no distress Some dysarthria Finger-nose-finger intact, reflexes 2/3 External ocular movements intact Neck soft supple Power 5/5 no focal deficit S1-S2 bradycardic in the 50s Abdomen soft no rebound    Scheduled Meds:   stroke: early stages of recovery book   Does not apply Once   amLODipine   10 mg Oral Daily   aspirin  EC  81 mg Oral Daily   atorvastatin   80 mg Oral Daily   clopidogrel   75 mg Oral Daily   insulin  aspart  0-9 Units Subcutaneous Q4H   metoprolol  succinate  25 mg Oral Daily   pantoprazole   20 mg Oral Daily   Continuous Infusions:  sodium chloride  75 mL/hr at 11/15/23 0050   lactated ringers       Time 46  Jai-Gurmukh Jevan Gaunt, MD  [facility]

## 2023-11-15 NOTE — Plan of Care (Signed)
 Denies pain.  Said he just wants to be left alone.  Said every time he gets comfortable, someone comes and awakens him.  I reminded him that we do have to check on him and assess him because he is here for stroke and we will not know if he is ok if we do not check on him.  He said he understood but make it quick.    Problem: Skin Integrity: Goal: Risk for impaired skin integrity will decrease Outcome: Progressing   Problem: Coping: Goal: Will verbalize positive feelings about self Outcome: Progressing   Problem: Nutrition: Goal: Risk of aspiration will decrease Outcome: Progressing   Problem: Activity: Goal: Risk for activity intolerance will decrease Outcome: Progressing   Problem: Nutrition: Goal: Adequate nutrition will be maintained Outcome: Progressing   Problem: Education: Goal: Knowledge of secondary prevention will improve (MUST DOCUMENT ALL) Outcome: Progressing   Problem: Education: Goal: Knowledge of patient specific risk factors will improve (DELETE if not current risk factor) Outcome: Progressing

## 2023-11-15 NOTE — Assessment & Plan Note (Signed)
 Order sliding scale hold p.o. medications

## 2023-11-15 NOTE — Telephone Encounter (Signed)
 Patient Product/process development scientist completed.    The patient is insured through [employer].     Ran test claim for Brilinta 90 mg and the current 30 day co-pay is $4.00.  Ran test claim for ticagrelor (Brilinta) 90 mg and Product Not Covered  This test claim was processed through [employer]- copay amounts may vary at other pharmacies due to [employer], or as the patient moves through the different stages of their insurance plan.     Reyes Sharps, CPHT Pharmacy Technician III Certified Patient Advocate [facility] Patient Advocate Team Direct Number: [phone]  Fax: [phone]

## 2023-11-15 NOTE — Assessment & Plan Note (Signed)
-   will admit based on TIA/CVA protocol,         Monitor on Tele        MRI   Resulted - showing acute ischemic CVA        CTA orderd       Echo to evaluate for possible embolic source,        obtain cardiac enzymes,  ECG,   Lipid panel, TSH.        Order PT/OT evaluation.        keep nothing by mouth until passes swallow eval  will need Speech pathology evaluation Patient states he is still having trouble swallowing       Will make sure patient is on antiplatelet ASA 81   Plavix  agent and statin          Neurology consulted Have seen pt in ER  will  continue to follow in AM

## 2023-11-15 NOTE — Progress Notes (Signed)
 Modified Barium Swallow Study  Patient Details  Name: Peter Bell MRN: [id number] Date of Birth: [birth year]  Today's Date: 11/15/2023  Modified Barium Swallow completed.  Full report located under Chart Review in the Imaging Section.  History of Present Illness Patient is a 53 year old male with diplopia, dysphagia, slurred speech. 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 PMH: DM2, CAD, spinal stenosis   Clinical Impression Patient presents with a mild oropharyngeal dysphagia characterized by right sided facial weakness (unclear cause), loss of bolus over the base of tongue prior to swallow initiation which is initated at the level of the pyriform sinuses, decreased anterior hyoid movement and decreased UES relaxation with resultant  vallecular and pyriform sinus residue post swallow. Full airway protection observed during today's study but risk of airway invasion is present given degree of residue. Patient does sense residue and responds with spontaenous dry swallows and request for liquid wash with solids which does aid in clearance. In addition, use of screen for biofeedback used to reinforce use of precautions. Recommend continuation fo current diet, OP SLP services to f/u for strengthening exercise program. Overall, cognitive-linguistic status appears to be at baseline wtihout need for formal evaluation in the acute care setting. Factors that may increase risk of adverse event in presence of aspiration Noe & Lianne 2021):    Swallow Evaluation Recommendations Recommendations: PO diet PO Diet Recommendation: Regular;Thin liquids (Level 0) Liquid Administration via: Cup;Straw Medication Administration: Whole meds with liquid Supervision: Patient able to self-feed Swallowing strategies  : Slow rate;Small bites/sips;Multiple dry swallows after each bite/sip;Follow solids with liquids Postural changes: Position pt fully upright for  meals Oral care recommendations: Oral care BID (2x/day)    Yahir Tavano MA, CCC-SLP   Renley Gutman Meryl 11/15/2023,3:51 PM

## 2023-11-15 NOTE — Progress Notes (Signed)
 Echocardiogram 2D Echocardiogram has been performed.  Tinnie FORBES Gosling RDCS 11/15/2023, 10:13 AM

## 2023-11-16 DIAGNOSIS — Z9861 Coronary angioplasty status: Secondary | ICD-10-CM | POA: Diagnosis not present

## 2023-11-16 DIAGNOSIS — I6302 Cerebral infarction due to thrombosis of basilar artery: Secondary | ICD-10-CM | POA: Diagnosis not present

## 2023-11-16 DIAGNOSIS — I2510 Atherosclerotic heart disease of native coronary artery without angina pectoris: Secondary | ICD-10-CM | POA: Diagnosis not present

## 2023-11-16 DIAGNOSIS — I6329 Cerebral infarction due to unspecified occlusion or stenosis of other precerebral arteries: Secondary | ICD-10-CM | POA: Diagnosis not present

## 2023-11-16 DIAGNOSIS — E1151 Type 2 diabetes mellitus with diabetic peripheral angiopathy without gangrene: Secondary | ICD-10-CM | POA: Diagnosis not present

## 2023-11-16 DIAGNOSIS — E1165 Type 2 diabetes mellitus with hyperglycemia: Secondary | ICD-10-CM | POA: Diagnosis not present

## 2023-11-16 DIAGNOSIS — I10 Essential (primary) hypertension: Secondary | ICD-10-CM | POA: Diagnosis not present

## 2023-11-16 DIAGNOSIS — M48061 Spinal stenosis, lumbar region without neurogenic claudication: Secondary | ICD-10-CM | POA: Diagnosis not present

## 2023-11-16 LAB — CBC WITH DIFFERENTIAL/PLATELET
Abs Immature Granulocytes: 0.03 K/uL (ref 0.00–0.07)
Basophils Absolute: 0.1 K/uL (ref 0.0–0.1)
Basophils Relative: 1 %
Eosinophils Absolute: 0.2 K/uL (ref 0.0–0.5)
Eosinophils Relative: 2 %
HCT: 39.3 % (ref 39.0–52.0)
Hemoglobin: 13 g/dL (ref 13.0–17.0)
Immature Granulocytes: 0 %
Lymphocytes Relative: 24 %
Lymphs Abs: 2.7 K/uL (ref 0.7–4.0)
MCH: 30.7 pg (ref 26.0–34.0)
MCHC: 33.1 g/dL (ref 30.0–36.0)
MCV: 92.7 fL (ref 80.0–100.0)
Monocytes Absolute: 1 K/uL (ref 0.1–1.0)
Monocytes Relative: 9 %
Neutro Abs: 7.3 K/uL (ref 1.7–7.7)
Neutrophils Relative %: 64 %
Platelets: 307 K/uL (ref 150–400)
RBC: 4.24 MIL/uL (ref 4.22–5.81)
RDW: 14.2 % (ref 11.5–15.5)
WBC: 11.3 K/uL — ABNORMAL HIGH (ref 4.0–10.5)
nRBC: 0 % (ref 0.0–0.2)

## 2023-11-16 LAB — BASIC METABOLIC PANEL WITH GFR
Anion gap: 10 (ref 5–15)
BUN: 12 mg/dL (ref 6–20)
CO2: 24 mmol/L (ref 22–32)
Calcium: 8.6 mg/dL — ABNORMAL LOW (ref 8.9–10.3)
Chloride: 104 mmol/L (ref 98–111)
Creatinine, Ser: 0.93 mg/dL (ref 0.61–1.24)
GFR, Estimated: >60 mL/min (ref >60)
Glucose, Bld: 130 mg/dL — ABNORMAL HIGH (ref 70–99)
Potassium: 4.1 mmol/L (ref 3.5–5.1)
Sodium: 138 mmol/L (ref 135–145)

## 2023-11-16 LAB — GLUCOSE, CAPILLARY
Glucose-Capillary: 137 mg/dL — ABNORMAL HIGH (ref 70–99)
Glucose-Capillary: 144 mg/dL — ABNORMAL HIGH (ref 70–99)

## 2023-11-16 LAB — PLATELET INHIBITION P2Y12: Platelet Function  P2Y12: 121 [PRU] — ABNORMAL LOW (ref 182–335)

## 2023-11-16 NOTE — TOC Transition Note (Signed)
 Transition of Care [facility]) - Discharge Note   Patient Details  Name: Peter Bell MRN: [id number] Date of Birth: 1970/10/16  Transition of Care [facility]) CM/SW Contact:  Andrez JULIANNA George, RN Phone Number: 11/16/2023, 8:45 AM   Clinical Narrative:     Pt is discharging home with outpatient therapy through [facility] neurorehab. Referral sent. Pt will call to schedule the first appointment. Information on the AVS.  Knee scooter ordered through [employer].  Pt has transportation home.    Final next level of care: OP Rehab Barriers to Discharge: No Barriers Identified   Patient Goals and CMS Choice     Choice offered to / list presented to : Patient      Discharge Placement                       Discharge Plan and Services Additional resources added to the After Visit Summary for                  DME Arranged:  (knee scooter) DME Agency: [employer] Date DME Agency Contacted: 11/16/23   Representative spoke with at DME Agency: Darlyn            Social Drivers of Health (SDOH) Interventions SDOH Screenings   Food Insecurity: Food Insecurity Present (10/24/2023)  Housing: High Risk (10/24/2023)  Transportation Needs: No Transportation Needs (10/24/2023)  Utilities: Not At Risk (10/21/2023)  Alcohol Screen: Low Risk  (10/24/2023)  Depression (PHQ2-9): Low Risk  (10/26/2023)  Financial Resource Strain: Medium Risk (10/24/2023)  Physical Activity: Inactive (10/24/2023)  Social Connections: Unknown (10/24/2023)  Stress: No Stress Concern Present (10/24/2023)  Tobacco Use: Low Risk  (11/14/2023)  Health Literacy: Adequate Health Literacy (10/21/2023)     Readmission Risk Interventions     No data to display

## 2023-11-16 NOTE — Progress Notes (Addendum)
 Speech Language Pathology Treatment: Dysphagia  Patient Details Name: Peter Bell MRN: [id number] DOB: 02-Feb-1971 Today's Date: 11/16/2023 Time: 0825-0905 SLP Time Calculation (min) (ACUTE ONLY): 40 min  Assessment / Plan / Recommendation Clinical Impression  SLP follow up to address dysphagia goals including reviewing MBS findings, assuring po tolerance and initiating Shaker Exercise to faciliate UES relaxation/opening. Pt admits to dysphagia being one of the stroke symptoms starting a few days PTA- states it is getting better. He was able to verbalize findings of MBS and helpful compensation strategies for airway protection. Fortunately he is sensate to his oral secretion and pharyngeal food retention - thus decreasing aspiration risk as he conducts self-intiated dry swallows. Pt consumed water with sequential swallows and no clinical indication of airway compromise. He denies pill dysphagia - advised he start intake with liquids however due to motor deficits - to decrease food adhering to oropharyngeal tissues thus losing propulsion ability. Using teach back, pt able to verbalize said precautions. Shaker Exercise introduced with pt demonstrating excellent performance - Able to conduct istonic hold for 60 seconds x3 - with 60 second rests in between. Isometric lift and lowering head 30 times was achieved x24 prior to pt requiring rest break. Min cues for implementation of exercise assuring adequate length of time for isotonic exercise. Instructed pt to conduct said exercise 3 times a day - (NOT immediately post-meals given his GERD) - until he sees the OP SLP. Pt inquired if oral secretion retention is normal - and with his pons CVA this is normal. Instructed him to make an effort to recall to swallow his saliva and conduct excellent oral care AM and PM due to incr asp risk of secretions. No acute SLP follow up needed. Thanks for this consult.   Pt awaiting breakfast - SLP follow up at OP can  conduct Speech/cog evaluation. Thanks.      HPI HPI: Patient is a 53 year old male with diplopia, dysphagia, slurred speech. 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 PMH: DM2, CAD, spinal stenosis.  Swallow evaluation ordered and conducted with BSE and MBS. Pt diagnosed with mild dysphagia with oropharyngeal deficits - decreased contraction  - with resultant retention and no aspiration.  Follow up indicated.      SLP Plan  Other (Comment) (follow up with OP)          Recommendations  Diet recommendations: Regular;Thin liquid Liquids provided via: Cup;Straw Medication Administration: Whole meds with liquid Supervision: Patient able to self feed Compensations: Slow rate;Small sips/bites;Follow solids with liquid;Multiple dry swallows after each bite/sip (start all intake with liquids) Postural Changes and/or Swallow Maneuvers: Seated upright 90 degrees;Upright 30-60 min after meal                  Oral care BID     Dysphagia, oropharyngeal phase (R13.12)     Other (Comment) (follow up with OP)   Madelin POUR, MS [facility] SLP Acute Rehab Services Office [phone]   Nicolas Emmie Caldron  11/16/2023, 9:15 AM

## 2023-11-16 NOTE — Progress Notes (Signed)
 Discharged to home after IV access removed and discharge instructions given by [facility] nurse Omega.  Stroke book and stroke education provided.

## 2023-11-16 NOTE — Discharge Summary (Signed)
 Physician Discharge Summary   Patient: Peter Bell MRN: [id number] DOB: 05-04-1970  Admit date:     11/14/2023  Discharge date: 11/16/23  Discharge Physician: Lonni SHAUNNA Dalton   PCP: Knute Thersia Bitters, FNP     Recommendations at discharge:  Follow up with PCP [name] for new stroke, diabetes management [name]: See A1c, please titrate diabetes regimen to achieve A1c <7% Please review and titrate BP meds to achieve goal <130 systolic  Follow up with [facility] for stroke follow up, referral sent      Discharge Diagnoses: Principal Problem:   Stroke [facility]) Active Problems:   Essential hypertension, benign   Spinal stenosis of lumbar region   Type 2 diabetes mellitus with hyperglycemia, without long-term current use of insulin  (HCC)   CAD S/P percutaneous coronary angioplasty      Hospital Course: 53 y.o. M with DM, HTN, CAD on DAPT who presented with few days of diplopia, trouble swallowing, slurred speech.  In the ER, MRI brain showed small pontine infarct.     Acute ischemic infarct, right paramedian pons, likely due to small vessel disease due to uncontrolled DM MRI brain showed right paramedian pontine infarct - Non-invasive angiography showed left P1 occlusion with distal reconstitution, proximal basilar artery stenosis, mild, moderate right MCA A2 stenosis - Echocardiogram showed no cardiogenic source of embolism - Carotid imaging unremarkable   - LDL 60 on home atorvastatin  - Aspirin  and Plavix  continued, P2y12 level appeared suppressed on Plavix  - Evaluation for arrhythmia/atrial fibrillation: none on monitoring - tPA not given because outside window - Dysphagia screen ordered in ER, evaluated by SLP, with MBS, cleared for diet - PT eval ordered: Recommended outpatient PT/OT - Nonsmoker     Hypertension Blood pressure on high side, target less than 130 - Follow-up with PCP for antihypertensive  titration  Diabetes A1c 8.8%, goal less than 7%. - Follow-up with PCP for additional diabetes medication          The [place]  Controlled Substances Registry was reviewed for this patient prior to discharge.   Consultants: Neurology   Disposition: Home Diet recommendation:  Discharge Diet Orders (From admission, onward)     Start     Ordered   11/16/23 0000  Diet - low sodium heart healthy        11/16/23 0839             DISCHARGE MEDICATION: Allergies as of 11/16/2023   No Known Allergies      Medication List     TAKE these medications    amLODipine  10 MG tablet Commonly known as: NORVASC  Take 1 tablet (10 mg total) by mouth daily.   aspirin  EC 81 MG tablet Take 1 tablet (81 mg total) by mouth daily. Swallow whole.   atorvastatin  80 MG tablet Commonly known as: LIPITOR Take 1 tablet (80 mg total) by mouth daily.   clopidogrel  75 MG tablet Commonly known as: PLAVIX  Take 1 tablet (75 mg total) by mouth daily.   empagliflozin  10 MG Tabs tablet Commonly known as: JARDIANCE  Take 1 tablet (10 mg total) by mouth daily.   losartan  50 MG tablet Commonly known as: COZAAR  Take 1 tablet (50 mg total) by mouth daily.   metFORMIN  500 MG tablet Commonly known as: GLUCOPHAGE  Take 2 tablets (1,000 mg total) by mouth daily. Take with meal.   metoprolol  succinate 25 MG 24 hr tablet Commonly known as: Toprol  XL Take 1 tablet (25 mg total) by mouth daily.  nitroGLYCERIN  0.4 MG SL tablet Commonly known as: NITROSTAT  Place 1 tablet (0.4 mg total) under the tongue every 5 (five) minutes x 3 doses as needed for chest pain.   pantoprazole  20 MG tablet Commonly known as: PROTONIX  Take 1 tablet (20 mg total) by mouth daily.        Follow-up Information     Caudle, Thersia Bitters, FNP. Schedule an appointment as soon as possible for a visit.   Specialty: Family Medicine Contact information: [address] [place]  [address] [phone]         [facility]. Go to.   Why: 6 weeks Contact information: [address]     [address] [place]  [address] [phone]                Discharge Instructions     Ambulatory referral to Neurology   Complete by: As directed    An appointment is requested in approximately: 8 weeks Stroke hospital follow up   Diet - low sodium heart healthy   Complete by: As directed    Discharge instructions   Complete by: As directed    **IMPORTANT DISCHARGE INSTRUCTIONS**   From Dr. Jonel: You were admitted for a stroke This was a small stroke in the area controlling certain eye movements and certain swallowing muscles  Thankfully, it was a small stroke  It was from damage to a small blood vessel from diabetes, high blood pressure  Go see your PCP in the next week, Marolyn Stark and have her adjust your blood sugar medicines.  Your A1c indicates your sugars are too high  CONTINUE aspirin , Plavix , and atorvastatin /Lipitor  CONTINUE your blood pressure meidicnes, unless Marolyn adjusts them  Go see the Neurologists at [facility] in 6-8 weeks, we have sent a referral, they will contact you  Go see the physical and occupational therapists at the outpatient Neuro-rehab center.  See below in the To Do section for details  Follow all instructions from the speech therapist about swallowing techniques   Increase activity slowly   Complete by: As directed        Discharge Exam: Filed Weights   11/14/23 1155  Weight: 59.4 kg    General: Pt is alert, awake, not in acute distress Cardiovascular: RRR, nl S1-S2, no murmurs appreciated.   No LE edema.   Respiratory: Normal respiratory rate and rhythm.  CTAB without rales or wheezes. Abdominal: Abdomen soft and non-tender.  No distension or HSM.   Neuro/Psych: Strength symmetric in upper and lower extremities.  Judgment and insight appear  normal.   Condition at discharge: good  The results of significant diagnostics from this hospitalization (including imaging, microbiology, ancillary and laboratory) are listed below for reference.   Imaging Studies: DG Swallowing Func-Speech Pathology Result Date: 11/15/2023 Table formatting from the original result was not included. Modified Barium Swallow Study Patient Details Name: KABLE HAYWOOD MRN: [id number] Date of Birth: [birth year] Today's Date: 11/15/2023 HPI/PMH: HPI: Patient is a 53 year old male with diplopia, dysphagia, slurred speech. 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 PMH: DM2, CAD, spinal stenosis Clinical Impression: Clinical Impression: Patient presents with a mild oropharyngeal dysphagia characterized by right sided facial weakness (unclear cause), loss of bolus over the base of tongue prior to swallow initiation which is initated at the level of the pyriform sinuses, decreased anterior hyoid movement and decreased UES relaxation with resultant  vallecular and pyriform  sinus residue post swallow. Full airway protection observed during today's study but risk of airway invasion is present given degree of residue. Patient does sense residue and responds with spontaenous dry swallows and request for liquid wash with solids which does aid in clearance. In addition, use of screen for biofeedback used to reinforce use of precautions. Recommend continuation fo current diet, OP SLP services to f/u for strengthening exercise program. Overall, cognitive-linguistic status appears to be at baseline wtihout need for formal evaluation in the acute care setting. Factors that may increase risk of adverse event in presence of aspiration Noe & Lianne 2021): No data recorded Recommendations/Plan: Swallowing Evaluation Recommendations Swallowing Evaluation Recommendations Recommendations: PO diet PO Diet Recommendation: Regular; Thin liquids (Level 0)  Liquid Administration via: Cup; Straw Medication Administration: Whole meds with liquid Supervision: Patient able to self-feed Swallowing strategies  : Slow rate; Small bites/sips; Multiple dry swallows after each bite/sip; Follow solids with liquids Postural changes: Position pt fully upright for meals Oral care recommendations: Oral care BID (2x/day) Treatment Plan Treatment Plan Treatment recommendations: Therapy as outlined in treatment plan below Follow-up recommendations: Outpatient SLP Functional status assessment: Patient has had a recent decline in their functional status and demonstrates the ability to make significant improvements in function in a reasonable and predictable amount of time. Treatment frequency: Min 1x/week Treatment duration: 1 week Interventions: Aspiration precaution training; Compensatory techniques; Patient/family education; Diet toleration management by SLP Recommendations Recommendations for follow up therapy are one component of a multi-disciplinary discharge planning process, led by the attending physician.  Recommendations may be updated based on patient status, additional functional criteria and insurance authorization. Assessment: Orofacial Exam: Orofacial Exam Oral Cavity: Oral Hygiene: WFL Oral Cavity - Dentition: Adequate natural dentition Orofacial Anatomy: WFL Oral Motor/Sensory Function: -- (right sided facial assymetry, difficult to tell from history if baseline or new) Anatomy: Anatomy: -- (posterior curvature of C-spine) Boluses Administered: Boluses Administered Boluses Administered: Thin liquids (Level 0); Mildly thick liquids (Level 2, nectar thick); Puree; Solid  Oral Impairment Domain: Oral Impairment Domain Lip Closure: Interlabial escape, no progression to anterior lip Tongue control during bolus hold: Cohesive bolus between tongue to palatal seal Bolus preparation/mastication: Timely and efficient chewing and mashing Bolus transport/lingual motion: Brisk tongue  motion Oral residue: Trace residue lining oral structures Location of oral residue : Tongue Initiation of pharyngeal swallow : Pyriform sinuses  Pharyngeal Impairment Domain: Pharyngeal Impairment Domain Soft palate elevation: No bolus between soft palate (SP)/pharyngeal wall (PW) Laryngeal elevation: Complete superior movement of thyroid cartilage with complete approximation of arytenoids to epiglottic petiole Anterior hyoid excursion: Partial anterior movement Epiglottic movement: Complete inversion Laryngeal vestibule closure: Complete, no air/contrast in laryngeal vestibule Pharyngeal stripping wave : Present - complete Pharyngeal contraction (A/P view only): N/A Pharyngoesophageal segment opening: Partial distention/partial duration, partial obstruction of flow Tongue base retraction: Trace column of contrast or air between tongue base and PPW Pharyngeal residue: Collection of residue within or on pharyngeal structures Location of pharyngeal residue: Valleculae; Pyriform sinuses  Esophageal Impairment Domain: Esophageal Impairment Domain Esophageal clearance upright position: Complete clearance, esophageal coating Pill: Pill Consistency administered: Thin liquids (Level 0) Thin liquids (Level 0): [facility] Penetration/Aspiration Scale Score: Penetration/Aspiration Scale Score 1.  Material does not enter airway: Thin liquids (Level 0); Mildly thick liquids (Level 2, nectar thick); Puree; Solid; Pill Compensatory Strategies: Compensatory Strategies Compensatory strategies: Yes Multiple swallows: Effective Effective Multiple Swallows: Thin liquid (Level 0); Mildly thick liquid (Level 2, nectar thick); Puree; Solid Chin tuck: Ineffective (to decrease residue)  Liquid wash: Effective   General Information: Caregiver present: Yes  Diet Prior to this Study: Regular; Thin liquids (Level 0)   Temperature : Normal   Respiratory Status: WFL   Supplemental O2: None (Room air)   History of Recent Intubation: No   Behavior/Cognition: Alert; Cooperative; Pleasant mood Self-Feeding Abilities: Able to self-feed Baseline vocal quality/speech: Normal Volitional Cough: Able to elicit Volitional Swallow: Able to elicit Exam Limitations: No limitations Goal Planning: Prognosis for improved oropharyngeal function: Good No data recorded No data recorded No data recorded Consulted and agree with results and recommendations: Patient Pain: Pain Assessment Pain Assessment: No/denies pain End of Session: Start Time:SLP Start Time (ACUTE ONLY): 1510 Stop Time: SLP Stop Time (ACUTE ONLY): 1521 Time Calculation:SLP Time Calculation (min) (ACUTE ONLY): 11 min Charges: SLP Evaluations $ SLP Speech Visit: 1 Visit SLP Evaluations $BSS Swallow: 1 Procedure $MBS Swallow: 1 Procedure SLP visit diagnosis: SLP Visit Diagnosis: Dysphagia, oropharyngeal phase (R13.12) Past Medical History: Past Medical History: Diagnosis Date  Asthma   Diabetes mellitus without complication (HCC)   Eczema   GERD (gastroesophageal reflux disease)   Hypertension   Myocardial infarction [facility])   Sciatica  Past Surgical History: Past Surgical History: Procedure Laterality Date  CORONARY STENT INTERVENTION N/A 09/26/2023  Procedure: CORONARY STENT INTERVENTION;  Surgeon: Swaziland, [name], MD;  Location: [facility] INVASIVE CV LAB;  Service: Cardiovascular;  Laterality: N/A;  HERNIA REPAIR    LEFT HEART CATH AND CORONARY ANGIOGRAPHY N/A 09/26/2023  Procedure: LEFT HEART CATH AND CORONARY ANGIOGRAPHY;  Surgeon: Swaziland, [name], MD;  Location: [facility] INVASIVE CV LAB;  Service: Cardiovascular;  Laterality: N/A; Rea Pass MA, CCC-SLP McCoy Leah Meryl 11/15/2023, 3:51 PM  ECHOCARDIOGRAM COMPLETE Result Date: 11/15/2023    ECHOCARDIOGRAM REPORT   Patient Name:   NAQUAN GARMAN Busick Date of Exam: 11/15/2023 Medical Rec #:  [id number]         Height:       65.0 in Accession #:    [id number]        Weight:       131.0 lb Date of Birth:  [birth year]         BSA:          1.653 m Patient Age:    52 years           BP:           160/94 mmHg Patient Gender: M                 HR:           53 bpm. Exam Location:  Inpatient Procedure: 2D Echo, Color Doppler and Cardiac Doppler (Both Spectral and Color            Flow Doppler were utilized during procedure). Indications:    Stroke I63.9  History:        Patient has prior history of Echocardiogram examinations, most                 recent 09/27/2023. Previous Myocardial Infarction.  Sonographer:    Tinnie Gosling RDCS Referring Phys: [id number] ANASTASSIA DOUTOVA IMPRESSIONS  1. Left ventricular ejection fraction, by estimation, is 55 to 60%. The left ventricle has normal function. The left ventricle has no regional wall motion abnormalities. There is mild concentric left ventricular hypertrophy. Left ventricular diastolic parameters are consistent with Grade I diastolic dysfunction (impaired relaxation).  2. Right ventricular systolic function is normal. The right ventricular size is normal.  3. The mitral  valve is normal in structure. No evidence of mitral valve regurgitation. No evidence of mitral stenosis.  4. The aortic valve is tricuspid. Aortic valve regurgitation is not visualized. No aortic stenosis is present. Comparison(s): Prior images reviewed side by side. There was a very subtle basal anterolateral wall motion abnormality; normal function on this study. FINDINGS  Left Ventricle: Left ventricular ejection fraction, by estimation, is 55 to 60%. The left ventricle has normal function. The left ventricle has no regional wall motion abnormalities. The left ventricular internal cavity size was normal in size. There is  mild concentric left ventricular hypertrophy. Left ventricular diastolic parameters are consistent with Grade I diastolic dysfunction (impaired relaxation). Right Ventricle: The right ventricular size is normal. No increase in right ventricular wall thickness. Right ventricular systolic function is normal. Left Atrium: Left atrial size was normal in size. Right  Atrium: Right atrial size was normal in size. Pericardium: There is no evidence of pericardial effusion. Mitral Valve: The mitral valve is normal in structure. No evidence of mitral valve regurgitation. No evidence of mitral valve stenosis. Tricuspid Valve: The tricuspid valve is normal in structure. Tricuspid valve regurgitation is not demonstrated. No evidence of tricuspid stenosis. Aortic Valve: The aortic valve is tricuspid. Aortic valve regurgitation is not visualized. No aortic stenosis is present. Pulmonic Valve: The pulmonic valve was normal in structure. Pulmonic valve regurgitation is mild. No evidence of pulmonic stenosis. Aorta: The aortic root and ascending aorta are structurally normal, with no evidence of dilitation. IAS/Shunts: The atrial septum is grossly normal.  LEFT VENTRICLE PLAX 2D LVIDd:         4.50 cm   Diastology LVIDs:         3.40 cm   LV e' medial:    5.33 cm/s LV PW:         1.20 cm   LV E/e' medial:  9.4 LV IVS:        1.10 cm   LV e' lateral:   6.31 cm/s LVOT diam:     2.00 cm   LV E/e' lateral: 7.9 LV SV:         61 LV SV Index:   37 LVOT Area:     3.14 cm  RIGHT VENTRICLE             IVC RV S prime:     13.10 cm/s  IVC diam: 1.60 cm TAPSE (M-mode): 2.4 cm LEFT ATRIUM             Index        RIGHT ATRIUM           Index LA diam:        3.60 cm 2.18 cm/m   RA Area:     14.50 cm LA Vol (A2C):   36.0 ml 21.78 ml/m  RA Volume:   36.60 ml  22.15 ml/m LA Vol (A4C):   39.7 ml 24.02 ml/m LA Biplane Vol: 41.4 ml 25.05 ml/m  AORTIC VALVE LVOT Vmax:   94.00 cm/s LVOT Vmean:  64.000 cm/s LVOT VTI:    0.193 m  AORTA Ao Root diam: 3.00 cm Ao Asc diam:  2.90 cm MITRAL VALVE MV Area (PHT): 1.93 cm    SHUNTS MV Decel Time: 393 msec    Systemic VTI:  0.19 m MV E velocity: 50.10 cm/s  Systemic Diam: 2.00 cm MV A velocity: 74.10 cm/s MV E/A ratio:  0.68 Stanly Leavens MD Electronically signed by Stanly Leavens MD Signature Date/Time: 11/15/2023/12:32:42 PM  Final    CT ANGIO HEAD  NECK W WO CM Result Date: 11/15/2023 EXAM: CTA HEAD AND NECK WITH AND WITHOUT 11/15/2023 12:32:50 AM TECHNIQUE: CTA of the head and neck was performed with and without the administration of intravenous contrast. Multiplanar 2D and/or 3D reformatted images are provided for review. Automated exposure control, iterative reconstruction, and/or weight based adjustment of the mA/kV was utilized to reduce the radiation dose to as low as reasonably achievable. Stenosis of the internal carotid arteries measured using NASCET criteria. COMPARISON: Brain MRI 11/14/2023 CLINICAL HISTORY: Neuro deficit, acute, stroke suspected. Numbness; Diplopia. FINDINGS: CTA NECK: AORTIC ARCH AND ARCH VESSELS: No dissection or arterial injury. No significant stenosis of the brachiocephalic or subclavian arteries. CERVICAL CAROTID ARTERIES: No dissection, arterial injury, or hemodynamically significant stenosis by NASCET criteria. CERVICAL VERTEBRAL ARTERIES: Mild narrowing of the right vertebral artery origin. The left vertebral artery is diminutive along its entire course and terminates in PICA. LUNGS AND MEDIASTINUM: Unremarkable. SOFT TISSUES: No acute abnormality. BONES: No acute abnormality. CTA HEAD: ANTERIOR CIRCULATION: Moderate stenosis of the right anterior cerebral artery A2 segment. No significant stenosis of the internal carotid arteries. No significant stenosis of the middle cerebral arteries. No aneurysm. POSTERIOR CIRCULATION: Mild stenosis of the proximal basilar artery. The left PCA P1 segment is occluded. There is distal reconstitution. No significant stenosis of the vertebral arteries. No aneurysm. OTHER: No dural venous sinus thrombosis on this non-dedicated study. IMPRESSION: 1. 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 2. Mild stenosis of the proximal basilar artery. 3. Moderate stenosis of the right anterior cerebral artery A2 segment. Electronically signed by: Franky Stanford MD 11/15/2023 12:48 AM EDT RP  Workstation: [id number]   DG CHEST PORT 1 VIEW Result Date: 11/14/2023 CLINICAL DATA:  Stroke EXAM: PORTABLE CHEST 1 VIEW COMPARISON:  09/25/2023 FINDINGS: The heart size and mediastinal contours are within normal limits. Both lungs are clear. The visualized skeletal structures are unremarkable. IMPRESSION: No active disease. Electronically Signed   By: Luke Bun M.D.   On: 11/14/2023 23:55   MR Brain W and Wo Contrast Result Date: 11/14/2023 EXAM: MRI BRAIN AND ORBITS WITH AND WITHOUT CONTRAST 11/14/2023 09:00:50 PM TECHNIQUE: Multiplanar multisequence MRI of the brain and orbits was performed with and without the administration of intravenous contrast. COMPARISON: None available. CLINICAL HISTORY: Neuro deficit, acute, stroke suspected. FINDINGS: BRAIN AND VENTRICLES: Small acute infarct within the right pons in the region of the paramedian pontine reticular formation. This is a likely cause of diplopia. 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 Multifocal hyperintense T2-weighted signal within the cerebral white matter, most commonly due to chronic small vessel disease. No acute intracranial hemorrhage. No mass or abnormal enhancement. No midline shift. No hydrocephalus. The sella is unremarkable. Normal flow voids. ORBITS: Normal globes. Lenses are normally located. Symmetric caliber and signal of the optic nerves. Symmetric extraocular muscles. No orbital mass. No abnormal enhancement. SINUSES AND MASTOIDS: Clear. BONES AND SOFT TISSUES: Normal bone marrow signal. No acute soft tissue abnormality. IMPRESSION: 1. Small acute infarct within the right pons in the region of the paramedian pontine reticular formation, likely cause of diplopia. 2. 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 3. Multifocal hyperintense T2-weighted signal within the cerebral white matter, most commonly due to chronic small vessel disease. 4. Normal MRI of the orbits Electronically signed by: Franky Stanford MD  11/14/2023 10:03 PM EDT RP Workstation: [id number]   MR ORBITS W WO CONTRAST Result Date: 11/14/2023 EXAM: MRI BRAIN AND ORBITS  WITH AND WITHOUT CONTRAST 11/14/2023 09:00:50 PM TECHNIQUE: Multiplanar multisequence MRI of the brain and orbits was performed with and without the administration of intravenous contrast. COMPARISON: None available. CLINICAL HISTORY: Neuro deficit, acute, stroke suspected. FINDINGS: BRAIN AND VENTRICLES: Small acute infarct within the right pons in the region of the paramedian pontine reticular formation. This is a likely cause of diplopia. 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 Multifocal hyperintense T2-weighted signal within the cerebral white matter, most commonly due to chronic small vessel disease. No acute intracranial hemorrhage. No mass or abnormal enhancement. No midline shift. No hydrocephalus. The sella is unremarkable. Normal flow voids. ORBITS: Normal globes. Lenses are normally located. Symmetric caliber and signal of the optic nerves. Symmetric extraocular muscles. No orbital mass. No abnormal enhancement. SINUSES AND MASTOIDS: Clear. BONES AND SOFT TISSUES: Normal bone marrow signal. No acute soft tissue abnormality. IMPRESSION: 1. Small acute infarct within the right pons in the region of the paramedian pontine reticular formation, likely cause of diplopia. 2. 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 3. Multifocal hyperintense T2-weighted signal within the cerebral white matter, most commonly due to chronic small vessel disease. 4. Normal MRI of the orbits Electronically signed by: Franky Stanford MD 11/14/2023 10:03 PM EDT RP Workstation: [id number]   CT Head Wo Contrast Result Date: 11/14/2023 CLINICAL DATA:  Acute neurologic deficit.  Suspected stroke. EXAM: CT HEAD WITHOUT CONTRAST TECHNIQUE: Contiguous axial images were obtained from the base of the skull through the vertex without intravenous contrast. RADIATION DOSE REDUCTION: This exam was  performed according to the departmental dose-optimization program which includes automated exposure control, adjustment of the mA and/or kV according to patient size and/or use of iterative reconstruction technique. COMPARISON:  03/15/2019 FINDINGS: Brain: No evidence of intracranial hemorrhage, acute infarction, hydrocephalus, extra-axial collection, or mass lesion/mass effect. Mild chronic small vessel disease is again noted. Old lacunar infarct noted in the left pons. Vascular:  No hyperdense vessel or other acute findings. Skull: No evidence of fracture or other significant bone abnormality. Sinuses/Orbits:  No acute findings. Other: None. IMPRESSION: No acute intracranial abnormality. Mild chronic small vessel disease and old left pontine lacune. Electronically Signed   By: Norleen DELENA Kil M.D.   On: 11/14/2023 17:47    Microbiology: Results for orders placed or performed during the hospital encounter of 03/30/23  Gastrointestinal Panel by PCR , Stool     Status: Abnormal   Collection Time: 03/30/23 10:04 AM   Specimen: Stool  Result Value Ref Range Status   Campylobacter species NOT DETECTED NOT DETECTED Final   Plesimonas shigelloides NOT DETECTED NOT DETECTED Final   Salmonella species NOT DETECTED NOT DETECTED Final   Yersinia enterocolitica NOT DETECTED NOT DETECTED Final   Vibrio species NOT DETECTED NOT DETECTED Final   Vibrio cholerae NOT DETECTED NOT DETECTED Final   Enteroaggregative E coli (EAEC) NOT DETECTED NOT DETECTED Final   Enteropathogenic E coli (EPEC) NOT DETECTED NOT DETECTED Final   Enterotoxigenic E coli (ETEC) NOT DETECTED NOT DETECTED Final   Shiga like toxin producing E coli (STEC) NOT DETECTED NOT DETECTED Final   Shigella/Enteroinvasive E coli (EIEC) NOT DETECTED NOT DETECTED Final   Cryptosporidium NOT DETECTED NOT DETECTED Final   Cyclospora cayetanensis NOT DETECTED NOT DETECTED Final   Entamoeba histolytica NOT DETECTED NOT DETECTED Final   Giardia lamblia NOT  DETECTED NOT DETECTED Final   Adenovirus F40/41 NOT DETECTED NOT DETECTED Final   Astrovirus NOT DETECTED NOT DETECTED Final   Norovirus GI/GII NOT  DETECTED NOT DETECTED Final   Rotavirus A NOT DETECTED NOT DETECTED Final   Sapovirus (I, II, IV, and V) DETECTED (A) NOT DETECTED Final    Comment: Performed at [facility], [address] [place], [place] [address]  C Difficile Quick Screen w PCR reflex     Status: None   Collection Time: 03/30/23 10:05 AM   Specimen: Stool  Result Value Ref Range Status   C Diff antigen NEGATIVE NEGATIVE Final   C Diff toxin NEGATIVE NEGATIVE Final   C Diff interpretation No C. difficile detected.  Final    Comment: Performed at [facility], [address], [place], [place] [address]    Labs: CBC: Recent Labs  Lab 11/14/23 1215 11/16/23 0322  WBC 10.2 11.3*  NEUTROABS  --  7.3  HGB 13.5 13.0  HCT 41.2 39.3  MCV 93.8 92.7  PLT 334 307   Basic Metabolic Panel: Recent Labs  Lab 11/14/23 1215 11/16/23 0322  NA 138 138  K 4.1 4.1  CL 107 104  CO2 20* 24  GLUCOSE 294* 130*  BUN 8 12  CREATININE 1.01 0.93  CALCIUM  8.8* 8.6*   Liver Function Tests: Recent Labs  Lab 11/14/23 1215  AST 21  ALT 14  ALKPHOS 103  BILITOT 0.9  PROT 7.4  ALBUMIN 3.6   CBG: Recent Labs  Lab 11/15/23 1621 11/15/23 1938 11/15/23 2340 11/16/23 0417 11/16/23 0746  GLUCAP 185* 152* 160* 137* 144*    Discharge time spent: approximately 45 minutes spent on discharge counseling, evaluation of patient on day of discharge, and coordination of discharge planning with nursing, social work, pharmacy and case management  Signed: Lonni SHAUNNA Dalton, MD [facility] 11/16/2023

## 2023-11-16 NOTE — Plan of Care (Signed)
 Pt do not want to be disturb most of the time. Re explained to him that he needs to be assessed and blood sugar should be monitor too, though he is complaining, he still allows it.  Problem: Coping: Goal: Ability to adjust to condition or change in health will improve Outcome: Progressing   Problem: Health Behavior/Discharge Planning: Goal: Ability to manage health-related needs will improve Outcome: Progressing   Problem: Nutritional: Goal: Maintenance of adequate nutrition will improve Outcome: Progressing   Problem: Ischemic Stroke/TIA Tissue Perfusion: Goal: Complications of ischemic stroke/TIA will be minimized Outcome: Progressing   Problem: Coping: Goal: Level of anxiety will decrease Outcome: Progressing   Problem: Safety: Goal: Ability to remain free from injury will improve Outcome: Progressing

## 2023-11-16 NOTE — Progress Notes (Signed)
 STROKE TEAM PROGRESS NOTE   SUBJECTIVE (INTERVAL HISTORY) His RN is at the bedside. Neuro stable, passed swallow, still has some diplopia. P2Y12 = 121, OK to continue ASA and plavix . PT and OT recommend outpt.    OBJECTIVE Temp:  [97.4 F (36.3 C)-98.9 F (37.2 C)] 98.7 F (37.1 C) (07/23 0743) Pulse Rate:  [60-84] 80 (07/23 0743) Cardiac Rhythm: Sinus bradycardia (07/23 0659) Resp:  [14-18] 18 (07/23 0743) BP: (135-172)/(69-99) 149/89 (07/23 0743) SpO2:  [98 %-100 %] 100 % (07/23 0743)  Recent Labs  Lab 11/15/23 1621 11/15/23 1938 11/15/23 2340 11/16/23 0417 11/16/23 0746  GLUCAP 185* 152* 160* 137* 144*   Recent Labs  Lab 11/14/23 1215 11/16/23 0322  NA 138 138  K 4.1 4.1  CL 107 104  CO2 20* 24  GLUCOSE 294* 130*  BUN 8 12  CREATININE 1.01 0.93  CALCIUM  8.8* 8.6*   Recent Labs  Lab 11/14/23 1215  AST 21  ALT 14  ALKPHOS 103  BILITOT 0.9  PROT 7.4  ALBUMIN 3.6   Recent Labs  Lab 11/14/23 1215 11/16/23 0322  WBC 10.2 11.3*  NEUTROABS  --  7.3  HGB 13.5 13.0  HCT 41.2 39.3  MCV 93.8 92.7  PLT 334 307   No results for input(s): CKTOTAL, CKMB, CKMBINDEX, TROPONINI in the last 168 hours. No results for input(s): LABPROT, INR in the last 72 hours. Recent Labs    11/14/23 1516  COLORURINE YELLOW  LABSPEC 1.038*  PHURINE 5.0  GLUCOSEU >=500*  HGBUR NEGATIVE  BILIRUBINUR NEGATIVE  KETONESUR NEGATIVE  PROTEINUR NEGATIVE  NITRITE NEGATIVE  LEUKOCYTESUR NEGATIVE       Component Value Date/Time   CHOL 96 11/15/2023 0443   CHOL 108 03/31/2023 1001   TRIG 42 11/15/2023 0443   HDL 28 (L) 11/15/2023 0443   HDL 29 (L) 03/31/2023 1001   CHOLHDL 3.4 11/15/2023 0443   VLDL 8 11/15/2023 0443   LDLCALC 60 11/15/2023 0443   LDLCALC 66 03/31/2023 1001   Lab Results  Component Value Date   HGBA1C 8.8 (H) 09/26/2023      Component Value Date/Time   LABOPIA NONE DETECTED 11/15/2023 1100   COCAINSCRNUR NONE DETECTED 11/15/2023 1100    LABBENZ NONE DETECTED 11/15/2023 1100   AMPHETMU NONE DETECTED 11/15/2023 1100   THCU NONE DETECTED 11/15/2023 1100   LABBARB NONE DETECTED 11/15/2023 1100    No results for input(s): ETH in the last 168 hours.  I have personally reviewed the radiological images below and agree with the radiology interpretations.  DG Swallowing Func-Speech Pathology Result Date: 11/15/2023 Table formatting from the original result was not included. Modified Barium Swallow Study Patient Details Name: HUMZA TALLERICO MRN: [id number] Date of Birth: [birth year] Today's Date: 11/15/2023 HPI/PMH: HPI: Patient is a 53 year old male with diplopia, dysphagia, slurred speech. 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 PMH: DM2, CAD, spinal stenosis Clinical Impression: Clinical Impression: Patient presents with a mild oropharyngeal dysphagia characterized by right sided facial weakness (unclear cause), loss of bolus over the base of tongue prior to swallow initiation which is initated at the level of the pyriform sinuses, decreased anterior hyoid movement and decreased UES relaxation with resultant  vallecular and pyriform sinus residue post swallow. Full airway protection observed during today's study but risk of airway invasion is present given degree of residue. Patient does sense residue and responds with spontaenous dry swallows and request for liquid wash with solids which  does aid in clearance. In addition, use of screen for biofeedback used to reinforce use of precautions. Recommend continuation fo current diet, OP SLP services to f/u for strengthening exercise program. Overall, cognitive-linguistic status appears to be at baseline wtihout need for formal evaluation in the acute care setting. Factors that may increase risk of adverse event in presence of aspiration Noe & Lianne 2021): No data recorded Recommendations/Plan: Swallowing Evaluation Recommendations Swallowing  Evaluation Recommendations Recommendations: PO diet PO Diet Recommendation: Regular; Thin liquids (Level 0) Liquid Administration via: Cup; Straw Medication Administration: Whole meds with liquid Supervision: Patient able to self-feed Swallowing strategies  : Slow rate; Small bites/sips; Multiple dry swallows after each bite/sip; Follow solids with liquids Postural changes: Position pt fully upright for meals Oral care recommendations: Oral care BID (2x/day) Treatment Plan Treatment Plan Treatment recommendations: Therapy as outlined in treatment plan below Follow-up recommendations: Outpatient SLP Functional status assessment: Patient has had a recent decline in their functional status and demonstrates the ability to make significant improvements in function in a reasonable and predictable amount of time. Treatment frequency: Min 1x/week Treatment duration: 1 week Interventions: Aspiration precaution training; Compensatory techniques; Patient/family education; Diet toleration management by SLP Recommendations Recommendations for follow up therapy are one component of a multi-disciplinary discharge planning process, led by the attending physician.  Recommendations may be updated based on patient status, additional functional criteria and insurance authorization. Assessment: Orofacial Exam: Orofacial Exam Oral Cavity: Oral Hygiene: WFL Oral Cavity - Dentition: Adequate natural dentition Orofacial Anatomy: WFL Oral Motor/Sensory Function: -- (right sided facial assymetry, difficult to tell from history if baseline or new) Anatomy: Anatomy: -- (posterior curvature of C-spine) Boluses Administered: Boluses Administered Boluses Administered: Thin liquids (Level 0); Mildly thick liquids (Level 2, nectar thick); Puree; Solid  Oral Impairment Domain: Oral Impairment Domain Lip Closure: Interlabial escape, no progression to anterior lip Tongue control during bolus hold: Cohesive bolus between tongue to palatal seal Bolus  preparation/mastication: Timely and efficient chewing and mashing Bolus transport/lingual motion: Brisk tongue motion Oral residue: Trace residue lining oral structures Location of oral residue : Tongue Initiation of pharyngeal swallow : Pyriform sinuses  Pharyngeal Impairment Domain: Pharyngeal Impairment Domain Soft palate elevation: No bolus between soft palate (SP)/pharyngeal wall (PW) Laryngeal elevation: Complete superior movement of thyroid cartilage with complete approximation of arytenoids to epiglottic petiole Anterior hyoid excursion: Partial anterior movement Epiglottic movement: Complete inversion Laryngeal vestibule closure: Complete, no air/contrast in laryngeal vestibule Pharyngeal stripping wave : Present - complete Pharyngeal contraction (A/P view only): N/A Pharyngoesophageal segment opening: Partial distention/partial duration, partial obstruction of flow Tongue base retraction: Trace column of contrast or air between tongue base and PPW Pharyngeal residue: Collection of residue within or on pharyngeal structures Location of pharyngeal residue: Valleculae; Pyriform sinuses  Esophageal Impairment Domain: Esophageal Impairment Domain Esophageal clearance upright position: Complete clearance, esophageal coating Pill: Pill Consistency administered: Thin liquids (Level 0) Thin liquids (Level 0): [facility] Penetration/Aspiration Scale Score: Penetration/Aspiration Scale Score 1.  Material does not enter airway: Thin liquids (Level 0); Mildly thick liquids (Level 2, nectar thick); Puree; Solid; Pill Compensatory Strategies: Compensatory Strategies Compensatory strategies: Yes Multiple swallows: Effective Effective Multiple Swallows: Thin liquid (Level 0); Mildly thick liquid (Level 2, nectar thick); Puree; Solid Chin tuck: Ineffective (to decrease residue) Liquid wash: Effective   General Information: Caregiver present: Yes  Diet Prior to this Study: Regular; Thin liquids (Level 0)   Temperature : Normal    Respiratory Status: WFL   Supplemental O2: None (Room air)  History of Recent Intubation: No  Behavior/Cognition: Alert; Cooperative; Pleasant mood Self-Feeding Abilities: Able to self-feed Baseline vocal quality/speech: Normal Volitional Cough: Able to elicit Volitional Swallow: Able to elicit Exam Limitations: No limitations Goal Planning: Prognosis for improved oropharyngeal function: Good No data recorded No data recorded No data recorded Consulted and agree with results and recommendations: Patient Pain: Pain Assessment Pain Assessment: No/denies pain End of Session: Start Time:SLP Start Time (ACUTE ONLY): 1510 Stop Time: SLP Stop Time (ACUTE ONLY): 1521 Time Calculation:SLP Time Calculation (min) (ACUTE ONLY): 11 min Charges: SLP Evaluations $ SLP Speech Visit: 1 Visit SLP Evaluations $BSS Swallow: 1 Procedure $MBS Swallow: 1 Procedure SLP visit diagnosis: SLP Visit Diagnosis: Dysphagia, oropharyngeal phase (R13.12) Past Medical History: Past Medical History: Diagnosis Date  Asthma   Diabetes mellitus without complication (HCC)   Eczema   GERD (gastroesophageal reflux disease)   Hypertension   Myocardial infarction [facility])   Sciatica  Past Surgical History: Past Surgical History: Procedure Laterality Date  CORONARY STENT INTERVENTION N/A 09/26/2023  Procedure: CORONARY STENT INTERVENTION;  Surgeon: Swaziland, [name], MD;  Location: [facility] INVASIVE CV LAB;  Service: Cardiovascular;  Laterality: N/A;  HERNIA REPAIR    LEFT HEART CATH AND CORONARY ANGIOGRAPHY N/A 09/26/2023  Procedure: LEFT HEART CATH AND CORONARY ANGIOGRAPHY;  Surgeon: Swaziland, [name], MD;  Location: [facility] INVASIVE CV LAB;  Service: Cardiovascular;  Laterality: N/A; Rea Pass MA, CCC-SLP McCoy Leah Meryl 11/15/2023, 3:51 PM  ECHOCARDIOGRAM COMPLETE Result Date: 11/15/2023    ECHOCARDIOGRAM REPORT   Patient Name:   GARISON GENOVA Stigler Date of Exam: 11/15/2023 Medical Rec #:  [id number]         Height:       65.0 in Accession #:    [id number]        Weight:        131.0 lb Date of Birth:  21-Feb-1971         BSA:          1.653 m Patient Age:    52 years          BP:           160/94 mmHg Patient Gender: M                 HR:           53 bpm. Exam Location:  Inpatient Procedure: 2D Echo, Color Doppler and Cardiac Doppler (Both Spectral and Color            Flow Doppler were utilized during procedure). Indications:    Stroke I63.9  History:        Patient has prior history of Echocardiogram examinations, most                 recent 09/27/2023. Previous Myocardial Infarction.  Sonographer:    Tinnie Gosling RDCS Referring Phys: [id number] ANASTASSIA DOUTOVA IMPRESSIONS  1. Left ventricular ejection fraction, by estimation, is 55 to 60%. The left ventricle has normal function. The left ventricle has no regional wall motion abnormalities. There is mild concentric left ventricular hypertrophy. Left ventricular diastolic parameters are consistent with Grade I diastolic dysfunction (impaired relaxation).  2. Right ventricular systolic function is normal. The right ventricular size is normal.  3. The mitral valve is normal in structure. No evidence of mitral valve regurgitation. No evidence of mitral stenosis.  4. The aortic valve is tricuspid. Aortic valve regurgitation is not visualized. No aortic stenosis is present. Comparison(s): Prior images reviewed side by side.  There was a very subtle basal anterolateral wall motion abnormality; normal function on this study. FINDINGS  Left Ventricle: Left ventricular ejection fraction, by estimation, is 55 to 60%. The left ventricle has normal function. The left ventricle has no regional wall motion abnormalities. The left ventricular internal cavity size was normal in size. There is  mild concentric left ventricular hypertrophy. Left ventricular diastolic parameters are consistent with Grade I diastolic dysfunction (impaired relaxation). Right Ventricle: The right ventricular size is normal. No increase in right ventricular wall thickness. Right  ventricular systolic function is normal. Left Atrium: Left atrial size was normal in size. Right Atrium: Right atrial size was normal in size. Pericardium: There is no evidence of pericardial effusion. Mitral Valve: The mitral valve is normal in structure. No evidence of mitral valve regurgitation. No evidence of mitral valve stenosis. Tricuspid Valve: The tricuspid valve is normal in structure. Tricuspid valve regurgitation is not demonstrated. No evidence of tricuspid stenosis. Aortic Valve: The aortic valve is tricuspid. Aortic valve regurgitation is not visualized. No aortic stenosis is present. Pulmonic Valve: The pulmonic valve was normal in structure. Pulmonic valve regurgitation is mild. No evidence of pulmonic stenosis. Aorta: The aortic root and ascending aorta are structurally normal, with no evidence of dilitation. IAS/Shunts: The atrial septum is grossly normal.  LEFT VENTRICLE PLAX 2D LVIDd:         4.50 cm   Diastology LVIDs:         3.40 cm   LV e' medial:    5.33 cm/s LV PW:         1.20 cm   LV E/e' medial:  9.4 LV IVS:        1.10 cm   LV e' lateral:   6.31 cm/s LVOT diam:     2.00 cm   LV E/e' lateral: 7.9 LV SV:         61 LV SV Index:   37 LVOT Area:     3.14 cm  RIGHT VENTRICLE             IVC RV S prime:     13.10 cm/s  IVC diam: 1.60 cm TAPSE (M-mode): 2.4 cm LEFT ATRIUM             Index        RIGHT ATRIUM           Index LA diam:        3.60 cm 2.18 cm/m   RA Area:     14.50 cm LA Vol (A2C):   36.0 ml 21.78 ml/m  RA Volume:   36.60 ml  22.15 ml/m LA Vol (A4C):   39.7 ml 24.02 ml/m LA Biplane Vol: 41.4 ml 25.05 ml/m  AORTIC VALVE LVOT Vmax:   94.00 cm/s LVOT Vmean:  64.000 cm/s LVOT VTI:    0.193 m  AORTA Ao Root diam: 3.00 cm Ao Asc diam:  2.90 cm MITRAL VALVE MV Area (PHT): 1.93 cm    SHUNTS MV Decel Time: 393 msec    Systemic VTI:  0.19 m MV E velocity: 50.10 cm/s  Systemic Diam: 2.00 cm MV A velocity: 74.10 cm/s MV E/A ratio:  0.68 Stanly Leavens MD Electronically signed  by Stanly Leavens MD Signature Date/Time: 11/15/2023/12:32:42 PM    Final    CT ANGIO HEAD NECK W WO CM Result Date: 11/15/2023 EXAM: CTA HEAD AND NECK WITH AND WITHOUT 11/15/2023 12:32:50 AM TECHNIQUE: CTA of the head and neck was performed with and without  the administration of intravenous contrast. Multiplanar 2D and/or 3D reformatted images are provided for review. Automated exposure control, iterative reconstruction, and/or weight based adjustment of the mA/kV was utilized to reduce the radiation dose to as low as reasonably achievable. Stenosis of the internal carotid arteries measured using NASCET criteria. COMPARISON: Brain MRI 11/14/2023 CLINICAL HISTORY: Neuro deficit, acute, stroke suspected. Numbness; Diplopia. FINDINGS: CTA NECK: AORTIC ARCH AND ARCH VESSELS: No dissection or arterial injury. No significant stenosis of the brachiocephalic or subclavian arteries. CERVICAL CAROTID ARTERIES: No dissection, arterial injury, or hemodynamically significant stenosis by NASCET criteria. CERVICAL VERTEBRAL ARTERIES: Mild narrowing of the right vertebral artery origin. The left vertebral artery is diminutive along its entire course and terminates in PICA. LUNGS AND MEDIASTINUM: Unremarkable. SOFT TISSUES: No acute abnormality. BONES: No acute abnormality. CTA HEAD: ANTERIOR CIRCULATION: Moderate stenosis of the right anterior cerebral artery A2 segment. No significant stenosis of the internal carotid arteries. No significant stenosis of the middle cerebral arteries. No aneurysm. POSTERIOR CIRCULATION: Mild stenosis of the proximal basilar artery. The left PCA P1 segment is occluded. There is distal reconstitution. No significant stenosis of the vertebral arteries. No aneurysm. OTHER: No dural venous sinus thrombosis on this non-dedicated study. IMPRESSION: 1. 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 2. Mild stenosis of the proximal basilar artery. 3. Moderate stenosis of the right  anterior cerebral artery A2 segment. Electronically signed by: Franky Stanford MD 11/15/2023 12:48 AM EDT RP Workstation: [id number]   DG CHEST PORT 1 VIEW Result Date: 11/14/2023 CLINICAL DATA:  Stroke EXAM: PORTABLE CHEST 1 VIEW COMPARISON:  09/25/2023 FINDINGS: The heart size and mediastinal contours are within normal limits. Both lungs are clear. The visualized skeletal structures are unremarkable. IMPRESSION: No active disease. Electronically Signed   By: Luke Bun M.D.   On: 11/14/2023 23:55   MR Brain W and Wo Contrast Result Date: 11/14/2023 EXAM: MRI BRAIN AND ORBITS WITH AND WITHOUT CONTRAST 11/14/2023 09:00:50 PM TECHNIQUE: Multiplanar multisequence MRI of the brain and orbits was performed with and without the administration of intravenous contrast. COMPARISON: None available. CLINICAL HISTORY: Neuro deficit, acute, stroke suspected. FINDINGS: BRAIN AND VENTRICLES: Small acute infarct within the right pons in the region of the paramedian pontine reticular formation. This is a likely cause of diplopia. 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 Multifocal hyperintense T2-weighted signal within the cerebral white matter, most commonly due to chronic small vessel disease. No acute intracranial hemorrhage. No mass or abnormal enhancement. No midline shift. No hydrocephalus. The sella is unremarkable. Normal flow voids. ORBITS: Normal globes. Lenses are normally located. Symmetric caliber and signal of the optic nerves. Symmetric extraocular muscles. No orbital mass. No abnormal enhancement. SINUSES AND MASTOIDS: Clear. BONES AND SOFT TISSUES: Normal bone marrow signal. No acute soft tissue abnormality. IMPRESSION: 1. Small acute infarct within the right pons in the region of the paramedian pontine reticular formation, likely cause of diplopia. 2. 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 3. Multifocal hyperintense T2-weighted signal within the cerebral white matter, most commonly due to  chronic small vessel disease. 4. Normal MRI of the orbits Electronically signed by: Franky Stanford MD 11/14/2023 10:03 PM EDT RP Workstation: [id number]   MR ORBITS W WO CONTRAST Result Date: 11/14/2023 EXAM: MRI BRAIN AND ORBITS WITH AND WITHOUT CONTRAST 11/14/2023 09:00:50 PM TECHNIQUE: Multiplanar multisequence MRI of the brain and orbits was performed with and without the administration of intravenous contrast. COMPARISON: None available. CLINICAL HISTORY: Neuro deficit, acute, stroke suspected. FINDINGS: BRAIN  AND VENTRICLES: Small acute infarct within the right pons in the region of the paramedian pontine reticular formation. This is a likely cause of diplopia. 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 Multifocal hyperintense T2-weighted signal within the cerebral white matter, most commonly due to chronic small vessel disease. No acute intracranial hemorrhage. No mass or abnormal enhancement. No midline shift. No hydrocephalus. The sella is unremarkable. Normal flow voids. ORBITS: Normal globes. Lenses are normally located. Symmetric caliber and signal of the optic nerves. Symmetric extraocular muscles. No orbital mass. No abnormal enhancement. SINUSES AND MASTOIDS: Clear. BONES AND SOFT TISSUES: Normal bone marrow signal. No acute soft tissue abnormality. IMPRESSION: 1. Small acute infarct within the right pons in the region of the paramedian pontine reticular formation, likely cause of diplopia. 2. 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 3. Multifocal hyperintense T2-weighted signal within the cerebral white matter, most commonly due to chronic small vessel disease. 4. Normal MRI of the orbits Electronically signed by: Franky Stanford MD 11/14/2023 10:03 PM EDT RP Workstation: [id number]   CT Head Wo Contrast Result Date: 11/14/2023 CLINICAL DATA:  Acute neurologic deficit.  Suspected stroke. EXAM: CT HEAD WITHOUT CONTRAST TECHNIQUE: Contiguous axial images were obtained from the base of the  skull through the vertex without intravenous contrast. RADIATION DOSE REDUCTION: This exam was performed according to the departmental dose-optimization program which includes automated exposure control, adjustment of the mA and/or kV according to patient size and/or use of iterative reconstruction technique. COMPARISON:  03/15/2019 FINDINGS: Brain: No evidence of intracranial hemorrhage, acute infarction, hydrocephalus, extra-axial collection, or mass lesion/mass effect. Mild chronic small vessel disease is again noted. Old lacunar infarct noted in the left pons. Vascular:  No hyperdense vessel or other acute findings. Skull: No evidence of fracture or other significant bone abnormality. Sinuses/Orbits:  No acute findings. Other: None. IMPRESSION: No acute intracranial abnormality. Mild chronic small vessel disease and old left pontine lacune. Electronically Signed   By: Norleen DELENA Kil M.D.   On: 11/14/2023 17:47     PHYSICAL EXAM  Temp:  [97.4 F (36.3 C)-98.9 F (37.2 C)] 98.7 F (37.1 C) (07/23 0743) Pulse Rate:  [60-84] 80 (07/23 0743) Resp:  [14-18] 18 (07/23 0743) BP: (135-172)/(69-99) 149/89 (07/23 0743) SpO2:  [98 %-100 %] 100 % (07/23 0743)  General - Well nourished, well developed, in no apparent distress.  Ophthalmologic - fundi not visualized due to noncooperation.  Cardiovascular - Regular rhythm and rate.  Mental Status -  Level of arousal and orientation to time, place, and person were intact. Language including expression, naming, repetition, comprehension was assessed and found intact. Fund of Knowledge was assessed and was intact.  Cranial Nerves II - XII - II - Visual field intact OU. III, IV, VI - Extraocular movements intact. No dysconjugate seen but complaining of right gaze horizontal diplopia V - Facial sensation intact bilaterally. VII - mild facial droop on the right, symmetrical eye closure and raising up eyebrows.  VIII - Hearing & vestibular intact  bilaterally. X - Palate elevates symmetrically. XI - Chin turning & shoulder shrug intact bilaterally. XII - Tongue protrusion intact.  Motor Strength - The patient's strength was normal in all extremities and pronator drift was absent.  Bulk was normal and fasciculations were absent.   Motor Tone - Muscle tone was assessed at the neck and appendages and was normal.  Reflexes - The patient's reflexes were symmetrical in all extremities and he had no pathological reflexes.  Sensory - Light  touch, temperature/pinprick were assessed and were symmetrical.    Coordination - The patient had normal movements in the hands and feet with no ataxia or dysmetria.  Tremor was absent.  Gait and Station - deferred.   ASSESSMENT/PLAN Mr. ROURKE MCQUITTY is a 53 y.o. male with history of HTN, DM, recent CAD s/p stenting on DAPT admitted for slurry speech, diplopia and dysphagia for 4 days. No TNK given due to outside window.    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 Continue on discharge.  Patient counseled to be compliant with his antithrombotic medications Ongoing aggressive stroke risk factor management Therapy recommendations:  outpt PT Disposition:  home  Diabetes HgbA1c 8.8 goal < 7.0 Uncontrolled CBG monitoring SSI DM education and close PCP follow up  CAD 09/2023 LHC showed 2 vessel CAD S/p PCI with cardiac stenting Now on DAPT for a year, reported compliance  Hypertension Stable on the high end Outside permissive hypertension  window On norvasc  10 and metorpolol Gradually normalize BP Long term BP goal normotensive  Hyperlipidemia Home meds:  lipitor 80  LDL 113->60, goal < 70 Resumed home lipitor Continue statin at discharge  Other Stroke Risk Factors   Other Active Problems   Hospital day # 0  Neurology will sign off. Please call with questions. Pt will follow up with stroke clinic NP at [facility] in about 4 weeks. Thanks for the consult.   Ary Cummins, MD PhD Stroke Neurology 11/16/2023 10:16 AM    To contact Stroke Continuity provider, please refer to [email or web address]. After hours, contact General Neurology

## 2023-11-17 ENCOUNTER — Telehealth: Payer: Self-pay | Admitting: Licensed Clinical Social Worker

## 2023-11-17 NOTE — Telephone Encounter (Signed)
 H&V Care Navigation CSW Progress Note  Clinical Social Worker contacted patient by phone to f/u on patient referral for Medicaid. Pt approved for Medicaid. Noted on file at this time, pharmacy benefits active. Attempted to reach pt again for a third time to explain now that he has been d/c from ED. Phone voicemail full. Sent myChart also not read at this time. Available as needed moving forward.    Patient is participating in a Managed Medicaid Plan:  Yes- [employer]  SDOH Screenings   Food Insecurity: Food Insecurity Present (11/16/2023)  Housing: High Risk (11/16/2023)  Transportation Needs: No Transportation Needs (10/24/2023)  Utilities: Not At Risk (10/21/2023)  Alcohol Screen: Low Risk  (10/24/2023)  Depression (PHQ2-9): Low Risk  (10/26/2023)  Financial Resource Strain: Medium Risk (10/24/2023)  Physical Activity: Inactive (10/24/2023)  Social Connections: Unknown (10/24/2023)  Stress: No Stress Concern Present (10/24/2023)  Tobacco Use: Low Risk  (11/14/2023)  Health Literacy: Adequate Health Literacy (10/21/2023)    Marit Lark, MSW, LCSW Clinical Social Worker [facility] Heart/Vascular Care Navigation  [phone]- work cell phone (preferred)

## 2023-11-21 ENCOUNTER — Ambulatory Visit (HOSPITAL_BASED_OUTPATIENT_CLINIC_OR_DEPARTMENT_OTHER): Admitting: Family Medicine

## 2023-11-21 ENCOUNTER — Other Ambulatory Visit: Payer: Self-pay

## 2023-11-21 ENCOUNTER — Encounter (HOSPITAL_BASED_OUTPATIENT_CLINIC_OR_DEPARTMENT_OTHER): Payer: Self-pay | Admitting: Family Medicine

## 2023-11-21 ENCOUNTER — Other Ambulatory Visit (HOSPITAL_BASED_OUTPATIENT_CLINIC_OR_DEPARTMENT_OTHER): Payer: Self-pay

## 2023-11-21 VITALS — BP 133/94 | HR 91 | Ht 66.0 in | Wt 129.4 lb

## 2023-11-21 DIAGNOSIS — E1165 Type 2 diabetes mellitus with hyperglycemia: Secondary | ICD-10-CM | POA: Diagnosis not present

## 2023-11-21 DIAGNOSIS — I10 Essential (primary) hypertension: Secondary | ICD-10-CM

## 2023-11-21 DIAGNOSIS — I6302 Cerebral infarction due to thrombosis of basilar artery: Secondary | ICD-10-CM | POA: Diagnosis not present

## 2023-11-21 DIAGNOSIS — Z09 Encounter for follow-up examination after completed treatment for conditions other than malignant neoplasm: Secondary | ICD-10-CM

## 2023-11-21 DIAGNOSIS — Z1211 Encounter for screening for malignant neoplasm of colon: Secondary | ICD-10-CM

## 2023-11-21 MED ORDER — RYBELSUS 3 MG PO TABS
ORAL_TABLET | ORAL | 0 refills | Status: DC
  Filled 2023-11-21 – 2023-12-19 (×5): qty 30, 30d supply, fill #0

## 2023-11-21 MED ORDER — ATORVASTATIN CALCIUM 80 MG PO TABS
80.0000 mg | ORAL_TABLET | Freq: Every day | ORAL | 3 refills | Status: DC
  Filled 2023-11-21 – 2023-12-16 (×2): qty 90, 90d supply, fill #0

## 2023-11-21 MED ORDER — AMLODIPINE BESYLATE 10 MG PO TABS
10.0000 mg | ORAL_TABLET | Freq: Every day | ORAL | 3 refills | Status: AC
  Filled 2023-11-21 – 2023-12-16 (×2): qty 90, 90d supply, fill #0

## 2023-11-21 MED ORDER — LOSARTAN POTASSIUM 100 MG PO TABS
100.0000 mg | ORAL_TABLET | Freq: Every day | ORAL | 3 refills | Status: AC
  Filled 2023-11-21: qty 90, 90d supply, fill #0

## 2023-11-21 NOTE — Progress Notes (Unsigned)
 Subjective:   Peter Bell 09/11/1970 11/21/2023  Chief Complaint  Patient presents with   Hospitalization Follow-up    Hospital follow up had stroke 2 weeks ago was discharged 7/23 has been doing good has not had chest pain or anything is taking meds regularly can't feel hot or cold in left hand     HPI: Peter Bell presents today for re-assessment and management of chronic medical conditions.  HOSPITAL DISCHARGE:  Patient presents for hospital follow-up after presenting to the ER on 11/14/2023 with new onset of diplopia, trouble swallowing, and slurred speech.  His MRI showed a right paramedian pontine infarct the brain.  A noninvasive angiography showed left P1 occlusion with distal reconstitution, proximal basilar artery stenosis, mild, moderate right MCA A2 stenosis.  Echocardiogram was negative for source of embolism.  Carotid imaging was unremarkable.  Patient was sent home on atorvastatin  80 mg and his aspirin  and Plavix  were continued.  Patient recently was admitted to [facility], ER in June due to an NSTEMI with coronary artery stent placement and started on aspirin  and Plavix .  He has been compliant with his medications more recently.  He was outside of the tPA window with his recent admission.  Hospitalist did recommend outpatient PT, OT, and speech therapy.  Referral placed to [facility] neurological for follow-up.  He was recommended to follow-up with PCP for diabetic control and blood pressure control titration.  Patient states he has changed diet and is eating healthier in regards to heart healthy diet.  He is not exercising currently.  [facility] has tried to reach out to him per chart review, but his voicemail is full.  He will be provided the contact information.   DIABETES MELLITUS: Peter Bell presents for the medical management of diabetes.  Current diabetes medication regimen: Metformin  1000mg  every day, Jardiance  10mg  every day He has  increased his Metformin  from 500 to 1000mg  daily. He was previously having GI upset.  Patient is  adhering to a diabetic diet.  Patient is not exercising regularly.  Patient is  checking BS regularly. Patient is  checking their feet regularly.  Denies polydipsia, polyphagia, polyuria, open wounds or ulcers on feet.  Lab Results  Component Value Date   HGBA1C 8.8 (H) 09/26/2023    Foot Exam: 03/31/2023 Lab Results  Component Value Date   MICROALBUR 30 04/28/2023    Wt Readings from Last 3 Encounters:  11/21/23 129 lb 6.4 oz (58.7 kg)  11/14/23 131 lb (59.4 kg)  11/11/23 132 lb 0.9 oz (59.9 kg)     The following portions of the patient's history were reviewed and updated as appropriate: past medical history, past surgical history, family history, social history, allergies, medications, and problem list.   Patient Active Problem List   Diagnosis Date Noted   CAD S/P percutaneous coronary angioplasty 11/15/2023   Stroke (HCC) 11/14/2023   Chronic low back pain 10/26/2023   NSTEMI (non-ST elevated myocardial infarction) (HCC) 09/25/2023   Impaired gait 03/31/2023   Type 2 diabetes mellitus with hyperglycemia, without long-term current use of insulin  (HCC) 03/31/2023   Neuropathic pain 03/31/2023   Spinal stenosis of lumbar region 04/22/2020   Acute pain of left knee 09/17/2019   Protrusion of lumbar intervertebral disc 05/29/2019   SOB (shortness of breath) 05/10/2013   Essential hypertension, benign 04/24/2013   Chest pain, unspecified 04/24/2013   Past Medical History:  Diagnosis Date   Asthma    Diabetes mellitus without complication (HCC)  Eczema    GERD (gastroesophageal reflux disease)    Hypertension    Myocardial infarction [facility])    Sciatica    Past Surgical History:  Procedure Laterality Date   CORONARY STENT INTERVENTION N/A 09/26/2023   Procedure: CORONARY STENT INTERVENTION;  Surgeon: Swaziland, [name], MD;  Location: [facility] INVASIVE CV LAB;  Service:  Cardiovascular;  Laterality: N/A;   HERNIA REPAIR     LEFT HEART CATH AND CORONARY ANGIOGRAPHY N/A 09/26/2023   Procedure: LEFT HEART CATH AND CORONARY ANGIOGRAPHY;  Surgeon: Swaziland, [name], MD;  Location: [facility] INVASIVE CV LAB;  Service: Cardiovascular;  Laterality: N/A;   Family History  Problem Relation Age of Onset   Cancer Mother    Cancer Father    Hypertension Sister    Heart murmur Sister    CVA Brother    CVA Brother    Hypertension Brother    Outpatient Medications Prior to Visit  Medication Sig Dispense Refill   aspirin  EC 81 MG tablet Take 1 tablet (81 mg total) by mouth daily. Swallow whole. 90 tablet 3   clopidogrel  (PLAVIX ) 75 MG tablet Take 1 tablet (75 mg total) by mouth daily. 90 tablet 3   empagliflozin  (JARDIANCE ) 10 MG TABS tablet Take 1 tablet (10 mg total) by mouth daily. 30 tablet 11   metFORMIN  (GLUCOPHAGE ) 500 MG tablet Take 2 tablets (1,000 mg total) by mouth daily. Take with meal. 60 tablet 0   metoprolol  succinate (TOPROL  XL) 25 MG 24 hr tablet Take 1 tablet (25 mg total) by mouth daily. 90 tablet 3   nitroGLYCERIN  (NITROSTAT ) 0.4 MG SL tablet Place 1 tablet (0.4 mg total) under the tongue every 5 (five) minutes x 3 doses as needed for chest pain. 25 tablet 0   pantoprazole  (PROTONIX ) 20 MG tablet Take 1 tablet (20 mg total) by mouth daily. 30 tablet 0   amLODipine  (NORVASC ) 10 MG tablet Take 1 tablet (10 mg total) by mouth daily. 30 tablet 0   atorvastatin  (LIPITOR) 80 MG tablet Take 1 tablet (80 mg total) by mouth daily. 30 tablet 0   losartan  (COZAAR ) 50 MG tablet Take 1 tablet (50 mg total) by mouth daily. 90 tablet 3   No facility-administered medications prior to visit.   No Known Allergies   ROS: A complete ROS was performed with pertinent positives/negatives noted in the HPI. The remainder of the ROS are negative.    Objective:   Today's Vitals   11/21/23 1545  BP: (!) 133/94  Pulse: 91  SpO2: 100%  Weight: 129 lb 6.4 oz (58.7 kg)  Height:  5' 6 (1.676 m)  PainSc: 0-No pain    Physical Exam   GENERAL: Well-appearing, in NAD. Well nourished.  SKIN: Pink, warm and dry.  Head: Normocephalic. NECK: Trachea midline. Full ROM w/o pain or tenderness.  RESPIRATORY: Chest wall symmetrical. Respirations even and non-labored. Breath sounds clear to auscultation bilaterally.  CARDIAC: S1, S2 present, regular rate and rhythm without murmur or gallops. Peripheral pulses 2+ bilaterally.  MSK: Muscle tone and strength appropriate for age. Joints w/o tenderness, redness, or swelling.  EXTREMITIES: Without clubbing, cyanosis, or edema.  NEUROLOGIC: No motor or sensory deficits. Steady, even gait. C2-C12 intact.  PSYCH/MENTAL STATUS: Alert, oriented x 3. Cooperative, appropriate mood and affect.     Assessment & Plan:   1. Essential hypertension, benign Uncontrolled. Continue Amlodipine , Metoprolol  and increase Losartan  to 100mg  daily. Discussed monitoring his BP and BP goal. Patient verbalized understanding. Return in 3-4 weeks  for BP check.  - amLODipine  (NORVASC ) 10 MG tablet; Take 1 tablet (10 mg total) by mouth daily.  Dispense: 90 tablet; Refill: 3  2. Type 2 diabetes mellitus with hyperglycemia, without long-term current use of insulin  (HCC) Uncontrolled. We discussed needed dietary changes with patient in regards to diabetic diet and heart healthy diet.  We will stop her metformin  due to GI upset and start Rybelsus  3 mg for 4 weeks, followed by increase to Rybelsus  7 mg.  Will check patient's A1c in September.  Continue Jardiance  10 mg as prescribed.  Continue to monitor blood sugar and reviewed how to with patient.  3. Cerebrovascular accident (CVA) due to thrombosis of basilar artery (HCC) Discussed methods of prevention with patient and importance of medication adherence.  Patient's voicemail has been full and has not been able to schedule with neurology for follow-up.  Phone number from provided to patient of [facility]  and he will call to schedule.  He will also schedule PT, OT, and speech therapy.  4. Special screening for malignant neoplasms, colon (Primary) - Ambulatory referral to Gastroenterology  5. Hospital discharge follow-up Reviewed admission and reconciled discharge medications.  Patient will call to schedule follow-ups as referrals were placed and are still active.   Meds ordered this encounter  Medications   amLODipine  (NORVASC ) 10 MG tablet    Sig: Take 1 tablet (10 mg total) by mouth daily.    Dispense:  90 tablet    Refill:  3    Supervising Provider:   DE PERU, RAYMOND J [[id number]]   atorvastatin  (LIPITOR) 80 MG tablet    Sig: Take 1 tablet (80 mg total) by mouth daily.    Dispense:  90 tablet    Refill:  3    Supervising Provider:   DE PERU, RAYMOND J [[id number]]   losartan  (COZAAR ) 100 MG tablet    Sig: Take 1 tablet (100 mg total) by mouth daily.    Dispense:  90 tablet    Refill:  3    Supervising Provider:   DE PERU, RAYMOND J [[id number]]   Semaglutide  (RYBELSUS ) 3 MG TABS    Sig: Take 1 tablet (3 mg total) by mouth in morning on an empty stomach with up to 4 ounces of plain water only 30 minutes prior to eating.    Dispense:  30 tablet    Refill:  0    Supervising Provider:   DE PERU, RAYMOND J [[id number]]   Lab Orders  No laboratory test(s) ordered today    Return in about 3 weeks (around 12/12/2023) for Follow up HTN, Post CVA, and Diabetes Follow up .    Patient to reach out to office if new, worrisome, or unresolved symptoms arise or if no improvement in patient's condition. Patient verbalized understanding and is agreeable to treatment plan. All questions answered to patient's satisfaction.   A total of 65 minutes were spent on this encounter today including face to face, ordering and reviewing labs, reviewing patient's previous records from hospitalization, documenting in the record and ordering  medications and screenings.    Thersia Schuyler Stark, [place]

## 2023-11-21 NOTE — Patient Instructions (Addendum)
 STOP METFORMIN - DO NOT TAKE!    Losartan : Take 2 tablets of your Losartan  50mg  daily until you pick up your new Losartan  100 mg tablet and then you will only take 1 tablet.     Rybelsus : Take 1 tablet on empty stomach in the morning and eat 30 minutes after taking.    Call [facility] ASAP to schedule your follow up. Tell them you have a referral in place.  Address: [address], [place], [place] [address] Phone: [phone]   Call [facility] ASAP! Tell them you have a referral as well.  Address: [address], [place], [place] [address] Hours:  Closes soon ? 5?PM ? Opens 7:15?AM Tue Phone: [phone]

## 2023-11-22 ENCOUNTER — Other Ambulatory Visit (HOSPITAL_COMMUNITY): Payer: Self-pay

## 2023-11-22 ENCOUNTER — Telehealth (HOSPITAL_COMMUNITY): Payer: Self-pay

## 2023-11-22 NOTE — Telephone Encounter (Signed)
 Attempted f/u call regarding cardiac rehab- no answer, left message informing patient we will be closing his referral and to call us  back if he would like it reopened or has any questions. Sent MyChart message.  Closing referral.

## 2023-11-23 ENCOUNTER — Other Ambulatory Visit (HOSPITAL_BASED_OUTPATIENT_CLINIC_OR_DEPARTMENT_OTHER): Payer: Self-pay

## 2023-11-25 ENCOUNTER — Telehealth (HOSPITAL_COMMUNITY): Payer: Self-pay

## 2023-11-25 ENCOUNTER — Other Ambulatory Visit (HOSPITAL_BASED_OUTPATIENT_CLINIC_OR_DEPARTMENT_OTHER): Payer: Self-pay

## 2023-11-25 NOTE — Telephone Encounter (Signed)
 Patient left message asking about referral. Attempted to call patient back to confirm patient would like his referral reopened and to go over the program with him- no answer, left message.  Will reopen referral. Medicaid form needs to be sent to doctor, will check with nurse navigator as to which provider we will need to send it to.

## 2023-11-25 NOTE — Telephone Encounter (Signed)
 Reviewed with nurse navigator, faxing Medicaid form to Dr. Shlomo. Patient will need to attend f/u with neurologist on 8/20.

## 2023-11-28 ENCOUNTER — Other Ambulatory Visit (HOSPITAL_BASED_OUTPATIENT_CLINIC_OR_DEPARTMENT_OTHER): Payer: Self-pay

## 2023-11-29 DIAGNOSIS — H5203 Hypermetropia, bilateral: Secondary | ICD-10-CM | POA: Diagnosis not present

## 2023-11-29 LAB — HM DIABETES EYE EXAM

## 2023-12-01 ENCOUNTER — Other Ambulatory Visit (HOSPITAL_BASED_OUTPATIENT_CLINIC_OR_DEPARTMENT_OTHER): Payer: Self-pay

## 2023-12-01 ENCOUNTER — Telehealth: Payer: Self-pay

## 2023-12-01 ENCOUNTER — Ambulatory Visit: Attending: Family Medicine | Admitting: Speech Pathology

## 2023-12-01 ENCOUNTER — Encounter: Payer: Self-pay | Admitting: Speech Pathology

## 2023-12-01 DIAGNOSIS — R41844 Frontal lobe and executive function deficit: Secondary | ICD-10-CM | POA: Diagnosis not present

## 2023-12-01 DIAGNOSIS — R41840 Attention and concentration deficit: Secondary | ICD-10-CM | POA: Insufficient documentation

## 2023-12-01 DIAGNOSIS — M79661 Pain in right lower leg: Secondary | ICD-10-CM | POA: Insufficient documentation

## 2023-12-01 DIAGNOSIS — M79662 Pain in left lower leg: Secondary | ICD-10-CM | POA: Insufficient documentation

## 2023-12-01 DIAGNOSIS — R278 Other lack of coordination: Secondary | ICD-10-CM | POA: Diagnosis not present

## 2023-12-01 DIAGNOSIS — M48061 Spinal stenosis, lumbar region without neurogenic claudication: Secondary | ICD-10-CM | POA: Diagnosis not present

## 2023-12-01 DIAGNOSIS — I639 Cerebral infarction, unspecified: Secondary | ICD-10-CM | POA: Insufficient documentation

## 2023-12-01 DIAGNOSIS — M5459 Other low back pain: Secondary | ICD-10-CM | POA: Diagnosis not present

## 2023-12-01 DIAGNOSIS — R2689 Other abnormalities of gait and mobility: Secondary | ICD-10-CM | POA: Insufficient documentation

## 2023-12-01 DIAGNOSIS — R1312 Dysphagia, oropharyngeal phase: Secondary | ICD-10-CM | POA: Diagnosis not present

## 2023-12-01 DIAGNOSIS — R41842 Visuospatial deficit: Secondary | ICD-10-CM | POA: Insufficient documentation

## 2023-12-01 DIAGNOSIS — R471 Dysarthria and anarthria: Secondary | ICD-10-CM | POA: Insufficient documentation

## 2023-12-01 DIAGNOSIS — R41841 Cognitive communication deficit: Secondary | ICD-10-CM | POA: Diagnosis not present

## 2023-12-01 NOTE — Therapy (Signed)
 OUTPATIENT SPEECH LANGUAGE PATHOLOGY EVALUATION   Patient Name: Peter Bell MRN: [id number] DOB:1970-10-08, 53 y.o., male Today's Date: 12/05/2023  PCP: Knute Thersia Bitters, FNP REFERRING PROVIDER: Jonel Lonni SQUIBB, MD  END OF SESSION:    Past Medical History:  Diagnosis Date   Asthma    Diabetes mellitus without complication (HCC)    Eczema    GERD (gastroesophageal reflux disease)    Hypertension    Myocardial infarction [facility])    Sciatica    Past Surgical History:  Procedure Laterality Date   CORONARY STENT INTERVENTION N/A 09/26/2023   Procedure: CORONARY STENT INTERVENTION;  Surgeon: Swaziland, [name], MD;  Location: [facility] INVASIVE CV LAB;  Service: Cardiovascular;  Laterality: N/A;   HERNIA REPAIR     LEFT HEART CATH AND CORONARY ANGIOGRAPHY N/A 09/26/2023   Procedure: LEFT HEART CATH AND CORONARY ANGIOGRAPHY;  Surgeon: Swaziland, [name], MD;  Location: [facility] INVASIVE CV LAB;  Service: Cardiovascular;  Laterality: N/A;   Patient Active Problem List   Diagnosis Date Noted   CAD S/P percutaneous coronary angioplasty 11/15/2023   Stroke (HCC) 11/14/2023   Chronic low back pain 10/26/2023   NSTEMI (non-ST elevated myocardial infarction) (HCC) 09/25/2023   Impaired gait 03/31/2023   Type 2 diabetes mellitus with hyperglycemia, without long-term current use of insulin  (HCC) 03/31/2023   Neuropathic pain 03/31/2023   Spinal stenosis of lumbar region 04/22/2020   Acute pain of left knee 09/17/2019   Protrusion of lumbar intervertebral disc 05/29/2019   SOB (shortness of breath) 05/10/2013   Essential hypertension, benign 04/24/2013   Chest pain, unspecified 04/24/2013    ONSET DATE: Referred on 11/16/23   REFERRING DIAG: I63.9 (ICD-10-CM) - Acute ischemic stroke (HCC)   THERAPY DIAG:  Dysphagia, oropharyngeal phase  Cognitive communication deficit  Rationale for Evaluation and Treatment: Rehabilitation  SUBJECTIVE:   SUBJECTIVE STATEMENT: Pt was pleasant and  cooperative throughout assessment.   Pt accompanied by: self  PERTINENT HISTORY: stroke, type 2 diabetes, hypertension, CAD  PAIN:  Are you having pain? No  FALLS: Has patient fallen in last 6 months?  No  LIVING ENVIRONMENT: Lives with: lives with their partner; [place]  Lives in: House/apartment  PLOF:  Level of assistance: Independent with ADLs, Independent with IADLs Employment: Self-employed odds and ends; he does not plan to return to work (he is waiting for disability)   PATIENT GOALS: speech  OBJECTIVE:  Note: Objective measures were completed at Evaluation unless otherwise noted.  DIAGNOSTIC FINDINGS: Per EMR:  MR Brain W and Wo Contrast  IMPRESSION: 1. Small acute infarct within the right pons in the region of the paramedian pontine reticular formation, likely cause of diplopia. 2. 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 3. Multifocal hyperintense T2-weighted signal within the cerebral white matter, most commonly due to chronic small vessel disease. 4. Normal MRI of the orbits   Electronically signed by: Franky Stanford MD 11/14/2023 10:03 PM EDT RP  COGNITION: Overall cognitive status: Impaired Areas of impairment:  Memory: Impaired: Working Teacher, music term Designer, fashion/clothing function: Impaired: Organization, Planning, Error awareness, and Self-correction Functional deficits: Pt feels he could benefit from memory strategies; otherwise, would like to focus on speech/swallowing  MOTOR SPEECH: Overall motor speech: impaired Level of impairment: Conversation Respiration: thoracic breathing Phonation: normal Resonance: WFL Articulation: Impaired: sentence and conversation Intelligibility: Intelligibility reduced Motor planning: Appears intact Effective technique: slow rate and over articulate   ORAL MOTOR EXAMINATION: Overall status: Abnormal symmetry; suspected CN VII deficits  RECOMMENDATIONS FROM OBJECTIVE  SWALLOW STUDY (MBSS/FEES):   11/15/23 Clinical Impression: Clinical Impression: Patient presents with a mild oropharyngeal dysphagia characterized by right sided facial weakness (unclear cause), loss of bolus over the base of tongue prior to swallow initiation which is initated at the level of the pyriform sinuses, decreased anterior hyoid movement and decreased UES relaxation with resultant  vallecular and pyriform sinus residue post swallow. Full airway protection observed during today's study but risk of airway invasion is present given degree of residue. Patient does sense residue and responds with spontaenous dry swallows and request for liquid wash with solids which does aid in clearance. In addition, use of screen for biofeedback used to reinforce use of precautions. Recommend continuation fo current diet, OP SLP services to f/u for strengthening exercise program. Overall, cognitive-linguistic status appears to be at baseline wtihout need for formal evaluation in the acute care setting.  Recommendations/Plan: Swallowing Evaluation Recommendations Swallowing Evaluation Recommendations Recommendations: PO diet PO Diet Recommendation: Regular; Thin liquids (Level 0) Liquid Administration via: Cup; Straw Medication Administration: Whole meds with liquid Supervision: Patient able to self-feed Swallowing strategies  : Slow rate; Small bites/sips; Multiple dry swallows after each bite/sip; Follow solids with liquids Postural changes: Position pt fully upright for meals Oral care recommendations: Oral care BID (2x/day)  CLINICAL SWALLOW ASSESSMENT:   Current diet: regular and thin liquids Dentition: missing dentition Patient directly observed with POs: Yes: thin liquids ; no longer having trouble with any other food consistencies.  Feeding: able to feed self Liquids provided by: cup Oral phase signs and symptoms: NA Pharyngeal phase signs and symptoms: None observed or reported Comments: No longer having any trouble with  swallowing; he is continuing his swallow exercises at home.   STANDARDIZED ASSESSMENTS: SLUMS: 22  PATIENT REPORTED OUTCOME MEASURES (PROM): EAT-10: 4 Reports re: interfering with going out for meals, solids take extra effort, pleasure of eating is impacted by swallow, food sticks in throat when swallowing                                                                                                                             TREATMENT DATE:    PATIENT EDUCATION: Education details: SLP role Person educated: Patient Education method: Explanation Education comprehension: verbalized understanding and needs further education   GOALS: Goals reviewed with patient? Yes  SHORT TERM GOALS: Target date: 01/01/24  Pt will demonstrate use of pharyngeal strengthening exercises independently. Baseline: Goal status: INITIAL  2.  Pt will recall 3 memory strategies to be used for recall of important information with minA verbal cues. Baseline:  Goal status: INITIAL  3.  Pt will recall speech intelligibility strategies with minA verbal cues.  Baseline:  Goal status: INITIAL    LONG TERM GOALS: Target date: 01/31/24  Pt will improve score on PROM Baseline:  Goal status: INITIAL  2.  Pt will report success using speech intelligibility strategies.  Baseline:  Goal status: INITIAL  3.  Pt will report success  using memory strategies at home/in community. Baseline:  Goal status: INITIAL    ASSESSMENT:  CLINICAL IMPRESSION: Pt is a 53 yo male who presents to ST OP for evaluation post CVA. Pt endorses he was sent for swallow evaluation - he feels like his swallow is completely better - he reports he does have some feelings of residue occasionally, but it is not consistently. Pt feels like his speech is still slurred which is a concern. He reports his thinking skills are at baseline and has always had a bad memory. Pt was assessed using SLUMS - see score above. SLP observed pt to  require repetition of instructions and executive functioning (organizing, planning, error awareness). Pt reports his speech is slurred and this is something he would like to work on along with memory strategies. SLP rec skilled ST services to address cognitive-communication impairment.    OBJECTIVE IMPAIRMENTS: include memory, executive functioning, dysarthria, and dysphagia. These impairments are limiting patient from managing medications, managing appointments, managing finances, household responsibilities, effectively communicating at home and in community, and safety when swallowing. Factors affecting potential to achieve goals and functional outcome are NA. Patient will benefit from skilled SLP services to address above impairments and improve overall function.  REHAB POTENTIAL: Good  PLAN:  SLP FREQUENCY: 1x/week  SLP DURATION: 8 weeks  PLANNED INTERVENTIONS: Aspiration precaution training, Pharyngeal strengthening exercises, Diet toleration management , Environmental controls, Cueing hierachy, Internal/external aids, Functional tasks, SLP instruction and feedback, Compensatory strategies, Patient/family education, [phone] Treatment of speech (30 or 45 min) , and [id number] Treatment of swallowing function    [employer], CCC-SLP 12/05/2023, 9:19 AM

## 2023-12-01 NOTE — Therapy (Signed)
 OUTPATIENT PHYSICAL THERAPY NEURO EVALUATION   Patient Name: Peter Bell MRN: [id number] DOB:November 30, 1970, 53 y.o., male Today's Date: 12/05/2023   PCP: Peter Bell REFERRING PROVIDER: Lonni Bell  END OF SESSION:  PT End of Session - 12/05/23 1314     Visit Number 1    Date for PT Re-Evaluation 02/13/24    Authorization Type [place] Medicaid [employer]    PT Start Time 1315    PT Stop Time 1350    PT Time Calculation (min) 35 min          Past Medical History:  Diagnosis Date   Asthma    Diabetes mellitus without complication (HCC)    Eczema    GERD (gastroesophageal reflux disease)    Hypertension    Myocardial infarction [facility])    Sciatica    Past Surgical History:  Procedure Laterality Date   CORONARY STENT INTERVENTION N/A 09/26/2023   Procedure: CORONARY STENT INTERVENTION;  Surgeon: Swaziland, [name], MD;  Location: [facility] INVASIVE CV LAB;  Service: Cardiovascular;  Laterality: N/A;   HERNIA REPAIR     LEFT HEART CATH AND CORONARY ANGIOGRAPHY N/A 09/26/2023   Procedure: LEFT HEART CATH AND CORONARY ANGIOGRAPHY;  Surgeon: Swaziland, [name], MD;  Location: [facility] INVASIVE CV LAB;  Service: Cardiovascular;  Laterality: N/A;   Patient Active Problem List   Diagnosis Date Noted   CAD S/P percutaneous coronary angioplasty 11/15/2023   Stroke (HCC) 11/14/2023   Chronic low back pain 10/26/2023   NSTEMI (non-ST elevated myocardial infarction) (HCC) 09/25/2023   Impaired gait 03/31/2023   Type 2 diabetes mellitus with hyperglycemia, without long-term current use of insulin  (HCC) 03/31/2023   Neuropathic pain 03/31/2023   Spinal stenosis of lumbar region 04/22/2020   Acute pain of left knee 09/17/2019   Protrusion of lumbar intervertebral disc 05/29/2019   SOB (shortness of breath) 05/10/2013   Essential hypertension, benign 04/24/2013   Chest pain, unspecified 04/24/2013    ONSET DATE: 11/14/23  REFERRING DIAG:  I63.9 (ICD-10-CM) - Acute ischemic stroke (HCC)     THERAPY DIAG:  Cerebrovascular accident (CVA), unspecified mechanism (HCC)  Spinal stenosis of lumbar region, unspecified whether neurogenic claudication present  Other abnormalities of gait and mobility  Other low back pain  Pain in both lower legs  Rationale for Evaluation and Treatment: Rehabilitation  SUBJECTIVE:                                                                                                                                                                                             SUBJECTIVE STATEMENT: I am doing alright. I have spinal stenosis so I  hurt when I walk or if I am standing for a while. I have a doctor's appointment with my back last week. Whatever exercise I can do to make it better, I am all for it. The way I am walking is how I walked for over a year and a half now.    PERTINENT HISTORY: [name] is a 53 y.o. male with medical history significant of DM2, CAD spinal stenosis   Presented with change in sensation left arm Hx of DM2, CAD, HTN. Here with 4 days of trouble swallowing, numbness of left hand and arm, double vision. MRI showing acute CVA in the pons Unable to detect on cold or hot in his left hand and arm for past 4 days. No weakness in the left arm no change in sensation or strength in his left leg. Did feel that his speech was slurred  PAIN:  Are you having pain? Yes: NPRS scale: 8/10 when standing or walking, no pain when sitting Pain location: legs  Pain description: feelings like charlie horses in my hamstrings all the time  Aggravating factors: walking and standing  Relieving factors: nothing really  PRECAUTIONS: Fall  RED FLAGS: None   WEIGHT BEARING RESTRICTIONS: No  FALLS: Has patient fallen in last 6 months? No  LIVING ENVIRONMENT: Lives with: lives with an adult companion Lives in: House/apartment Stairs: Yes: Internal: 18 steps; on left going up Has following equipment at home: Single point cane and  Walker - 4 wheeled  PLOF: Independent, Independent with basic ADLs, and Independent with gait  PATIENT GOALS: to get better than what I am   OBJECTIVE:  Note: Objective measures were completed at Evaluation unless otherwise noted.  DIAGNOSTIC FINDINGS:  IMPRESSION: 1. 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 2. Mild stenosis of the proximal basilar artery. 3. Moderate stenosis of the right anterior cerebral artery A2 segment.  MPRESSION: 1. Small acute infarct within the right pons in the region of the paramedian pontine reticular formation, likely cause of diplopia. 2. 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 3. Multifocal hyperintense T2-weighted signal within the cerebral white matter, most commonly due to chronic small vessel disease. 4. Normal MRI of the orbits  COGNITION: Overall cognitive status: Within functional limits for tasks assessed   SENSATION: WFL Hot/Cold: Impaired in LUE  COORDINATION: Decreased coordination   MUSCLE LENGTH: Hamstrings: very tight in HS  POSTURE: rounded shoulders, decreased lumbar lordosis, and R shoulder is lowered   LOWER EXTREMITY ROM:   WFL    LOWER EXTREMITY MMT:  5/5 BLE   GAIT: Findings: Gait Characteristics: decreased arm swing- Right, decreased step length- Left, decreased stance time- Right, decreased stride length, decreased hip/knee flexion- Right, ataxic, antalgic, wide BOS, and poor foot clearance- Right, Distance walked: in clinic distances, and Comments: antalgic gait that he reports has been like this for almost 2 years and says it is due to spinal stenosis and pain in back of legs   FUNCTIONAL TESTS:  5 times sit to stand: 10s Timed up and go (TUG): 16s  TREATMENT DATE: 12/05/23- EVAL, POC    PATIENT EDUCATION: Education details: POC, HEP, fall risk,  Person  educated: Patient Education method: Explanation Education comprehension: verbalized understanding  HOME EXERCISE PROGRAM: Access Code: [email or web address] URL: [email or web address] Date: 12/05/2023 Prepared by: Peter Bell  Exercises - Sit to Stand  - 1 x daily - 7 x weekly - 2 sets - 10 reps - Seated Hamstring Stretch  - 1 x daily - 7 x weekly - 3 reps - 15 hold - Standing Hip Abduction with Counter Support  - 1 x daily - 7 x weekly - 2 sets - 10 reps - Supine Bridge  - 1 x daily - 7 x weekly - 2 sets - 10 reps  GOALS: Goals reviewed with patient? Yes  SHORT TERM GOALS: Target date: 01/09/24  Patient will be independent with initial HEP.  Baseline:  Goal status: INITIAL  2.  Patient will complete TUG < 13s  Baseline: 16 Goal status: INITIAL   LONG TERM GOALS: Target date: 02/13/24  Patient will be independent with advanced/ongoing HEP to improve outcomes and carryover.  Baseline:  Goal status: INITIAL  2.  Patient will report 50% improvement in low back and hamstrings to improve QOL.  Baseline: 8/10 with activity  Goal status: INITIAL  3.  Patient will improve BERG to 50/56 to decrease risk for falls.  Baseline: 42/56 Goal status: INITIAL  4.  Patient will be able to walk 100-248ft with normalized gait pattern.    Baseline: limping on RLE Goal status: INITIAL  5.  Patient will tolerate 15-20 min of standing/sitting/walking. Baseline: < 5 mins  Goal status: INITIAL   ASSESSMENT:  CLINICAL IMPRESSION: Patient is a 53 y.o. male who was seen today for physical therapy evaluation and treatment for CVA. He is ambulating with a significant limp and attributes it to pain in his lower legs, especially on the R side. He report he has been walking like this since before the stroke and states it is due to spinal stenosis. He has a cane that he uses sometimes. Patient has decent range of motion and strength but his safety awareness is not the best. He will benefit from  skilled PT to address his gait, balance, and pain to improve QOL and decrease his risk for falls.   OBJECTIVE IMPAIRMENTS: Abnormal gait, decreased balance, decreased endurance, difficulty walking, increased muscle spasms, improper body mechanics, postural dysfunction, and pain.   ACTIVITY LIMITATIONS: carrying, lifting, bending, standing, squatting, stairs, and locomotion level  PARTICIPATION LIMITATIONS: cleaning, laundry, shopping, community activity, occupation, and yard work  PERSONAL FACTORS: Education, Fitness, Past/current experiences, and 1-2 comorbidities: chronic pain, impaired gait, neuropathic pain, DM, Stroke are also affecting patient's functional outcome.   REHAB POTENTIAL: Good  CLINICAL DECISION MAKING: Evolving/moderate complexity  EVALUATION COMPLEXITY: Moderate  PLAN:  PT FREQUENCY: 1-2x/week  PT DURATION: 10 weeks  PLANNED INTERVENTIONS: 97110-Therapeutic exercises, 97530- Therapeutic activity, 97112- Neuromuscular re-education, 97535- Self Care, 02859- Manual therapy, [phone]- Gait training, [phone]- Electrical stimulation (unattended), [phone] (1-2 muscles), [phone] (3+ muscles)- Dry Needling, Patient/Family education, Balance training, Stair training, Taping, Joint mobilization, Joint manipulation, Spinal manipulation, Spinal mobilization, Cryotherapy, and Moist heat  PLAN FOR NEXT SESSION: balance, gait training, LE stretching and strengthening   Peter Bell, PT 12/05/2023, 2:00 PM

## 2023-12-01 NOTE — Telephone Encounter (Signed)
 Received Cardiac Rehab form from [facility].Dr.[name] completed.Form faxed to fax # [phone].

## 2023-12-02 ENCOUNTER — Other Ambulatory Visit (HOSPITAL_BASED_OUTPATIENT_CLINIC_OR_DEPARTMENT_OTHER): Payer: Self-pay

## 2023-12-02 ENCOUNTER — Telehealth (HOSPITAL_COMMUNITY): Payer: Self-pay

## 2023-12-02 NOTE — Telephone Encounter (Signed)
 Pt insurance is active and benefits verified through [employer]. Co-pay $0, DED $0/$0 met, out of pocket $0/$0 met, co-insurance 0%. No pre-authorization required. 12/02/2023 @ 10:50am, spoke with Leotis BROCKS., REF# Leotis MOTE [phone]  TCR/ICR? TCR Visit(date of service)limitation? No Can multiple codes be used on the same date of service/visit?(IF ITS A LIMIT) Yes  Is this a lifetime maximum or an annual maximum? Annual Has the member used any of these services to date? No Is there a time limit (weeks/months) on start of program and/or program completion? 6 months of event (6/01)  Schedule full 12 week, 36 day program.

## 2023-12-05 ENCOUNTER — Other Ambulatory Visit (HOSPITAL_BASED_OUTPATIENT_CLINIC_OR_DEPARTMENT_OTHER): Payer: Self-pay

## 2023-12-05 ENCOUNTER — Ambulatory Visit

## 2023-12-05 DIAGNOSIS — R41844 Frontal lobe and executive function deficit: Secondary | ICD-10-CM | POA: Diagnosis not present

## 2023-12-05 DIAGNOSIS — R278 Other lack of coordination: Secondary | ICD-10-CM | POA: Diagnosis not present

## 2023-12-05 DIAGNOSIS — R41841 Cognitive communication deficit: Secondary | ICD-10-CM | POA: Diagnosis not present

## 2023-12-05 DIAGNOSIS — I639 Cerebral infarction, unspecified: Secondary | ICD-10-CM

## 2023-12-05 DIAGNOSIS — M48061 Spinal stenosis, lumbar region without neurogenic claudication: Secondary | ICD-10-CM | POA: Diagnosis not present

## 2023-12-05 DIAGNOSIS — R41840 Attention and concentration deficit: Secondary | ICD-10-CM | POA: Diagnosis not present

## 2023-12-05 DIAGNOSIS — M79661 Pain in right lower leg: Secondary | ICD-10-CM | POA: Diagnosis not present

## 2023-12-05 DIAGNOSIS — R1312 Dysphagia, oropharyngeal phase: Secondary | ICD-10-CM | POA: Diagnosis not present

## 2023-12-05 DIAGNOSIS — R41842 Visuospatial deficit: Secondary | ICD-10-CM | POA: Diagnosis not present

## 2023-12-05 DIAGNOSIS — M5459 Other low back pain: Secondary | ICD-10-CM

## 2023-12-05 DIAGNOSIS — M79662 Pain in left lower leg: Secondary | ICD-10-CM | POA: Diagnosis not present

## 2023-12-05 DIAGNOSIS — R471 Dysarthria and anarthria: Secondary | ICD-10-CM | POA: Diagnosis not present

## 2023-12-05 DIAGNOSIS — R2689 Other abnormalities of gait and mobility: Secondary | ICD-10-CM

## 2023-12-06 ENCOUNTER — Telehealth (HOSPITAL_BASED_OUTPATIENT_CLINIC_OR_DEPARTMENT_OTHER): Payer: Self-pay | Admitting: *Deleted

## 2023-12-06 ENCOUNTER — Ambulatory Visit: Payer: Self-pay | Admitting: Pharmacist

## 2023-12-06 ENCOUNTER — Telehealth: Payer: Self-pay | Admitting: Pharmacy Technician

## 2023-12-06 ENCOUNTER — Other Ambulatory Visit (HOSPITAL_BASED_OUTPATIENT_CLINIC_OR_DEPARTMENT_OTHER): Payer: Self-pay

## 2023-12-06 ENCOUNTER — Other Ambulatory Visit (HOSPITAL_COMMUNITY): Payer: Self-pay

## 2023-12-06 NOTE — Telephone Encounter (Signed)
 Copied from CRM [id number]. Topic: General - Other >> Dec 06, 2023  1:57 PM Delon DASEN wrote: Reason for CRM: Patient needs medical clearance for dental work- [phone]

## 2023-12-06 NOTE — Telephone Encounter (Signed)
 Pt has appt scheduled 8/18 with Alexis. Clearance can be done at this appt.

## 2023-12-06 NOTE — Telephone Encounter (Signed)
 Pharmacy Patient Advocate Encounter   Received notification from Fax that prior authorization for Jardiance  10MG  is required/requested.   Insurance verification completed.   The patient is insured through [facility] .   Per test claim: PA required; PA submitted to above mentioned insurance via Latent Key/confirmation #/EOC [id number] Status is pending

## 2023-12-07 ENCOUNTER — Other Ambulatory Visit (HOSPITAL_BASED_OUTPATIENT_CLINIC_OR_DEPARTMENT_OTHER): Payer: Self-pay

## 2023-12-07 NOTE — Telephone Encounter (Signed)
 Pharmacy Patient Advocate Encounter  Received notification from [facility] that Prior Authorization for Jardiance  has been APPROVED from 12/06/23 to 12/05/24   PA #/Case ID/Reference #: [id number]

## 2023-12-08 ENCOUNTER — Ambulatory Visit (HOSPITAL_BASED_OUTPATIENT_CLINIC_OR_DEPARTMENT_OTHER): Payer: Self-pay | Admitting: Family Medicine

## 2023-12-09 ENCOUNTER — Encounter: Payer: Self-pay | Admitting: Physician Assistant

## 2023-12-12 ENCOUNTER — Telehealth (HOSPITAL_BASED_OUTPATIENT_CLINIC_OR_DEPARTMENT_OTHER): Payer: Self-pay | Admitting: *Deleted

## 2023-12-12 ENCOUNTER — Ambulatory Visit (HOSPITAL_BASED_OUTPATIENT_CLINIC_OR_DEPARTMENT_OTHER): Admitting: Family Medicine

## 2023-12-12 NOTE — Telephone Encounter (Signed)
 Please have patient call dentist that needs clearance and have them fax over request for clearance for patient

## 2023-12-12 NOTE — Telephone Encounter (Signed)
 Lvm for patient to call dentist to get clearance paperwork faxed over to office

## 2023-12-12 NOTE — Telephone Encounter (Signed)
 Copied from CRM #[id number]. Topic: Clinical - Medical Advice >> Dec 12, 2023 11:47 AM Essie A wrote: Reason for CRM: Patient need a note from the doctor to get his teeth cleaned giving clearance to have this done.  Please respond by phone or mychart to let him know about the clearance note.  His phone number [phone].  Thanks.

## 2023-12-12 NOTE — Therapy (Signed)
 OUTPATIENT PHYSICAL THERAPY NEURO TREATMENT   Patient Name: Peter Bell MRN: [id number] DOB:1970/12/31, 53 y.o., male Today's Date: 12/13/2023   PCP: Thersia Stark REFERRING PROVIDER: Lonni Dalton  END OF SESSION:  PT End of Session - 12/13/23 1047     Visit Number 2    Date for PT Re-Evaluation 02/13/24    Authorization Type [place] Medicaid [employer]    PT Start Time 1055    PT Stop Time 1140    PT Time Calculation (min) 45 min           Past Medical History:  Diagnosis Date   Asthma    Diabetes mellitus without complication (HCC)    Eczema    GERD (gastroesophageal reflux disease)    Hypertension    Myocardial infarction [facility])    Sciatica    Past Surgical History:  Procedure Laterality Date   CORONARY STENT INTERVENTION N/A 09/26/2023   Procedure: CORONARY STENT INTERVENTION;  Surgeon: Swaziland, [name], MD;  Location: [facility] INVASIVE CV LAB;  Service: Cardiovascular;  Laterality: N/A;   HERNIA REPAIR     LEFT HEART CATH AND CORONARY ANGIOGRAPHY N/A 09/26/2023   Procedure: LEFT HEART CATH AND CORONARY ANGIOGRAPHY;  Surgeon: Swaziland, [name], MD;  Location: [facility] INVASIVE CV LAB;  Service: Cardiovascular;  Laterality: N/A;   Patient Active Problem List   Diagnosis Date Noted   CAD S/P percutaneous coronary angioplasty 11/15/2023   Stroke (HCC) 11/14/2023   Chronic low back pain 10/26/2023   NSTEMI (non-ST elevated myocardial infarction) (HCC) 09/25/2023   Impaired gait 03/31/2023   Type 2 diabetes mellitus with hyperglycemia, without long-term current use of insulin  (HCC) 03/31/2023   Neuropathic pain 03/31/2023   Spinal stenosis of lumbar region 04/22/2020   Acute pain of left knee 09/17/2019   Protrusion of lumbar intervertebral disc 05/29/2019   SOB (shortness of breath) 05/10/2013   Essential hypertension, benign 04/24/2013   Chest pain, unspecified 04/24/2013    ONSET DATE: 11/14/23  REFERRING DIAG:  I63.9 (ICD-10-CM) - Acute ischemic stroke (HCC)     THERAPY DIAG:  Acute ischemic stroke (HCC)  Cerebrovascular accident (CVA), unspecified mechanism (HCC)  Spinal stenosis of lumbar region, unspecified whether neurogenic claudication present  Other abnormalities of gait and mobility  Other low back pain  Rationale for Evaluation and Treatment: Rehabilitation  SUBJECTIVE:                                                                                                                                                                                             SUBJECTIVE STATEMENT: The back of the legs are still hurting.  PERTINENT HISTORY: Peter Bell is a 53 y.o. male with medical history significant of DM2, CAD spinal stenosis   Presented with change in sensation left arm Hx of DM2, CAD, HTN. Here with 4 days of trouble swallowing, numbness of left hand and arm, double vision. MRI showing acute CVA in the pons Unable to detect on cold or hot in his left hand and arm for past 4 days. No weakness in the left arm no change in sensation or strength in his left leg. Did feel that his speech was slurred  PAIN:  Are you having pain? Yes: NPRS scale: 8/10 when standing or walking, no pain when sitting Pain location: legs  Pain description: feelings like charlie horses in my hamstrings all the time  Aggravating factors: walking and standing  Relieving factors: nothing really  PRECAUTIONS: Fall  RED FLAGS: None   WEIGHT BEARING RESTRICTIONS: No  FALLS: Has patient fallen in last 6 months? No  LIVING ENVIRONMENT: Lives with: lives with an adult companion Lives in: House/apartment Stairs: Yes: Internal: 18 steps; on left going up Has following equipment at home: Single point cane and Walker - 4 wheeled  PLOF: Independent, Independent with basic ADLs, and Independent with gait  PATIENT GOALS: to get better than what I am   OBJECTIVE:  Note: Objective measures were completed at Evaluation unless otherwise  noted.  DIAGNOSTIC FINDINGS:  IMPRESSION: 1. 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 2. Mild stenosis of the proximal basilar artery. 3. Moderate stenosis of the right anterior cerebral artery A2 segment.  MPRESSION: 1. Small acute infarct within the right pons in the region of the paramedian pontine reticular formation, likely cause of diplopia. 2. 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 3. Multifocal hyperintense T2-weighted signal within the cerebral white matter, most commonly due to chronic small vessel disease. 4. Normal MRI of the orbits  COGNITION: Overall cognitive status: Within functional limits for tasks assessed   SENSATION: WFL Hot/Cold: Impaired in LUE  COORDINATION: Decreased coordination   MUSCLE LENGTH: Hamstrings: very tight in HS  POSTURE: rounded shoulders, decreased lumbar lordosis, and R shoulder is lowered   LOWER EXTREMITY ROM:   WFL    LOWER EXTREMITY MMT:  5/5 BLE   GAIT: Findings: Gait Characteristics: decreased arm swing- Right, decreased step length- Left, decreased stance time- Right, decreased stride length, decreased hip/knee flexion- Right, ataxic, antalgic, wide BOS, and poor foot clearance- Right, Distance walked: in clinic distances, and Comments: antalgic gait that he reports has been like this for almost 2 years and says it is due to spinal stenosis and pain in back of legs   FUNCTIONAL TESTS:  5 times sit to stand: 10s Timed up and go (TUG): 16s                                                                                                                              TREATMENT DATE:  12/13/23 NuStep  L5 x76mins Leg ext 10# 2x10 HS curls 25# 2x10 Shoulder ext 5# 2x10 STS 2x10 Step ups 6 2 way hip 2# in bars 2x12 Rows and ext green 2x15 Horizontal abd 2x15 Calf stretch 20s x3   12/05/23- EVAL, POC    PATIENT EDUCATION: Education details: POC, HEP, fall risk,  Person educated:  Patient Education method: Explanation Education comprehension: verbalized understanding  HOME EXERCISE PROGRAM: Access Code: [email or web address] URL: [email or web address] Date: 12/05/2023 Prepared by: Almetta Fam  Exercises - Sit to Stand  - 1 x daily - 7 x weekly - 2 sets - 10 reps - Seated Hamstring Stretch  - 1 x daily - 7 x weekly - 3 reps - 15 hold - Standing Hip Abduction with Counter Support  - 1 x daily - 7 x weekly - 2 sets - 10 reps - Supine Bridge  - 1 x daily - 7 x weekly - 2 sets - 10 reps  GOALS: Goals reviewed with patient? Yes  SHORT TERM GOALS: Target date: 01/09/24  Patient will be independent with initial HEP.  Baseline:  Goal status: INITIAL  2.  Patient will complete TUG < 13s  Baseline: 16 Goal status: INITIAL   LONG TERM GOALS: Target date: 02/13/24  Patient will be independent with advanced/ongoing HEP to improve outcomes and carryover.  Baseline:  Goal status: INITIAL  2.  Patient will report 50% improvement in low back and hamstrings to improve QOL.  Baseline: 8/10 with activity  Goal status: INITIAL  3.  Patient will improve BERG to 50/56 to decrease risk for falls.  Baseline: 42/56 Goal status: INITIAL  4.  Patient will be able to walk 100-265ft with normalized gait pattern.    Baseline: limping on RLE Goal status: INITIAL  5.  Patient will tolerate 15-20 min of standing/sitting/walking. Baseline: < 5 mins  Goal status: INITIAL   ASSESSMENT:  CLINICAL IMPRESSION: Patient is a 52 y.o. male who was seen today for physical therapy treatment for CVA. He is ambulating with a significant limp and attributes it to pain in his lower legs, especially on the R side. He report he has been walking like this since before the stroke and states it is due to spinal stenosis. We worked on strengthening, functional tasks, and postural strengthening today. He has some pain and difficulty with step ups. Reports soreness at end of visit. Will benefit  from skilled PT to address his gait, balance, and pain to improve QOL and decrease his risk for falls.   OBJECTIVE IMPAIRMENTS: Abnormal gait, decreased balance, decreased endurance, difficulty walking, increased muscle spasms, improper body mechanics, postural dysfunction, and pain.   ACTIVITY LIMITATIONS: carrying, lifting, bending, standing, squatting, stairs, and locomotion level  PARTICIPATION LIMITATIONS: cleaning, laundry, shopping, community activity, occupation, and yard work  PERSONAL FACTORS: Education, Fitness, Past/current experiences, and 1-2 comorbidities: chronic pain, impaired gait, neuropathic pain, DM, Stroke are also affecting patient's functional outcome.   REHAB POTENTIAL: Good  CLINICAL DECISION MAKING: Evolving/moderate complexity  EVALUATION COMPLEXITY: Moderate  PLAN:  PT FREQUENCY: 1-2x/week  PT DURATION: 10 weeks  PLANNED INTERVENTIONS: 97110-Therapeutic exercises, 97530- Therapeutic activity, 97112- Neuromuscular re-education, 97535- Self Care, 02859- Manual therapy, [phone]- Gait training, [phone]- Electrical stimulation (unattended), [phone] (1-2 muscles), 20561 (3+ muscles)- Dry Needling, Patient/Family education, Balance training, Stair training, Taping, Joint mobilization, Joint manipulation, Spinal manipulation, Spinal mobilization, Cryotherapy, and Moist heat  PLAN FOR NEXT SESSION: balance, gait training, LE stretching and strengthening   Almetta Fam, PT 12/13/2023, 11:37 AM

## 2023-12-13 ENCOUNTER — Ambulatory Visit

## 2023-12-13 ENCOUNTER — Ambulatory Visit: Admitting: Speech Pathology

## 2023-12-13 ENCOUNTER — Other Ambulatory Visit: Payer: Self-pay

## 2023-12-13 ENCOUNTER — Encounter: Payer: Self-pay | Admitting: Occupational Therapy

## 2023-12-13 ENCOUNTER — Ambulatory Visit: Admitting: Occupational Therapy

## 2023-12-13 DIAGNOSIS — M5459 Other low back pain: Secondary | ICD-10-CM | POA: Diagnosis not present

## 2023-12-13 DIAGNOSIS — R2689 Other abnormalities of gait and mobility: Secondary | ICD-10-CM | POA: Diagnosis not present

## 2023-12-13 DIAGNOSIS — R41840 Attention and concentration deficit: Secondary | ICD-10-CM

## 2023-12-13 DIAGNOSIS — R471 Dysarthria and anarthria: Secondary | ICD-10-CM | POA: Diagnosis not present

## 2023-12-13 DIAGNOSIS — M79662 Pain in left lower leg: Secondary | ICD-10-CM | POA: Diagnosis not present

## 2023-12-13 DIAGNOSIS — R1312 Dysphagia, oropharyngeal phase: Secondary | ICD-10-CM | POA: Diagnosis not present

## 2023-12-13 DIAGNOSIS — M79661 Pain in right lower leg: Secondary | ICD-10-CM | POA: Diagnosis not present

## 2023-12-13 DIAGNOSIS — I639 Cerebral infarction, unspecified: Secondary | ICD-10-CM

## 2023-12-13 DIAGNOSIS — R41841 Cognitive communication deficit: Secondary | ICD-10-CM | POA: Diagnosis not present

## 2023-12-13 DIAGNOSIS — R278 Other lack of coordination: Secondary | ICD-10-CM

## 2023-12-13 DIAGNOSIS — R41844 Frontal lobe and executive function deficit: Secondary | ICD-10-CM | POA: Diagnosis not present

## 2023-12-13 DIAGNOSIS — M48061 Spinal stenosis, lumbar region without neurogenic claudication: Secondary | ICD-10-CM | POA: Diagnosis not present

## 2023-12-13 DIAGNOSIS — R41842 Visuospatial deficit: Secondary | ICD-10-CM | POA: Diagnosis not present

## 2023-12-13 NOTE — Patient Instructions (Signed)
 Hold a card in front of you, make sure it is not double move the card side to side 5-10 res within the range that it stys 1 image only, NOT double perfrom 2x day

## 2023-12-13 NOTE — Therapy (Addendum)
 OUTPATIENT OCCUPATIONAL THERAPY NEURO EVALUATION  Patient Name: Peter Bell MRN: [id number] DOB:January 14, 1971, 53 y.o., male Today's Date: 12/13/2023  PCP: Dr. Knute REFERRING PROVIDER: Dr. Jonel  END OF SESSION:  OT End of Session - 12/13/23 1112     Visit Number 1    Number of Visits 11    Date for OT Re-Evaluation 02/28/24    Authorization Type [employer] MCD    Authorization Time Period 11 weeks    OT Start Time 1020    OT Stop Time 1055    OT Time Calculation (min) 35 min          Past Medical History:  Diagnosis Date   Asthma    Diabetes mellitus without complication (HCC)    Eczema    GERD (gastroesophageal reflux disease)    Hypertension    Myocardial infarction [facility])    Sciatica    Past Surgical History:  Procedure Laterality Date   CORONARY STENT INTERVENTION N/A 09/26/2023   Procedure: CORONARY STENT INTERVENTION;  Surgeon: Swaziland, [name], MD;  Location: [facility] INVASIVE CV LAB;  Service: Cardiovascular;  Laterality: N/A;   HERNIA REPAIR     LEFT HEART CATH AND CORONARY ANGIOGRAPHY N/A 09/26/2023   Procedure: LEFT HEART CATH AND CORONARY ANGIOGRAPHY;  Surgeon: Swaziland, [name], MD;  Location: [facility] INVASIVE CV LAB;  Service: Cardiovascular;  Laterality: N/A;   Patient Active Problem List   Diagnosis Date Noted   CAD S/P percutaneous coronary angioplasty 11/15/2023   Stroke (HCC) 11/14/2023   Chronic low back pain 10/26/2023   NSTEMI (non-ST elevated myocardial infarction) (HCC) 09/25/2023   Impaired gait 03/31/2023   Type 2 diabetes mellitus with hyperglycemia, without long-term current use of insulin  (HCC) 03/31/2023   Neuropathic pain 03/31/2023   Spinal stenosis of lumbar region 04/22/2020   Acute pain of left knee 09/17/2019   Protrusion of lumbar intervertebral disc 05/29/2019   SOB (shortness of breath) 05/10/2013   Essential hypertension, benign 04/24/2013   Chest pain, unspecified 04/24/2013    ONSET DATE: 11/14/23  REFERRING DIAG:  I63.9  (ICD-10-CM) - Acute ischemic stroke (HCC)  THERAPY DIAG:  Acute ischemic stroke (HCC)  Rationale for Evaluation and Treatment: Rehabilitation  SUBJECTIVE:   SUBJECTIVE STATEMENT: Pt reports back issues Pt accompanied by: self  PERTINENT HISTORY: 54 y.o. male hospitalized 7/21 with difficulty swallowing, diplopia, LUE numbness for 4 days. MRI brain with small acute infarct within the R pons in the region of the paramedian pontine reticular formation. PMH significant of DM2, CAD, HTN, spinal stenosis, asthma  PRECAUTIONS: Fall  WEIGHT BEARING RESTRICTIONS: No  PAIN:  Are you having pain? Yes: NPRS scale: 7-9/10 Pain location: legs Pain description: aching Aggravating factors: walking Relieving factors: rest  FALLS: Has patient fallen in last 6 months? No  LIVING ENVIRONMENT: Lives with: lives with an adult companion Lives in: House/apartment   PLOF: Independent  PATIENT GOALS: maintain independence  OBJECTIVE:  Note: Objective measures were completed at Evaluation unless otherwise noted.  HAND DOMINANCE: Right  ADLs:mod I with all basic ADLS   IADLs: Pt reports at hospital cleared him to drive  Light housekeeping: girlfriend performs Meal Prep: pt does not cook Community mobility: mod I Medication management: pt handles Financial management: pt handles   MOBILITY STATUS: mod I, decreased balnce- pt reports balance issues prior to CVA due to back issues   A  FUNCTIONAL OUTCOME MEASURES: Trailmaking A 1 error, Trailmaking B multiple errors  UPPER EXTREMITY ROM:  WFLS  (Blank rows =  not tested)  UPPER EXTREMITY MMT:   bilateral UE's 4/5-5/5  (Blank rows = not tested)  HAND FUNCTION: Grip strength: Right: 65 lbs lbs; Left: 68 lbs  COORDINATION: 9 Hole Peg test: Right: 30.63 sec; Left: 33.56 sec with multiple drops  SENSATION: Hot/Cold: Impaired  LUE   COGNITION: Overall cognitive status: Impaired, decreased attention, 1 error on Trailmkaing A,  multiple errors Trailmaking B- indicating decreased alternating attention. Pt was oriented to day, date and location  VISION: Subjective report: diplopia   VISION ASSESSMENT: Impaired Ocular ROM: WFL Tracking/Visual pursuits: Decreased smoothness with horizontal tracking Visual Fields: no apparent deficits Diplopia assessment: present in Right gaze      OBSERVATIONS: Pleasant gentleman who would like to improve his function.                                                                                                                             TREATMENT DATE: 12/13/23 eval, pt was shown horizontal tracking exercise with both eyes within range that pt does not have diplopia.        PATIENT EDUCATION: Education details: role of OT potential goals, initial HEP Person educated: Patient Education method: Explanation, Demonstration, Verbal cues, and Handouts Education comprehension: verbalized understanding, returned demonstration, and verbal cues required  HOME EXERCISE PROGRAM: eye HEP with card 12/13/23   GOALS: Goals reviewed with patient? Yes  SHORT TERM GOALS: Target date: 01/13/24  I with HEP for (vision, coordination) Baseline:dependent Goal status: INITIAL  2.  Pt will vebalize understanding of compensatory strategies for visual deficits. Baseline: dependent Goal status: INITIAL  3.  Pt will perform a functional alternating attention task with 80% or better accuracy Baseline: unable to correctly complete trail making B Goal status: INITIAL  4.  Pt will verbalize understanding of sensory precautions for LUE Baseline: needs reinforcement Goal status: INITIAL    LONG TERM GOALS: Target date: 02/28/24  Pt will perform environmental scanning in a busy environment with 90% or better accuracy and minimal reports of diplopia. Baseline: diplopia in right visual field with scanning Goal status: INITIAL  2.   Pt will perform a functional alternating  attention task with 90% or better accuracy Baseline: unable to complete trail making B correctly Goal status: INITIAL  3.  Pt will perform a physical and cognitve task simultaneously with 90% or better accuracy in prep for driving. Baseline: difficulty with trailmaking B Goal status: INITIAL    ASSESSMENT:  CLINICAL IMPRESSION:52 y.o. male hospitalized 7/21 with difficulty swallowing, diplopia, LUE numbness for 4 days. MRI brain with small acute infarct within the R pons in the region of the paramedian pontine reticular formation. PMH significant of DM2, CAD, HTN, spinal stenosis, asthma Pt presents with the performance deficits below. He can benefit from skilled occupational therapy to address these deficits in order to maximize pt's safety and I with ADLs/IADLs.    PERFORMANCE DEFICITS: in functional skills including ADLs, IADLs, coordination, dexterity, sensation, flexibility, Fine motor  control, mobility, balance, endurance, decreased knowledge of precautions, decreased knowledge of use of DME, vision, and UE functional use, cognitive skills including attention, learn, problem solving, safety awareness, sequencing, temperament/personality, thought, and understand, and psychosocial skills including coping strategies, environmental adaptation, habits, interpersonal interactions, and routines and behaviors.   IMPAIRMENTS: are limiting patient from ADLs, IADLs, rest and sleep, work, play, leisure, and social participation.   CO-MORBIDITIES: may have co-morbidities  that affects occupational performance. Patient will benefit from skilled OT to address above impairments and improve overall function.  MODIFICATION OR ASSISTANCE TO COMPLETE EVALUATION: No modification of tasks or assist necessary to complete an evaluation.  OT OCCUPATIONAL PROFILE AND HISTORY: Detailed assessment: Review of records and additional review of physical, cognitive, psychosocial history related to current functional  performance.  CLINICAL DECISION MAKING: Moderate - several treatment options, min-mod task modification necessary  REHAB POTENTIAL: Good  EVALUATION COMPLEXITY: Low    PLAN:  OT FREQUENCY: 1x/week plus eval  OT DURATION: 10 weeks  PLANNED INTERVENTIONS: 97168 OT Re-evaluation, 97535 self care/ADL training, 02889 therapeutic exercise, 97530 therapeutic activity, 97112 neuromuscular re-education, 97140 manual therapy, 97035 ultrasound, 97018 paraffin, 02989 moist heat, 97010 cryotherapy, 97129 Cognitive training (first 15 min), 02869 Cognitive training(each additional 15 min), passive range of motion, balance training, visual/perceptual remediation/compensation, psychosocial skills training, energy conservation, coping strategies training, patient/family education, and DME and/or AE instructions  RECOMMENDED OTHER SERVICES: ST, PT  CONSULTED AND AGREED WITH PLAN OF CARE: Patient  PLAN FOR NEXT SESSION: review/ add to vision HEP, vison compensations, environmental scanning   Esmond Hinch, OT 12/13/2023, 11:16 AM

## 2023-12-14 ENCOUNTER — Encounter: Payer: Self-pay | Admitting: Neurology

## 2023-12-14 ENCOUNTER — Other Ambulatory Visit (HOSPITAL_COMMUNITY): Payer: Self-pay

## 2023-12-14 ENCOUNTER — Inpatient Hospital Stay: Payer: Self-pay | Admitting: Neurology

## 2023-12-14 DIAGNOSIS — H532 Diplopia: Secondary | ICD-10-CM | POA: Diagnosis not present

## 2023-12-14 NOTE — Progress Notes (Deleted)
 Patient: Peter Bell Date of Birth: 20-Apr-1971  Reason for Visit: Follow up History from: Patient Primary Neurologist: Rosemarie   ASSESSMENT AND PLAN 53 y.o. 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  Presented with slurred speech, diplopia and dysphagia for 4 days.  Vascular risk factors: DM, CAD, HTN, HLD   HISTORY OF PRESENT ILLNESS: Today 12/14/23  HISTORY  Admitted in July 2025 for slurred speech, diplopia and dysphagia for 4 days.  Had a recent CAD status post stenting on DAPT.  Right paramedian inferior pontine infarct likely secondary to small vessel disease. Outside TPA window.   - CT head old left pontine infarct - CTA head and neck left P1 occlusion with distal reconstitution, mild stenosis of proximal basilar artery.  Moderate stenosis of right anterior cerebral artery A2 segment. - MRI of the brain right paramedian pontine small infarcts - 2D echo EF 55 to 60% - LDL 60 - A1c 8.8 - P2Y12=121 - UDS negative - Aspirin  81 mg daily and Plavix  75 mg daily prior to admission, continue on discharge   REVIEW OF SYSTEMS: Out of a complete 14 system review of symptoms, the patient complains only of the following symptoms, and all other reviewed systems are negative.  See HPI  ALLERGIES: No Known Allergies  HOME MEDICATIONS: Outpatient Medications Prior to Visit  Medication Sig Dispense Refill   amLODipine  (NORVASC ) 10 MG tablet Take 1 tablet (10 mg total) by mouth daily. 90 tablet 3   aspirin  EC 81 MG tablet Take 1 tablet (81 mg total) by mouth daily. Swallow whole. 90 tablet 3   atorvastatin  (LIPITOR) 80 MG tablet Take 1 tablet (80 mg total) by mouth daily. 90 tablet 3   clopidogrel  (PLAVIX ) 75 MG tablet Take 1 tablet (75 mg total) by mouth daily. 90 tablet 3   empagliflozin  (JARDIANCE ) 10 MG TABS tablet Take 1 tablet (10 mg total) by mouth daily. 30 tablet 11   losartan  (COZAAR ) 100 MG tablet Take 1  tablet (100 mg total) by mouth daily. 90 tablet 3   metFORMIN  (GLUCOPHAGE ) 500 MG tablet Take 2 tablets (1,000 mg total) by mouth daily. Take with meal. 60 tablet 0   metoprolol  succinate (TOPROL  XL) 25 MG 24 hr tablet Take 1 tablet (25 mg total) by mouth daily. 90 tablet 3   nitroGLYCERIN  (NITROSTAT ) 0.4 MG SL tablet Place 1 tablet (0.4 mg total) under the tongue every 5 (five) minutes x 3 doses as needed for chest pain. 25 tablet 0   pantoprazole  (PROTONIX ) 20 MG tablet Take 1 tablet (20 mg total) by mouth daily. 30 tablet 0   Semaglutide  (RYBELSUS ) 3 MG TABS Take 1 tablet (3 mg total) by mouth in morning on an empty stomach with up to 4 ounces of plain water only 30 minutes prior to eating. 30 tablet 0   No facility-administered medications prior to visit.    PAST MEDICAL HISTORY: Past Medical History:  Diagnosis Date   Asthma    Diabetes mellitus without complication (HCC)    Eczema    GERD (gastroesophageal reflux disease)    Hypertension    Myocardial infarction [facility])    Sciatica     PAST SURGICAL HISTORY: Past Surgical History:  Procedure Laterality Date   CORONARY STENT INTERVENTION N/A 09/26/2023   Procedure: CORONARY STENT INTERVENTION;  Surgeon: Swaziland, [name], MD;  Location: [facility] INVASIVE CV LAB;  Service: Cardiovascular;  Laterality: N/A;   HERNIA REPAIR  LEFT HEART CATH AND CORONARY ANGIOGRAPHY N/A 09/26/2023   Procedure: LEFT HEART CATH AND CORONARY ANGIOGRAPHY;  Surgeon: Swaziland, [name], MD;  Location: [facility] INVASIVE CV LAB;  Service: Cardiovascular;  Laterality: N/A;    FAMILY HISTORY: Family History  Problem Relation Age of Onset   Cancer Mother    Cancer Father    Hypertension Sister    Heart murmur Sister    CVA Brother    CVA Brother    Hypertension Brother     SOCIAL HISTORY: Social History   Socioeconomic History   Marital status: Divorced    Spouse name: Not on file   Number of children: Not on file   Years of education: Not on file   Highest  education level: Not on file  Occupational History   Not on file  Tobacco Use   Smoking status: Never    Passive exposure: Never   Smokeless tobacco: Never  Vaping Use   Vaping status: Never Used  Substance and Sexual Activity   Alcohol use: Not Currently   Drug use: No   Sexual activity: Not Currently  Other Topics Concern   Not on file  Social History Narrative   Not on file   Social Drivers of Health   Financial Resource Strain: Medium Risk (10/24/2023)   Overall Financial Resource Strain (CARDIA)    Difficulty of Paying Living Expenses: Somewhat hard  Food Insecurity: Food Insecurity Present (11/16/2023)   Hunger Vital Sign    Worried About Running Out of Food in the Last Year: Often true    Ran Out of Food in the Last Year: Sometimes true  Transportation Needs: No Transportation Needs (10/24/2023)   PRAPARE - Administrator, Civil Service (Medical): No    Lack of Transportation (Non-Medical): No  Physical Activity: Inactive (10/24/2023)   Exercise Vital Sign    Days of Exercise per Week: 2 days    Minutes of Exercise per Session: 0 min  Stress: No Stress Concern Present (10/24/2023)   [employer] of Occupational Health - Occupational Stress Questionnaire    Feeling of Stress: Only a little  Social Connections: Unknown (10/24/2023)   Social Connection and Isolation Panel    Frequency of Communication with Friends and Family: Not on file    Frequency of Social Gatherings with Friends and Family: Not on file    Attends Religious Services: Not on file    Active Member of Clubs or Organizations: No    Attends Banker Meetings: Not on file    Marital Status: Not on file  Intimate Partner Violence: Not At Risk (11/16/2023)   Humiliation, Afraid, Rape, and Kick questionnaire    Fear of Current or Ex-Partner: No    Emotionally Abused: No    Physically Abused: No    Sexually Abused: No    PHYSICAL EXAM  There were no vitals filed for this  visit. There is no height or weight on file to calculate BMI.  Generalized: Well developed, in no acute distress  Neurological examination  Mentation: Alert oriented to time, place, history taking. Follows all commands speech and language fluent Cranial nerve II-XII: Pupils were equal round reactive to light. Extraocular movements were full, visual field were full on confrontational test. Facial sensation and strength were normal. Uvula tongue midline. Head turning and shoulder shrug  were normal and symmetric. Motor: The motor testing reveals 5 over 5 strength of all 4 extremities. Good symmetric motor tone is noted throughout.  Sensory:  Sensory testing is intact to soft touch on all 4 extremities. No evidence of extinction is noted.  Coordination: Cerebellar testing reveals good finger-nose-finger and heel-to-shin bilaterally.  Gait and station: Gait is normal. Tandem gait is normal. Romberg is negative. No drift is seen.  Reflexes: Deep tendon reflexes are symmetric and normal bilaterally.   DIAGNOSTIC DATA (LABS, IMAGING, TESTING) - I reviewed patient records, labs, notes, testing and imaging myself where available.  Lab Results  Component Value Date   WBC 11.3 (H) 11/16/2023   HGB 13.0 11/16/2023   HCT 39.3 11/16/2023   MCV 92.7 11/16/2023   PLT 307 11/16/2023      Component Value Date/Time   NA 138 11/16/2023 0322   K 4.1 11/16/2023 0322   CL 104 11/16/2023 0322   CO2 24 11/16/2023 0322   GLUCOSE 130 (H) 11/16/2023 0322   BUN 12 11/16/2023 0322   CREATININE 0.93 11/16/2023 0322   CALCIUM  8.6 (L) 11/16/2023 0322   PROT 7.4 11/14/2023 1215   ALBUMIN 3.6 11/14/2023 1215   AST 21 11/14/2023 1215   ALT 14 11/14/2023 1215   ALKPHOS 103 11/14/2023 1215   BILITOT 0.9 11/14/2023 1215   GFRNONAA >60 11/16/2023 0322   GFRAA >60 08/15/2017 2115   Lab Results  Component Value Date   CHOL 96 11/15/2023   HDL 28 (L) 11/15/2023   LDLCALC 60 11/15/2023   TRIG 42 11/15/2023    CHOLHDL 3.4 11/15/2023   Lab Results  Component Value Date   HGBA1C 8.8 (H) 09/26/2023   No results found for: [id number] Lab Results  Component Value Date   TSH 0.812 ***Test methodology is 3rd generation TSH*** 12/24/2006    Lauraine Born, AGNP-C, DNP 12/14/2023, 5:23 AM [facility] [address], [address] [place], [place] [address] [phone]

## 2023-12-16 ENCOUNTER — Telehealth (HOSPITAL_BASED_OUTPATIENT_CLINIC_OR_DEPARTMENT_OTHER): Payer: Self-pay | Admitting: *Deleted

## 2023-12-16 ENCOUNTER — Other Ambulatory Visit (HOSPITAL_COMMUNITY): Payer: Self-pay

## 2023-12-16 NOTE — Telephone Encounter (Signed)
**Note De-identified  [place] Obfuscation** Please advise 

## 2023-12-16 NOTE — Telephone Encounter (Signed)
 Copied from CRM #[id number]. Topic: Appointments - Scheduling Inquiry for Clinic >> Dec 16, 2023 11:33 AM Emylou G wrote: Reason for CRM: Patient would like alternate location for his PT that is closer?  He said where he is going is too far away.  He heard there is one in [place]?

## 2023-12-16 NOTE — Telephone Encounter (Signed)
 Copied from CRM [id number]. Topic: Clinical - Prescription Issue >> Dec 16, 2023 11:31 AM Emylou G wrote: Reason for CRM: Please call patient.. he has been waiting for his alternative for metFORMIN  (GLUCOPHAGE ) 500 MG tablet -- he is almost out.. he said they dr was trying to prescribe him with something else?

## 2023-12-19 ENCOUNTER — Other Ambulatory Visit (HOSPITAL_BASED_OUTPATIENT_CLINIC_OR_DEPARTMENT_OTHER): Payer: Self-pay | Admitting: *Deleted

## 2023-12-19 ENCOUNTER — Telehealth (HOSPITAL_BASED_OUTPATIENT_CLINIC_OR_DEPARTMENT_OTHER): Payer: Self-pay | Admitting: *Deleted

## 2023-12-19 ENCOUNTER — Ambulatory Visit (HOSPITAL_COMMUNITY)
Admission: EM | Admit: 2023-12-19 | Discharge: 2023-12-19 | Disposition: A | Discharge status: Home / Self Care | Attending: Emergency Medicine | Admitting: Emergency Medicine

## 2023-12-19 ENCOUNTER — Encounter (HOSPITAL_COMMUNITY): Payer: Self-pay

## 2023-12-19 ENCOUNTER — Other Ambulatory Visit (HOSPITAL_BASED_OUTPATIENT_CLINIC_OR_DEPARTMENT_OTHER): Payer: Self-pay | Admitting: Family Medicine

## 2023-12-19 ENCOUNTER — Other Ambulatory Visit (HOSPITAL_COMMUNITY): Payer: Self-pay

## 2023-12-19 ENCOUNTER — Encounter (HOSPITAL_COMMUNITY): Payer: Self-pay | Admitting: Emergency Medicine

## 2023-12-19 DIAGNOSIS — S61209A Unspecified open wound of unspecified finger without damage to nail, initial encounter: Secondary | ICD-10-CM

## 2023-12-19 MED ORDER — RYBELSUS 3 MG PO TABS
3.0000 mg | ORAL_TABLET | Freq: Every day | ORAL | 0 refills | Status: DC
  Filled 2023-12-19: qty 30, 30d supply, fill #0

## 2023-12-19 MED ORDER — METFORMIN HCL 500 MG PO TABS
500.0000 mg | ORAL_TABLET | Freq: Every day | ORAL | 0 refills | Status: DC
  Filled 2023-12-19: qty 90, 90d supply, fill #0

## 2023-12-19 NOTE — ED Provider Notes (Signed)
 [facility]    CSN: [id number] Arrival date & time: 12/19/23  1716      History   Chief Complaint Chief Complaint  Patient presents with   Finger Injury    HPI Peter Bell is a 53 y.o. male.   Patient presents with cut to his left middle finger that occurred about 3 or 4 days ago.  Patient states he is unsure exactly how he cut his finger, but states that he does check his blood sugar regularly on different fingers and wonders if this could be related.  Patient states that the area occasionally will be open and bleed and he has trouble controlling the bleeding.  Patient is currently on Plavix .  Patient is concerned about infection.  Patient denies any purulent drainage, swelling, pain, or redness to the area.  Patient also denies fever, body aches, chills, and weakness.  The history is provided by the patient and medical records.    Past Medical History:  Diagnosis Date   Asthma    Diabetes mellitus without complication (HCC)    Eczema    GERD (gastroesophageal reflux disease)    Hypertension    Myocardial infarction [facility])    Sciatica     Patient Active Problem List   Diagnosis Date Noted   CAD S/P percutaneous coronary angioplasty 11/15/2023   Stroke (HCC) 11/14/2023   Chronic low back pain 10/26/2023   NSTEMI (non-ST elevated myocardial infarction) (HCC) 09/25/2023   Impaired gait 03/31/2023   Type 2 diabetes mellitus with hyperglycemia, without long-term current use of insulin  (HCC) 03/31/2023   Neuropathic pain 03/31/2023   Spinal stenosis of lumbar region 04/22/2020   Acute pain of left knee 09/17/2019   Protrusion of lumbar intervertebral disc 05/29/2019   SOB (shortness of breath) 05/10/2013   Essential hypertension, benign 04/24/2013   Chest pain, unspecified 04/24/2013    Past Surgical History:  Procedure Laterality Date   CORONARY STENT INTERVENTION N/A 09/26/2023   Procedure: CORONARY STENT INTERVENTION;  Surgeon: Swaziland, [name], MD;   Location: [facility] INVASIVE CV LAB;  Service: Cardiovascular;  Laterality: N/A;   HERNIA REPAIR     LEFT HEART CATH AND CORONARY ANGIOGRAPHY N/A 09/26/2023   Procedure: LEFT HEART CATH AND CORONARY ANGIOGRAPHY;  Surgeon: Swaziland, [name], MD;  Location: [facility] INVASIVE CV LAB;  Service: Cardiovascular;  Laterality: N/A;       Home Medications    Prior to Admission medications   Medication Sig Start Date End Date Taking? Authorizing Provider  amLODipine  (NORVASC ) 10 MG tablet Take 1 tablet (10 mg total) by mouth daily. 11/21/23   Caudle, Thersia Bitters, FNP  aspirin  EC 81 MG tablet Take 1 tablet (81 mg total) by mouth daily. Swallow whole. 09/28/23   Adams, Zane, PA-C  atorvastatin  (LIPITOR) 80 MG tablet Take 1 tablet (80 mg total) by mouth daily. 11/21/23   Caudle, Thersia Bitters, FNP  clopidogrel  (PLAVIX ) 75 MG tablet Take 1 tablet (75 mg total) by mouth daily. 10/21/23   Duke, Jon Garre, PA  empagliflozin  (JARDIANCE ) 10 MG TABS tablet Take 1 tablet (10 mg total) by mouth daily. 09/27/23   Adams, Zane, PA-C  losartan  (COZAAR ) 100 MG tablet Take 1 tablet (100 mg total) by mouth daily. 11/21/23 02/19/24  CaudleThersia Bitters, FNP  metFORMIN  (GLUCOPHAGE ) 500 MG tablet Take 1 tablet (500 mg total) by mouth daily. Take with meal. 12/19/23   de Peru, Quintin PARAS, MD  metoprolol  succinate (TOPROL  XL) 25 MG 24 hr tablet Take  1 tablet (25 mg total) by mouth daily. 10/21/23   Duke, Jon Garre, PA  nitroGLYCERIN  (NITROSTAT ) 0.4 MG SL tablet Place 1 tablet (0.4 mg total) under the tongue every 5 (five) minutes x 3 doses as needed for chest pain. 09/27/23   Adams, Zane, PA-C  pantoprazole  (PROTONIX ) 20 MG tablet Take 1 tablet (20 mg total) by mouth daily. 11/11/23   Vonna Sharlet POUR, MD  insulin  glargine (LANTUS) 100 UNIT/ML injection Inject 16 Units into the skin daily.  04/14/19  [provider]  pravastatin (PRAVACHOL) 40 MG tablet Take 40 mg by mouth daily.  04/14/19  [provider]    Family  History Family History  Problem Relation Age of Onset   Cancer Mother    Cancer Father    Hypertension Sister    Heart murmur Sister    CVA Brother    CVA Brother    Hypertension Brother     Social History Social History   Tobacco Use   Smoking status: Never    Passive exposure: Never   Smokeless tobacco: Never  Vaping Use   Vaping status: Never Used  Substance Use Topics   Alcohol use: Not Currently   Drug use: No     Allergies   Patient has no known allergies.   Review of Systems Review of Systems  Per HPI  Physical Exam Triage Vital Signs ED Triage Vitals [12/19/23 1820]  Encounter Vitals Group     BP (!) 146/87     Girls Systolic BP Percentile      Girls Diastolic BP Percentile      Boys Systolic BP Percentile      Boys Diastolic BP Percentile      Pulse Rate 68     Resp 16     Temp 98.9 F (37.2 C)     Temp Source Oral     SpO2 98 %     Weight      Height      Head Circumference      Peak Flow      Pain Score 0     Pain Loc      Pain Education      Exclude from Growth Chart    No data found.  Updated Vital Signs BP (!) 146/87 (BP Location: Right Arm)   Pulse 68   Temp 98.9 F (37.2 C) (Oral)   Resp 16   SpO2 98%   Visual Acuity Right Eye Distance:   Left Eye Distance:   Bilateral Distance:    Right Eye Near:   Left Eye Near:    Bilateral Near:     Physical Exam Vitals and nursing note reviewed.  Constitutional:      General: He is awake. He is not in acute distress.    Appearance: Normal appearance. He is well-developed and well-groomed. He is not ill-appearing.  Skin:    General: Skin is warm and dry.     Findings: Wound present.     Comments: Pinpoint sized puncture like wound noted to the finger pad of the left middle finger.  Minimal blood persistent bleeding noted at this time.  Neurological:     Mental Status: He is alert.  Psychiatric:        Behavior: Behavior is cooperative.      UC Treatments / Results   Labs (all labs ordered are listed, but only abnormal results are displayed) Labs Reviewed - No data to display  EKG   Radiology  No results found.  Procedures Procedures (including critical care time)  Medications Ordered in UC Medications - No data to display  Initial Impression / Assessment and Plan / UC Course  I have reviewed the triage vital signs and the nursing notes.  Pertinent labs & imaging results that were available during my care of the patient were reviewed by me and considered in my medical decision making (see chart for details).     Patient is overall well-appearing.  Vitals are stable.  Bleeding relieved after continued pressure.  Pressure dressing applied.  Prescribing Pearson for infection prevention.  Discussed follow-up and return precautions. Final Clinical Impressions(s) / UC Diagnoses   Final diagnoses:  Finger wound, simple, open, initial encounter     Discharge Instructions      Keep a dressing on the area to help prevent it from bleeding.  Do not pick at or mess with the area which may cause it to reopen. Keep the area clean dry and covered.  You can apply mupirocin ointment twice daily for infection prevention. If you notice swelling to your finger or puslike drainage from the area return here for reevaluation. Otherwise follow-up with your primary care provider or return here as needed.   ED Prescriptions   None    PDMP not reviewed this encounter.   Johnie Flaming A, NP 12/19/23 [phone]

## 2023-12-19 NOTE — Discharge Instructions (Signed)
 Keep a dressing on the area to help prevent it from bleeding.  Do not pick at or mess with the area which may cause it to reopen. Keep the area clean dry and covered.  You can apply mupirocin ointment twice daily for infection prevention. If you notice swelling to your finger or puslike drainage from the area return here for reevaluation. Otherwise follow-up with your primary care provider or return here as needed.

## 2023-12-19 NOTE — Telephone Encounter (Signed)
 Patient needed refill on rybelsus . Medication refill sent to pharmacy.

## 2023-12-19 NOTE — Telephone Encounter (Signed)
 Copied from CRM #[id number]. Topic: Clinical - Prescription Issue >> Dec 19, 2023  9:57 AM Avram MATSU wrote: Reason for CRM: pt would like to know if his medication for Semaglutide  (RYBELSUS ) 3 MG TABS [[id number]] was approved. If not he's is requesting his metFORMIN  (GLUCOPHAGE ) 500 MG tablet [[id number]] to be refilled. I saw the message about the medication being sent out on was sent out on 7/28. It seems the pt has not receive the medication at all and would like to know what to do.   [phone] please callback to advise.

## 2023-12-19 NOTE — Telephone Encounter (Signed)
 Had refilled 3 mg rybelsus  patient stated he was able to get it and was without it for the last two days. Called [facility] to let them know to cancel refill for 3mg .  Please send in 7mg  for patient.

## 2023-12-19 NOTE — Telephone Encounter (Signed)
 Patient advised with verbal understanding will keep appt with Peter Bell

## 2023-12-19 NOTE — Addendum Note (Signed)
 Addended by: DE PERU, Tilak Oakley J on: 12/19/2023 03:44 PM   Modules accepted: Orders

## 2023-12-19 NOTE — Telephone Encounter (Signed)
 Called patient back after speaking with Dr everitt Peru since provider Thersia is out of the office. Patient was not able to start Rybelsus . He received a denial letter from insurance. Patient has continued to take the 500 mg metformin  that he was on but he is out of this medication now. Stated he has not seen provider since July. Please advise as patient has been out of metformin  for 2 days.

## 2023-12-19 NOTE — Progress Notes (Signed)
 Called [facility] pharmacy to cancel 3 mg rybelsus  as provider is sending in 7 mg.

## 2023-12-19 NOTE — ED Triage Notes (Signed)
 Pt reports cut left middle finger 3-4 days ago but doesn't know how. Reports that is on blood thinners and is a diabetic. Pt concerned about infection.

## 2023-12-20 ENCOUNTER — Other Ambulatory Visit (HOSPITAL_COMMUNITY): Payer: Self-pay

## 2023-12-21 ENCOUNTER — Encounter: Payer: Self-pay | Admitting: Neurology

## 2023-12-21 ENCOUNTER — Inpatient Hospital Stay: Admitting: Neurology

## 2023-12-21 NOTE — Progress Notes (Deleted)
 Patient: CAMRY THEISS Date of Birth: 12-23-1970  Reason for Visit: Follow up History from: Patient Primary Neurologist: Rosemarie   ASSESSMENT AND PLAN 53 y.o. 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  Presented with slurred speech, diplopia and dysphagia for 4 days.  Vascular risk factors: DM, CAD, HTN, HLD   HISTORY OF PRESENT ILLNESS: Today 12/21/23  HISTORY  Admitted in July 2025 for slurred speech, diplopia and dysphagia for 4 days.  Had a recent CAD status post stenting on DAPT.  Right paramedian inferior pontine infarct likely secondary to small vessel disease. Outside TPA window.   - CT head old left pontine infarct - CTA head and neck left P1 occlusion with distal reconstitution, mild stenosis of proximal basilar artery.  Moderate stenosis of right anterior cerebral artery A2 segment. - MRI of the brain right paramedian pontine small infarcts - 2D echo EF 55 to 60% - LDL 60 - A1c 8.8 - P2Y12=121 - UDS negative - Aspirin  81 mg daily and Plavix  75 mg daily prior to admission, continue on discharge   REVIEW OF SYSTEMS: Out of a complete 14 system review of symptoms, the patient complains only of the following symptoms, and all other reviewed systems are negative.  See HPI  ALLERGIES: No Known Allergies  HOME MEDICATIONS: Outpatient Medications Prior to Visit  Medication Sig Dispense Refill   amLODipine  (NORVASC ) 10 MG tablet Take 1 tablet (10 mg total) by mouth daily. 90 tablet 3   aspirin  EC 81 MG tablet Take 1 tablet (81 mg total) by mouth daily. Swallow whole. 90 tablet 3   atorvastatin  (LIPITOR) 80 MG tablet Take 1 tablet (80 mg total) by mouth daily. 90 tablet 3   clopidogrel  (PLAVIX ) 75 MG tablet Take 1 tablet (75 mg total) by mouth daily. 90 tablet 3   empagliflozin  (JARDIANCE ) 10 MG TABS tablet Take 1 tablet (10 mg total) by mouth daily. 30 tablet 11   losartan  (COZAAR ) 100 MG tablet Take 1  tablet (100 mg total) by mouth daily. 90 tablet 3   metFORMIN  (GLUCOPHAGE ) 500 MG tablet Take 1 tablet (500 mg total) by mouth daily. Take with meal. 90 tablet 0   metoprolol  succinate (TOPROL  XL) 25 MG 24 hr tablet Take 1 tablet (25 mg total) by mouth daily. 90 tablet 3   nitroGLYCERIN  (NITROSTAT ) 0.4 MG SL tablet Place 1 tablet (0.4 mg total) under the tongue every 5 (five) minutes x 3 doses as needed for chest pain. 25 tablet 0   pantoprazole  (PROTONIX ) 20 MG tablet Take 1 tablet (20 mg total) by mouth daily. 30 tablet 0   No facility-administered medications prior to visit.    PAST MEDICAL HISTORY: Past Medical History:  Diagnosis Date   Asthma    Diabetes mellitus without complication (HCC)    Eczema    GERD (gastroesophageal reflux disease)    Hypertension    Myocardial infarction [facility])    Sciatica     PAST SURGICAL HISTORY: Past Surgical History:  Procedure Laterality Date   CORONARY STENT INTERVENTION N/A 09/26/2023   Procedure: CORONARY STENT INTERVENTION;  Surgeon: Swaziland, [name], MD;  Location: [facility] INVASIVE CV LAB;  Service: Cardiovascular;  Laterality: N/A;   HERNIA REPAIR     LEFT HEART CATH AND CORONARY ANGIOGRAPHY N/A 09/26/2023   Procedure: LEFT HEART CATH AND CORONARY ANGIOGRAPHY;  Surgeon: Swaziland, [name], MD;  Location: [facility] INVASIVE CV LAB;  Service: Cardiovascular;  Laterality: N/A;  FAMILY HISTORY: Family History  Problem Relation Age of Onset   Cancer Mother    Cancer Father    Hypertension Sister    Heart murmur Sister    CVA Brother    CVA Brother    Hypertension Brother     SOCIAL HISTORY: Social History   Socioeconomic History   Marital status: Divorced    Spouse name: Not on file   Number of children: Not on file   Years of education: Not on file   Highest education level: Not on file  Occupational History   Not on file  Tobacco Use   Smoking status: Never    Passive exposure: Never   Smokeless tobacco: Never  Vaping Use   Vaping  status: Never Used  Substance and Sexual Activity   Alcohol use: Not Currently   Drug use: No   Sexual activity: Not Currently  Other Topics Concern   Not on file  Social History Narrative   Not on file   Social Drivers of Health   Financial Resource Strain: Medium Risk (10/24/2023)   Overall Financial Resource Strain (CARDIA)    Difficulty of Paying Living Expenses: Somewhat hard  Food Insecurity: Food Insecurity Present (11/16/2023)   Hunger Vital Sign    Worried About Running Out of Food in the Last Year: Often true    Ran Out of Food in the Last Year: Sometimes true  Transportation Needs: No Transportation Needs (10/24/2023)   PRAPARE - Administrator, Civil Service (Medical): No    Lack of Transportation (Non-Medical): No  Physical Activity: Inactive (10/24/2023)   Exercise Vital Sign    Days of Exercise per Week: 2 days    Minutes of Exercise per Session: 0 min  Stress: No Stress Concern Present (10/24/2023)   [employer] of Occupational Health - Occupational Stress Questionnaire    Feeling of Stress: Only a little  Social Connections: Unknown (10/24/2023)   Social Connection and Isolation Panel    Frequency of Communication with Friends and Family: Not on file    Frequency of Social Gatherings with Friends and Family: Not on file    Attends Religious Services: Not on file    Active Member of Clubs or Organizations: No    Attends Banker Meetings: Not on file    Marital Status: Not on file  Intimate Partner Violence: Not At Risk (11/16/2023)   Humiliation, Afraid, Rape, and Kick questionnaire    Fear of Current or Ex-Partner: No    Emotionally Abused: No    Physically Abused: No    Sexually Abused: No    PHYSICAL EXAM  There were no vitals filed for this visit. There is no height or weight on file to calculate BMI.  Generalized: Well developed, in no acute distress  Neurological examination  Mentation: Alert oriented to time, place,  history taking. Follows all commands speech and language fluent Cranial nerve II-XII: Pupils were equal round reactive to light. Extraocular movements were full, visual field were full on confrontational test. Facial sensation and strength were normal. Uvula tongue midline. Head turning and shoulder shrug  were normal and symmetric. Motor: The motor testing reveals 5 over 5 strength of all 4 extremities. Good symmetric motor tone is noted throughout.  Sensory: Sensory testing is intact to soft touch on all 4 extremities. No evidence of extinction is noted.  Coordination: Cerebellar testing reveals good finger-nose-finger and heel-to-shin bilaterally.  Gait and station: Gait is normal. Tandem gait is normal.  Romberg is negative. No drift is seen.  Reflexes: Deep tendon reflexes are symmetric and normal bilaterally.   DIAGNOSTIC DATA (LABS, IMAGING, TESTING) - I reviewed patient records, labs, notes, testing and imaging myself where available.  Lab Results  Component Value Date   WBC 11.3 (H) 11/16/2023   HGB 13.0 11/16/2023   HCT 39.3 11/16/2023   MCV 92.7 11/16/2023   PLT 307 11/16/2023      Component Value Date/Time   NA 138 11/16/2023 0322   K 4.1 11/16/2023 0322   CL 104 11/16/2023 0322   CO2 24 11/16/2023 0322   GLUCOSE 130 (H) 11/16/2023 0322   BUN 12 11/16/2023 0322   CREATININE 0.93 11/16/2023 0322   CALCIUM  8.6 (L) 11/16/2023 0322   PROT 7.4 11/14/2023 1215   ALBUMIN 3.6 11/14/2023 1215   AST 21 11/14/2023 1215   ALT 14 11/14/2023 1215   ALKPHOS 103 11/14/2023 1215   BILITOT 0.9 11/14/2023 1215   GFRNONAA >60 11/16/2023 0322   GFRAA >60 08/15/2017 2115   Lab Results  Component Value Date   CHOL 96 11/15/2023   HDL 28 (L) 11/15/2023   LDLCALC 60 11/15/2023   TRIG 42 11/15/2023   CHOLHDL 3.4 11/15/2023   Lab Results  Component Value Date   HGBA1C 8.8 (H) 09/26/2023   No results found for: [id number] Lab Results  Component Value Date   TSH 0.812 ***Test  methodology is 3rd generation TSH*** 12/24/2006    Lauraine Born, AGNP-C, DNP 12/21/2023, 5:24 AM [facility] [address] [place], [place] [address] [phone]

## 2023-12-22 ENCOUNTER — Other Ambulatory Visit (HOSPITAL_COMMUNITY): Payer: Self-pay

## 2023-12-22 DIAGNOSIS — H5213 Myopia, bilateral: Secondary | ICD-10-CM | POA: Diagnosis not present

## 2023-12-27 ENCOUNTER — Telehealth: Payer: Self-pay | Admitting: Neurology

## 2023-12-27 NOTE — Telephone Encounter (Signed)
 Patient called to reschedule appointment

## 2023-12-28 ENCOUNTER — Encounter (HOSPITAL_BASED_OUTPATIENT_CLINIC_OR_DEPARTMENT_OTHER): Payer: Self-pay | Admitting: Family Medicine

## 2023-12-28 ENCOUNTER — Ambulatory Visit (HOSPITAL_BASED_OUTPATIENT_CLINIC_OR_DEPARTMENT_OTHER): Admitting: Family Medicine

## 2023-12-28 VITALS — BP 134/78 | HR 67 | Ht 66.0 in | Wt 133.6 lb

## 2023-12-28 DIAGNOSIS — M545 Low back pain, unspecified: Secondary | ICD-10-CM

## 2023-12-28 DIAGNOSIS — G8929 Other chronic pain: Secondary | ICD-10-CM

## 2023-12-28 DIAGNOSIS — I10 Essential (primary) hypertension: Secondary | ICD-10-CM

## 2023-12-28 DIAGNOSIS — E1165 Type 2 diabetes mellitus with hyperglycemia: Secondary | ICD-10-CM | POA: Diagnosis not present

## 2023-12-28 NOTE — Progress Notes (Signed)
 Subjective:   Peter Bell 22-Sep-1970 12/28/2023  Chief Complaint  Patient presents with   Medical Management of Chronic Issues    3-week follow up; pt states that the acid reflux medication he was prescribed is not working and states he is having problems at night. Also wants to discuss getting a handicap placard. Also wants a referral due to his back pain.     HPI: Peter Bell presents today for re-assessment and management of chronic medical conditions. He is requesting handicap placard today.   HYPERTENSION: Peter Bell presents for the medical management of hypertension.  Patient's current hypertension medication regimen is: Amlodipine  10mg , Losartan  100mg , Metoprolol  25mg  XR Patient is  currently taking prescribed medications for HTN.  Patient is  regularly keeping a check on BP at home.  Adhering to low sodium diet: Yes Exercising Regularly: No, due to chronic back pain.  Denies headache, dizziness, CP, SHOB, vision changes.   BP Readings from Last 3 Encounters:  12/28/23 134/78  12/19/23 (!) 146/87  11/21/23 (!) 133/94    DIABETES MELLITUS: Peter Bell presents for the medical management of diabetes.  Current diabetes medication regimen: Metformin  1000mg  daily, Jardiance  10mg  (Patient was started on Rybelsus  3mg  but states he was never able to pick this up and never started)  Patient is  adhering to a diabetic diet.  Patient is not exercising regularly.  Patient is not checking BS regularly. Patient is  checking their feet regularly.  Denies polydipsia, polyphagia, polyuria, open wounds or ulcers on feet.   Lab Results  Component Value Date   HGBA1C 8.8 (H) 09/26/2023    Foot Exam: 03/31/2023 Lab Results  Component Value Date   MICROALBUR 30 04/28/2023    Wt Readings from Last 3 Encounters:  12/28/23 133 lb 9.6 oz (60.6 kg)  11/21/23 129 lb 6.4 oz (58.7 kg)  11/14/23 131 lb (59.4 kg)    CHRONIC BACK PAIN:  Patient reports hx  of chronic low back pain and spinal stenosis. He was referred by PCP to [facility] for management. He was seen by Dr. Eldonna on 06/23/2023 for chronic severe back pain.  His clinical presentation and exam are consistent with neurogenic claudication due to spinal stenosis.  His lumbar MRI in 2021 showed severe spinal stenosis at L4 and L5 with possible new disc herniation at L5-S1.  He was recommended to complete new lumbar MRI imaging and depending on the results could possibly perform steroid injection and/or needing a referral to spine surgeon Dr. Georgina.  He was recommended to return after lumbar MRI, but has not.  He is requesting a handicap placard today due to impaired ambulation due to pain and uses a cane for assistance.   The following portions of the patient's history were reviewed and updated as appropriate: past medical history, past surgical history, family history, social history, allergies, medications, and problem list.   Patient Active Problem List   Diagnosis Date Noted   CAD S/P percutaneous coronary angioplasty 11/15/2023   Stroke (HCC) 11/14/2023   Chronic low back pain 10/26/2023   NSTEMI (non-ST elevated myocardial infarction) (HCC) 09/25/2023   Impaired gait 03/31/2023   Type 2 diabetes mellitus with hyperglycemia, without long-term current use of insulin  (HCC) 03/31/2023   Neuropathic pain 03/31/2023   Spinal stenosis of lumbar region 04/22/2020   Acute pain of left knee 09/17/2019   Protrusion of lumbar intervertebral disc 05/29/2019   SOB (shortness of breath) 05/10/2013  Essential hypertension, benign 04/24/2013   Chest pain, unspecified 04/24/2013   Past Medical History:  Diagnosis Date   Asthma    Diabetes mellitus without complication (HCC)    Eczema    GERD (gastroesophageal reflux disease)    Hypertension    Myocardial infarction [facility])    Sciatica    Past Surgical History:  Procedure Laterality Date   CORONARY STENT  INTERVENTION N/A 09/26/2023   Procedure: CORONARY STENT INTERVENTION;  Surgeon: Swaziland, [name], MD;  Location: [facility] INVASIVE CV LAB;  Service: Cardiovascular;  Laterality: N/A;   HERNIA REPAIR     LEFT HEART CATH AND CORONARY ANGIOGRAPHY N/A 09/26/2023   Procedure: LEFT HEART CATH AND CORONARY ANGIOGRAPHY;  Surgeon: Swaziland, [name], MD;  Location: [facility] INVASIVE CV LAB;  Service: Cardiovascular;  Laterality: N/A;   Family History  Problem Relation Age of Onset   Cancer Mother    Cancer Father    Hypertension Sister    Heart murmur Sister    CVA Brother    CVA Brother    Hypertension Brother    Outpatient Medications Prior to Visit  Medication Sig Dispense Refill   amLODipine  (NORVASC ) 10 MG tablet Take 1 tablet (10 mg total) by mouth daily. 90 tablet 3   aspirin  EC 81 MG tablet Take 1 tablet (81 mg total) by mouth daily. Swallow whole. 90 tablet 3   atorvastatin  (LIPITOR) 80 MG tablet Take 1 tablet (80 mg total) by mouth daily. 90 tablet 3   clopidogrel  (PLAVIX ) 75 MG tablet Take 1 tablet (75 mg total) by mouth daily. 90 tablet 3   empagliflozin  (JARDIANCE ) 10 MG TABS tablet Take 1 tablet (10 mg total) by mouth daily. 30 tablet 11   losartan  (COZAAR ) 100 MG tablet Take 1 tablet (100 mg total) by mouth daily. 90 tablet 3   metFORMIN  (GLUCOPHAGE ) 500 MG tablet Take 1 tablet (500 mg total) by mouth daily. Take with meal. 90 tablet 0   metoprolol  succinate (TOPROL  XL) 25 MG 24 hr tablet Take 1 tablet (25 mg total) by mouth daily. 90 tablet 3   nitroGLYCERIN  (NITROSTAT ) 0.4 MG SL tablet Place 1 tablet (0.4 mg total) under the tongue every 5 (five) minutes x 3 doses as needed for chest pain. 25 tablet 0   pantoprazole  (PROTONIX ) 20 MG tablet Take 1 tablet (20 mg total) by mouth daily. 30 tablet 0   No facility-administered medications prior to visit.   No Known Allergies   ROS: A complete ROS was performed with pertinent positives/negatives noted in the HPI. The remainder of the ROS are negative.     Objective:   Today's Vitals   12/28/23 1306  BP: 134/78  Pulse: 67  SpO2: 100%  Weight: 133 lb 9.6 oz (60.6 kg)  Height: 5' 6 (1.676 m)    Physical Exam   GENERAL: Well-appearing, in NAD. Well nourished.  SKIN: Pink, warm and dry.  Head: Normocephalic. NECK: Trachea midline. Full ROM w/o pain or tenderness. RESPIRATORY: Chest wall symmetrical. Respirations even and non-labored. Breath sounds clear to auscultation bilaterally.  CARDIAC: S1, S2 present, regular rate and rhythm without murmur or gallops. Peripheral pulses 2+ bilaterally.  MSK: Muscle tone and strength appropriate for age. NEUROLOGIC: No motor or sensory deficits. Steady, even gait. C2-C12 intact.  PSYCH/MENTAL STATUS: Alert, oriented x 3. Cooperative, appropriate mood and affect.     Assessment & Plan:  1. Essential hypertension, benign (Primary) Improved and blood pressure well-controlled currently.  Will repeat BMP with lab  work since increasing ACE inhibitor.  Recommend he continue on current regimen and monitor blood pressure regularly at home - Basic metabolic panel with GFR  2. Type 2 diabetes mellitus with hyperglycemia, without long-term current use of insulin  (HCC) Discussed possible need for adding on GLP-1 such as Rybelsus  pending A1c today.  Will look into coverage pending his lab results for further medication therapy.  Discussed dietary changes to help control of type 2 diabetes with patient. - Hemoglobin A1c  3. Chronic low back pain, unspecified back pain laterality, unspecified whether sciatica present New referral placed to pain management per patient request for chronic pain relief.  We discussed possible alternative measures besides surgery and he would like evaluation first.  We discussed recommending completion of lumbar MRI as directed by pain management. - Ambulatory referral to Pain Clinic   No orders of the defined types were placed in this encounter.  Lab Orders         Basic  metabolic panel with GFR         Hemoglobin A1c     No images are attached to the encounter or orders placed in the encounter.  Return in about 4 months (around 04/28/2024) for ANNUAL PHYSICAL, DIABETES CHECK UP, HYPERTENSION.    Patient to reach out to office if new, worrisome, or unresolved symptoms arise or if no improvement in patient's condition. Patient verbalized understanding and is agreeable to treatment plan. All questions answered to patient's satisfaction.    Thersia Schuyler Stark, [place]

## 2023-12-28 NOTE — Patient Instructions (Signed)
[facility] [address] [place], [place] [address] [phone]   

## 2023-12-29 ENCOUNTER — Other Ambulatory Visit (HOSPITAL_COMMUNITY): Payer: Self-pay

## 2023-12-29 DIAGNOSIS — G2581 Restless legs syndrome: Secondary | ICD-10-CM | POA: Diagnosis not present

## 2023-12-29 DIAGNOSIS — M5412 Radiculopathy, cervical region: Secondary | ICD-10-CM | POA: Diagnosis not present

## 2023-12-29 DIAGNOSIS — G609 Hereditary and idiopathic neuropathy, unspecified: Secondary | ICD-10-CM | POA: Diagnosis not present

## 2023-12-29 DIAGNOSIS — R634 Abnormal weight loss: Secondary | ICD-10-CM | POA: Diagnosis not present

## 2023-12-29 DIAGNOSIS — G3184 Mild cognitive impairment, so stated: Secondary | ICD-10-CM | POA: Diagnosis not present

## 2023-12-29 DIAGNOSIS — G603 Idiopathic progressive neuropathy: Secondary | ICD-10-CM | POA: Diagnosis not present

## 2023-12-29 DIAGNOSIS — M5417 Radiculopathy, lumbosacral region: Secondary | ICD-10-CM | POA: Diagnosis not present

## 2023-12-29 DIAGNOSIS — I639 Cerebral infarction, unspecified: Secondary | ICD-10-CM | POA: Diagnosis not present

## 2023-12-29 DIAGNOSIS — E538 Deficiency of other specified B group vitamins: Secondary | ICD-10-CM | POA: Diagnosis not present

## 2023-12-29 DIAGNOSIS — E531 Pyridoxine deficiency: Secondary | ICD-10-CM | POA: Diagnosis not present

## 2023-12-29 DIAGNOSIS — G5603 Carpal tunnel syndrome, bilateral upper limbs: Secondary | ICD-10-CM | POA: Diagnosis not present

## 2023-12-29 DIAGNOSIS — R202 Paresthesia of skin: Secondary | ICD-10-CM | POA: Diagnosis not present

## 2023-12-29 LAB — HEMOGLOBIN A1C
Est. average glucose Bld gHb Est-mCnc: 186 mg/dL
Hgb A1c MFr Bld: 8.1 % — ABNORMAL HIGH (ref 4.8–5.6)

## 2023-12-29 LAB — BASIC METABOLIC PANEL WITH GFR
BUN/Creatinine Ratio: 12 (ref 9–20)
BUN: 11 mg/dL (ref 6–24)
CO2: 18 mmol/L — ABNORMAL LOW (ref 20–29)
Calcium: 9.1 mg/dL (ref 8.7–10.2)
Chloride: 104 mmol/L (ref 96–106)
Creatinine, Ser: 0.9 mg/dL (ref 0.76–1.27)
Glucose: 223 mg/dL — ABNORMAL HIGH (ref 70–99)
Potassium: 4.1 mmol/L (ref 3.5–5.2)
Sodium: 138 mmol/L (ref 134–144)
eGFR: 101 mL/min/1.73 (ref >59)

## 2023-12-29 MED ORDER — ROPINIROLE HCL 0.5 MG PO TABS
0.5000 mg | ORAL_TABLET | Freq: Every evening | ORAL | 1 refills | Status: AC
  Filled 2023-12-29: qty 60, 30d supply, fill #0

## 2023-12-30 ENCOUNTER — Other Ambulatory Visit (HOSPITAL_COMMUNITY): Payer: Self-pay

## 2024-01-01 ENCOUNTER — Ambulatory Visit (HOSPITAL_BASED_OUTPATIENT_CLINIC_OR_DEPARTMENT_OTHER): Payer: Self-pay | Admitting: Family Medicine

## 2024-01-01 NOTE — Progress Notes (Signed)
 [name],  Your A1C has improved. We would like to continue this progress and get the A1C under 7.0 for control of diabetes. We could increase your Metformin  to 1000mg  twice a day to help if you would like to try this. There are other medications such as Glipizide  or a GLP1 that may also be helpful. Please let me know how you would prefer to proceed.

## 2024-01-02 ENCOUNTER — Other Ambulatory Visit (HOSPITAL_COMMUNITY): Payer: Self-pay

## 2024-01-02 ENCOUNTER — Other Ambulatory Visit (HOSPITAL_BASED_OUTPATIENT_CLINIC_OR_DEPARTMENT_OTHER): Payer: Self-pay | Admitting: Family Medicine

## 2024-01-02 MED ORDER — METFORMIN HCL 1000 MG PO TABS
1000.0000 mg | ORAL_TABLET | Freq: Two times a day (BID) | ORAL | 3 refills | Status: AC
  Filled 2024-01-02: qty 180, 90d supply, fill #0

## 2024-01-02 NOTE — Telephone Encounter (Signed)
 Pt said he is okay having medication increased.

## 2024-01-03 NOTE — Progress Notes (Deleted)
 Patient: Peter Bell Date of Birth: 1970-08-09  Reason for Visit: Stroke Clinic Follow Up History from: Patient Primary Neurologist: Rosemarie   ASSESSMENT AND PLAN 53 y.o. 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  Presented with slurred speech, diplopia and dysphagia for 4 days.  Vascular risk factors: DM, CAD, HTN, HLD   HISTORY OF PRESENT ILLNESS: Today 01/03/24  HISTORY  Admitted in July 2025 for slurred speech, diplopia and dysphagia for 4 days.  Had a recent CAD status post stenting June 2025 on DAPT.  Right paramedian inferior pontine infarct likely secondary to small vessel disease. Outside TPA window.   - CT head old left pontine infarct - CTA head and neck left P1 occlusion with distal reconstitution, mild stenosis of proximal basilar artery.  Moderate stenosis of right anterior cerebral artery A2 segment. - MRI of the brain right paramedian pontine small infarcts - 2D echo EF 55 to 60% - LDL 60, on Lipitor  - A1c 8.8 - P2Y12=121 - UDS negative - Aspirin  81 mg daily and Plavix  75 mg daily prior to admission, continue on discharge   REVIEW OF SYSTEMS: Out of a complete 14 system review of symptoms, the patient complains only of the following symptoms, and all other reviewed systems are negative.  See HPI  ALLERGIES: No Known Allergies  HOME MEDICATIONS: Outpatient Medications Prior to Visit  Medication Sig Dispense Refill   amLODipine  (NORVASC ) 10 MG tablet Take 1 tablet (10 mg total) by mouth daily. 90 tablet 3   aspirin  EC 81 MG tablet Take 1 tablet (81 mg total) by mouth daily. Swallow whole. 90 tablet 3   atorvastatin  (LIPITOR) 80 MG tablet Take 1 tablet (80 mg total) by mouth daily. 90 tablet 3   clopidogrel  (PLAVIX ) 75 MG tablet Take 1 tablet (75 mg total) by mouth daily. 90 tablet 3   empagliflozin  (JARDIANCE ) 10 MG TABS tablet Take 1 tablet (10 mg total) by mouth daily. 30 tablet 11    losartan  (COZAAR ) 100 MG tablet Take 1 tablet (100 mg total) by mouth daily. 90 tablet 3   metFORMIN  (GLUCOPHAGE ) 1000 MG tablet Take 1 tablet (1,000 mg total) by mouth 2 (two) times daily with a meal. 180 tablet 3   metoprolol  succinate (TOPROL  XL) 25 MG 24 hr tablet Take 1 tablet (25 mg total) by mouth daily. 90 tablet 3   nitroGLYCERIN  (NITROSTAT ) 0.4 MG SL tablet Place 1 tablet (0.4 mg total) under the tongue every 5 (five) minutes x 3 doses as needed for chest pain. 25 tablet 0   pantoprazole  (PROTONIX ) 20 MG tablet Take 1 tablet (20 mg total) by mouth daily. 30 tablet 0   rOPINIRole  (REQUIP ) 0.5 MG tablet Take 1-2 tablets (0.5-1 mg total) by mouth 1 to 3 hours before bedtime. 60 tablet 1   No facility-administered medications prior to visit.    PAST MEDICAL HISTORY: Past Medical History:  Diagnosis Date   Asthma    Diabetes mellitus without complication (HCC)    Eczema    GERD (gastroesophageal reflux disease)    Hypertension    Myocardial infarction [facility])    Sciatica     PAST SURGICAL HISTORY: Past Surgical History:  Procedure Laterality Date   CORONARY STENT INTERVENTION N/A 09/26/2023   Procedure: CORONARY STENT INTERVENTION;  Surgeon: Swaziland, [name], MD;  Location: [facility] INVASIVE CV LAB;  Service: Cardiovascular;  Laterality: N/A;   HERNIA REPAIR     LEFT HEART  CATH AND CORONARY ANGIOGRAPHY N/A 09/26/2023   Procedure: LEFT HEART CATH AND CORONARY ANGIOGRAPHY;  Surgeon: Swaziland, [name], MD;  Location: [facility] INVASIVE CV LAB;  Service: Cardiovascular;  Laterality: N/A;    FAMILY HISTORY: Family History  Problem Relation Age of Onset   Cancer Mother    Cancer Father    Hypertension Sister    Heart murmur Sister    CVA Brother    CVA Brother    Hypertension Brother     SOCIAL HISTORY: Social History   Socioeconomic History   Marital status: Divorced    Spouse name: Not on file   Number of children: Not on file   Years of education: Not on file   Highest education  level: Not on file  Occupational History   Not on file  Tobacco Use   Smoking status: Never    Passive exposure: Never   Smokeless tobacco: Never  Vaping Use   Vaping status: Never Used  Substance and Sexual Activity   Alcohol use: Not Currently   Drug use: No   Sexual activity: Not Currently  Other Topics Concern   Not on file  Social History Narrative   Not on file   Social Drivers of Health   Financial Resource Strain: Medium Risk (10/24/2023)   Overall Financial Resource Strain (CARDIA)    Difficulty of Paying Living Expenses: Somewhat hard  Food Insecurity: Food Insecurity Present (11/16/2023)   Hunger Vital Sign    Worried About Running Out of Food in the Last Year: Often true    Ran Out of Food in the Last Year: Sometimes true  Transportation Needs: No Transportation Needs (10/24/2023)   PRAPARE - Administrator, Civil Service (Medical): No    Lack of Transportation (Non-Medical): No  Physical Activity: Inactive (10/24/2023)   Exercise Vital Sign    Days of Exercise per Week: 2 days    Minutes of Exercise per Session: 0 min  Stress: No Stress Concern Present (10/24/2023)   [employer] of Occupational Health - Occupational Stress Questionnaire    Feeling of Stress: Only a little  Social Connections: Unknown (10/24/2023)   Social Connection and Isolation Panel    Frequency of Communication with Friends and Family: Not on file    Frequency of Social Gatherings with Friends and Family: Not on file    Attends Religious Services: Not on file    Active Member of Clubs or Organizations: No    Attends Banker Meetings: Not on file    Marital Status: Not on file  Intimate Partner Violence: Not At Risk (11/16/2023)   Humiliation, Afraid, Rape, and Kick questionnaire    Fear of Current or Ex-Partner: No    Emotionally Abused: No    Physically Abused: No    Sexually Abused: No    PHYSICAL EXAM  There were no vitals filed for this visit. There  is no height or weight on file to calculate BMI.  Generalized: Well developed, in no acute distress  Neurological examination  Mentation: Alert oriented to time, place, history taking. Follows all commands speech and language fluent Cranial nerve II-XII: Pupils were equal round reactive to light. Extraocular movements were full, visual field were full on confrontational test. Facial sensation and strength were normal. Uvula tongue midline. Head turning and shoulder shrug  were normal and symmetric. Motor: The motor testing reveals 5 over 5 strength of all 4 extremities. Good symmetric motor tone is noted throughout.  Sensory: Sensory testing  is intact to soft touch on all 4 extremities. No evidence of extinction is noted.  Coordination: Cerebellar testing reveals good finger-nose-finger and heel-to-shin bilaterally.  Gait and station: Gait is normal. Tandem gait is normal. Romberg is negative. No drift is seen.  Reflexes: Deep tendon reflexes are symmetric and normal bilaterally.   DIAGNOSTIC DATA (LABS, IMAGING, TESTING) - I reviewed patient records, labs, notes, testing and imaging myself where available.  Lab Results  Component Value Date   WBC 11.3 (H) 11/16/2023   HGB 13.0 11/16/2023   HCT 39.3 11/16/2023   MCV 92.7 11/16/2023   PLT 307 11/16/2023      Component Value Date/Time   NA 138 12/28/2023 1342   K 4.1 12/28/2023 1342   CL 104 12/28/2023 1342   CO2 18 (L) 12/28/2023 1342   GLUCOSE 223 (H) 12/28/2023 1342   GLUCOSE 130 (H) 11/16/2023 0322   BUN 11 12/28/2023 1342   CREATININE 0.90 12/28/2023 1342   CALCIUM  9.1 12/28/2023 1342   PROT 7.4 11/14/2023 1215   ALBUMIN 3.6 11/14/2023 1215   AST 21 11/14/2023 1215   ALT 14 11/14/2023 1215   ALKPHOS 103 11/14/2023 1215   BILITOT 0.9 11/14/2023 1215   GFRNONAA >60 11/16/2023 0322   GFRAA >60 08/15/2017 2115   Lab Results  Component Value Date   CHOL 96 11/15/2023   HDL 28 (L) 11/15/2023   LDLCALC 60 11/15/2023   TRIG  42 11/15/2023   CHOLHDL 3.4 11/15/2023   Lab Results  Component Value Date   HGBA1C 8.1 (H) 12/28/2023   No results found for: [id number] Lab Results  Component Value Date   TSH 0.812 ***Test methodology is 3rd generation TSH*** 12/24/2006    Lauraine Born, AGNP-C, DNP 01/03/2024, 4:37 PM [facility] [address] [place], [place] [address] [phone]

## 2024-01-03 NOTE — Therapy (Signed)
 OUTPATIENT PHYSICAL THERAPY NEURO TREATMENT   Patient Name: Peter Bell MRN: [id number] DOB:March 24, 1971, 53 y.o., male Today's Date: 01/03/2024   PCP: Thersia Stark REFERRING PROVIDER: Lonni Dalton  END OF SESSION:     Past Medical History:  Diagnosis Date   Asthma    Diabetes mellitus without complication (HCC)    Eczema    GERD (gastroesophageal reflux disease)    Hypertension    Myocardial infarction [facility])    Sciatica    Past Surgical History:  Procedure Laterality Date   CORONARY STENT INTERVENTION N/A 09/26/2023   Procedure: CORONARY STENT INTERVENTION;  Surgeon: Swaziland, [name], MD;  Location: [facility] INVASIVE CV LAB;  Service: Cardiovascular;  Laterality: N/A;   HERNIA REPAIR     LEFT HEART CATH AND CORONARY ANGIOGRAPHY N/A 09/26/2023   Procedure: LEFT HEART CATH AND CORONARY ANGIOGRAPHY;  Surgeon: Swaziland, [name], MD;  Location: [facility] INVASIVE CV LAB;  Service: Cardiovascular;  Laterality: N/A;   Patient Active Problem List   Diagnosis Date Noted   CAD S/P percutaneous coronary angioplasty 11/15/2023   Stroke (HCC) 11/14/2023   Chronic low back pain 10/26/2023   NSTEMI (non-ST elevated myocardial infarction) (HCC) 09/25/2023   Impaired gait 03/31/2023   Type 2 diabetes mellitus with hyperglycemia, without long-term current use of insulin  (HCC) 03/31/2023   Neuropathic pain 03/31/2023   Spinal stenosis of lumbar region 04/22/2020   Acute pain of left knee 09/17/2019   Protrusion of lumbar intervertebral disc 05/29/2019   SOB (shortness of breath) 05/10/2013   Essential hypertension, benign 04/24/2013   Chest pain, unspecified 04/24/2013    ONSET DATE: 11/14/23  REFERRING DIAG:  I63.9 (ICD-10-CM) - Acute ischemic stroke (HCC)    THERAPY DIAG:  No diagnosis found.  Rationale for Evaluation and Treatment: Rehabilitation  SUBJECTIVE:                                                                                                                                                                                              SUBJECTIVE STATEMENT: The back of the legs are still hurting.    PERTINENT HISTORY: Peter Bell is a 53 y.o. male with medical history significant of DM2, CAD spinal stenosis   Presented with change in sensation left arm Hx of DM2, CAD, HTN. Here with 4 days of trouble swallowing, numbness of left hand and arm, double vision. MRI showing acute CVA in the pons Unable to detect on cold or hot in his left hand and arm for past 4 days. No weakness in the left arm no change in sensation or strength in his left leg. Did feel  that his speech was slurred  PAIN:  Are you having pain? Yes: NPRS scale: 8/10 when standing or walking, no pain when sitting Pain location: legs  Pain description: feelings like charlie horses in my hamstrings all the time  Aggravating factors: walking and standing  Relieving factors: nothing really  PRECAUTIONS: Fall  RED FLAGS: None   WEIGHT BEARING RESTRICTIONS: No  FALLS: Has patient fallen in last 6 months? No  LIVING ENVIRONMENT: Lives with: lives with an adult companion Lives in: House/apartment Stairs: Yes: Internal: 18 steps; on left going up Has following equipment at home: Single point cane and Walker - 4 wheeled  PLOF: Independent, Independent with basic ADLs, and Independent with gait  PATIENT GOALS: to get better than what I am   OBJECTIVE:  Note: Objective measures were completed at Evaluation unless otherwise noted.  DIAGNOSTIC FINDINGS:  IMPRESSION: 1. 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 2. Mild stenosis of the proximal basilar artery. 3. Moderate stenosis of the right anterior cerebral artery A2 segment.  MPRESSION: 1. Small acute infarct within the right pons in the region of the paramedian pontine reticular formation, likely cause of diplopia. 2. 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 3. Multifocal hyperintense T2-weighted signal  within the cerebral white matter, most commonly due to chronic small vessel disease. 4. Normal MRI of the orbits  COGNITION: Overall cognitive status: Within functional limits for tasks assessed   SENSATION: WFL Hot/Cold: Impaired in LUE  COORDINATION: Decreased coordination   MUSCLE LENGTH: Hamstrings: very tight in HS  POSTURE: rounded shoulders, decreased lumbar lordosis, and R shoulder is lowered   LOWER EXTREMITY ROM:   WFL    LOWER EXTREMITY MMT:  5/5 BLE   GAIT: Findings: Gait Characteristics: decreased arm swing- Right, decreased step length- Left, decreased stance time- Right, decreased stride length, decreased hip/knee flexion- Right, ataxic, antalgic, wide BOS, and poor foot clearance- Right, Distance walked: in clinic distances, and Comments: antalgic gait that he reports has been like this for almost 2 years and says it is due to spinal stenosis and pain in back of legs   FUNCTIONAL TESTS:  5 times sit to stand: 10s Timed up and go (TUG): 16s                                                                                                                              TREATMENT DATE: 01/04/24 NuStep Calf stretch TUG  Side steps on beam Cone taps in bars  Leg ext HS curls  Leg press STS   12/13/23 NuStep L5 x59mins Leg ext 10# 2x10 HS curls 25# 2x10 Shoulder ext 5# 2x10 STS 2x10 Step ups 6 2 way hip 2# in bars 2x12 Rows and ext green 2x15 Horizontal abd 2x15 Calf stretch 20s x3   12/05/23- EVAL, POC    PATIENT EDUCATION: Education details: POC, HEP, fall risk,  Person educated: Patient Education method: Explanation Education comprehension: verbalized understanding  HOME EXERCISE PROGRAM: Access Code: [email or web address] URL: [email or web address] Date: 12/05/2023 Prepared by: Almetta Fam  Exercises - Sit to Stand  - 1 x daily - 7 x weekly - 2 sets - 10 reps - Seated Hamstring Stretch  - 1 x daily - 7 x weekly - 3 reps - 15 hold -  Standing Hip Abduction with Counter Support  - 1 x daily - 7 x weekly - 2 sets - 10 reps - Supine Bridge  - 1 x daily - 7 x weekly - 2 sets - 10 reps  GOALS: Goals reviewed with patient? Yes  SHORT TERM GOALS: Target date: 01/09/24  Patient will be independent with initial HEP.  Baseline:  Goal status: INITIAL  2.  Patient will complete TUG < 13s  Baseline: 16 Goal status: INITIAL   LONG TERM GOALS: Target date: 02/13/24  Patient will be independent with advanced/ongoing HEP to improve outcomes and carryover.  Baseline:  Goal status: INITIAL  2.  Patient will report 50% improvement in low back and hamstrings to improve QOL.  Baseline: 8/10 with activity  Goal status: INITIAL  3.  Patient will improve BERG to 50/56 to decrease risk for falls.  Baseline: 42/56 Goal status: INITIAL  4.  Patient will be able to walk 100-231ft with normalized gait pattern.    Baseline: limping on RLE Goal status: INITIAL  5.  Patient will tolerate 15-20 min of standing/sitting/walking. Baseline: < 5 mins  Goal status: INITIAL   ASSESSMENT:  CLINICAL IMPRESSION: Patient is a 54 y.o. male who was seen today for physical therapy treatment for CVA. He is ambulating with a significant limp and attributes it to pain in his lower legs, especially on the R side. He report he has been walking like this since before the stroke and states it is due to spinal stenosis. We worked on strengthening, functional tasks, and postural strengthening today. He has some pain and difficulty with step ups. Reports soreness at end of visit. Will benefit from skilled PT to address his gait, balance, and pain to improve QOL and decrease his risk for falls.   OBJECTIVE IMPAIRMENTS: Abnormal gait, decreased balance, decreased endurance, difficulty walking, increased muscle spasms, improper body mechanics, postural dysfunction, and pain.   ACTIVITY LIMITATIONS: carrying, lifting, bending, standing, squatting, stairs, and  locomotion level  PARTICIPATION LIMITATIONS: cleaning, laundry, shopping, community activity, occupation, and yard work  PERSONAL FACTORS: Education, Fitness, Past/current experiences, and 1-2 comorbidities: chronic pain, impaired gait, neuropathic pain, DM, Stroke are also affecting patient's functional outcome.   REHAB POTENTIAL: Good  CLINICAL DECISION MAKING: Evolving/moderate complexity  EVALUATION COMPLEXITY: Moderate  PLAN:  PT FREQUENCY: 1-2x/week  PT DURATION: 10 weeks  PLANNED INTERVENTIONS: 97110-Therapeutic exercises, 97530- Therapeutic activity, 97112- Neuromuscular re-education, 97535- Self Care, 02859- Manual therapy, [phone]- Gait training, [phone]- Electrical stimulation (unattended), [phone] (1-2 muscles), [phone] (3+ muscles)- Dry Needling, Patient/Family education, Balance training, Stair training, Taping, Joint mobilization, Joint manipulation, Spinal manipulation, Spinal mobilization, Cryotherapy, and Moist heat  PLAN FOR NEXT SESSION: balance, gait training, LE stretching and strengthening   Almetta Fam, PT 01/03/2024, 4:35 PM

## 2024-01-04 ENCOUNTER — Ambulatory Visit: Admitting: Speech Pathology

## 2024-01-04 ENCOUNTER — Ambulatory Visit: Attending: Family Medicine | Admitting: Occupational Therapy

## 2024-01-04 ENCOUNTER — Ambulatory Visit

## 2024-01-04 ENCOUNTER — Encounter: Payer: Self-pay | Admitting: Neurology

## 2024-01-04 ENCOUNTER — Telehealth: Payer: Self-pay | Admitting: Neurology

## 2024-01-04 ENCOUNTER — Inpatient Hospital Stay: Admitting: Neurology

## 2024-01-04 ENCOUNTER — Encounter: Payer: Self-pay | Admitting: Occupational Therapy

## 2024-01-04 DIAGNOSIS — M5459 Other low back pain: Secondary | ICD-10-CM | POA: Insufficient documentation

## 2024-01-04 DIAGNOSIS — M48061 Spinal stenosis, lumbar region without neurogenic claudication: Secondary | ICD-10-CM | POA: Insufficient documentation

## 2024-01-04 DIAGNOSIS — R2689 Other abnormalities of gait and mobility: Secondary | ICD-10-CM

## 2024-01-04 DIAGNOSIS — M79661 Pain in right lower leg: Secondary | ICD-10-CM | POA: Insufficient documentation

## 2024-01-04 DIAGNOSIS — R278 Other lack of coordination: Secondary | ICD-10-CM

## 2024-01-04 DIAGNOSIS — M79662 Pain in left lower leg: Secondary | ICD-10-CM | POA: Insufficient documentation

## 2024-01-04 DIAGNOSIS — I639 Cerebral infarction, unspecified: Secondary | ICD-10-CM | POA: Diagnosis not present

## 2024-01-04 DIAGNOSIS — R41840 Attention and concentration deficit: Secondary | ICD-10-CM | POA: Diagnosis not present

## 2024-01-04 DIAGNOSIS — R41842 Visuospatial deficit: Secondary | ICD-10-CM | POA: Diagnosis not present

## 2024-01-04 DIAGNOSIS — R41844 Frontal lobe and executive function deficit: Secondary | ICD-10-CM | POA: Insufficient documentation

## 2024-01-04 NOTE — Therapy (Addendum)
 OUTPATIENT OCCUPATIONAL THERAPY NEURO Treatment  Patient Name: LEWELLYN FULTZ MRN: [id number] DOB:05/25/1970, 53 y.o., male Today's Date: 01/04/2024  PCP: Dr. Knute REFERRING PROVIDER: Dr. Jonel  END OF SESSION:  OT End of Session - 01/04/24 1025     Visit Number 2    Number of Visits 11    Date for OT Re-Evaluation 02/28/24    Authorization Type [employer]    Authorization Time Period 11 weeks    OT Start Time 1025   pt late he had to move his car   OT Stop Time 1100    OT Time Calculation (min) 35 min           Past Medical History:  Diagnosis Date   Asthma    Diabetes mellitus without complication (HCC)    Eczema    GERD (gastroesophageal reflux disease)    Hypertension    Myocardial infarction [facility])    Sciatica    Past Surgical History:  Procedure Laterality Date   CORONARY STENT INTERVENTION N/A 09/26/2023   Procedure: CORONARY STENT INTERVENTION;  Surgeon: Swaziland, [name], MD;  Location: [facility] INVASIVE CV LAB;  Service: Cardiovascular;  Laterality: N/A;   HERNIA REPAIR     LEFT HEART CATH AND CORONARY ANGIOGRAPHY N/A 09/26/2023   Procedure: LEFT HEART CATH AND CORONARY ANGIOGRAPHY;  Surgeon: Swaziland, [name], MD;  Location: [facility] INVASIVE CV LAB;  Service: Cardiovascular;  Laterality: N/A;   Patient Active Problem List   Diagnosis Date Noted   CAD S/P percutaneous coronary angioplasty 11/15/2023   Stroke (HCC) 11/14/2023   Chronic low back pain 10/26/2023   NSTEMI (non-ST elevated myocardial infarction) (HCC) 09/25/2023   Impaired gait 03/31/2023   Type 2 diabetes mellitus with hyperglycemia, without long-term current use of insulin  (HCC) 03/31/2023   Neuropathic pain 03/31/2023   Spinal stenosis of lumbar region 04/22/2020   Acute pain of left knee 09/17/2019   Protrusion of lumbar intervertebral disc 05/29/2019   SOB (shortness of breath) 05/10/2013   Essential hypertension, benign 04/24/2013   Chest pain, unspecified 04/24/2013    ONSET DATE:  11/14/23  REFERRING DIAG:  I63.9 (ICD-10-CM) - Acute ischemic stroke (HCC)  THERAPY DIAG:  Attention and concentration deficit  Frontal lobe and executive function deficit  Other lack of coordination  Visuospatial deficit  Rationale for Evaluation and Treatment: Rehabilitation  SUBJECTIVE:   SUBJECTIVE STATEMENT: Pt reports back issues and leg pain Pt accompanied by: self  PERTINENT HISTORY: 53 y.o. male hospitalized 7/21 with difficulty swallowing, diplopia, LUE numbness for 4 days. MRI brain with small acute infarct within the R pons in the region of the paramedian pontine reticular formation. PMH significant of DM2, CAD, HTN, spinal stenosis, asthma  PRECAUTIONS: Fall  WEIGHT BEARING RESTRICTIONS: No  PAIN:  Are you having pain? Yes: NPRS scale: 9/10 Pain location: legs Pain description: aching Aggravating factors: walking Relieving factors: rest  FALLS: Has patient fallen in last 6 months? No  LIVING ENVIRONMENT: Lives with: lives with an adult companion Lives in: House/apartment   PLOF: Independent  PATIENT GOALS: maintain independence  OBJECTIVE:  Note: Objective measures were completed at Evaluation unless otherwise noted.  HAND DOMINANCE: Right  ADLs:mod I with all basic ADLS   IADLs: Pt reports at hospital cleared him to drive  Light housekeeping: girlfriend performs Meal Prep: pt does not cook Community mobility: mod I Medication management: pt handles Financial management: pt handles   MOBILITY STATUS: mod I, decreased balnce- pt reports balance issues prior to CVA  due to back issues   A  FUNCTIONAL OUTCOME MEASURES: Trailmaking A 1 error, Trailmaking B multiple errors  UPPER EXTREMITY ROM:  WFLS  (Blank rows = not tested)  UPPER EXTREMITY MMT:   bilateral UE's 4/5-5/5  (Blank rows = not tested)  HAND FUNCTION: Grip strength: Right: 65 lbs lbs; Left: 68 lbs  COORDINATION: 9 Hole Peg test: Right: 30.63 sec; Left: 33.56 sec  with multiple drops  SENSATION: Hot/Cold: Impaired  LUE   COGNITION: Overall cognitive status: Impaired, decreased attention, 1 error on Trailmaking A, multiple errors Trailmaking B- indicating decreased alternating attention. Pt was oriented to day, date and location  VISION: Subjective report: diplopia   VISION ASSESSMENT: Impaired Ocular ROM: WFL Tracking/Visual pursuits: Decreased smoothness with horizontal tracking Visual Fields: no apparent deficits Diplopia assessment: present in Right gaze      OBSERVATIONS: Pleasant gentleman who would like to improve his function.                                                                                                                             TREATMENT DATE: 01/04/24- Pt was late for OT as he parked in middle of parking lot and had to move his car.Pt reports vision has improved and he is just waiting on glasses. Therapist discussed with pt that he missed f/u with neurology this a.m. and should re-schedule and that he misses his ST appointment. Pt was instructed in coordination HEP and visual compensation strategies. Constant therapy: Environmental scanning on a mod distracting environment, 8/10 items located first pass, then pt located remaining 2. Alternating symbols level 3 for alternating attention 4 items 93% for alternating attention. Pt reports he is most concerned about his legs and he feels his arms are doing pretty well. Green theraband for shoulder horizontal abduction, biceps and triceps, bilaterally, min-mod v.c and demonstration. 12/13/23 eval, pt was shown horizontal tracking exercise with both eyes within range that pt does not have diplopia.        PATIENT EDUCATION: Education details:Pt was instructed in coordination HEP and visual compensation strategies.  Person educated: Patient Education method: Explanation, Demonstration, Verbal cues, and Handouts Education comprehension: verbalized  understanding, returned demonstration, and verbal cues required  HOME EXERCISE PROGRAM: eye HEP with card 12/13/23   GOALS: Goals reviewed with patient? Yes  SHORT TERM GOALS: Target date: 01/13/24  I with HEP for (vision, coordination) Baseline:dependent Goal status: met, basic vision issued on day of eval, however pt denies visual difficulty now , coordination issued 01/04/24  2.  Pt will vebalize understanding of compensatory strategies for visual deficits. Baseline: dependent Goal status: ongoing issued 01/04/24  3.  Pt will perform a functional alternating attention task with 80% or better accuracy Baseline: unable to correctly complete trail making B Goal status:met, alternating symbols 93% 01/04/24  4.  Pt will verbalize understanding of sensory precautions for LUE Baseline: needs reinforcement Goal status: met, 01/04/24 pt verbalizes understanding following education  LONG TERM GOALS: Target date: 02/28/24  Pt will perform environmental scanning in a busy environment with 90% or better accuracy and minimal reports of diplopia. Baseline: diplopia in right visual field with scanning Goal status: ongoing 80%  01/04/24  2.   Pt will perform a functional alternating attention task with 90% or better accuracy Baseline: unable to complete trail making B correctly Goal status: INITIAL  3.  Pt will perform a physical and cognitve task simultaneously with 90% or better accuracy in prep for driving. Baseline: difficulty with trailmaking B Goal status: INITIAL    ASSESSMENT:  CLINICAL IMPRESSION:Pt is progressing towards goals for alternating attention and coordiantion. Pt is primarily concerned about his walking now.   PERFORMANCE DEFICITS: in functional skills including ADLs, IADLs, coordination, dexterity, sensation, flexibility, Fine motor control, mobility, balance, endurance, decreased knowledge of precautions, decreased knowledge of use of DME, vision, and UE  functional use, cognitive skills including attention, learn, problem solving, safety awareness, sequencing, temperament/personality, thought, and understand, and psychosocial skills including coping strategies, environmental adaptation, habits, interpersonal interactions, and routines and behaviors.   IMPAIRMENTS: are limiting patient from ADLs, IADLs, rest and sleep, work, play, leisure, and social participation.   CO-MORBIDITIES: may have co-morbidities  that affects occupational performance. Patient will benefit from skilled OT to address above impairments and improve overall function.  MODIFICATION OR ASSISTANCE TO COMPLETE EVALUATION: No modification of tasks or assist necessary to complete an evaluation.  OT OCCUPATIONAL PROFILE AND HISTORY: Detailed assessment: Review of records and additional review of physical, cognitive, psychosocial history related to current functional performance.  CLINICAL DECISION MAKING: Moderate - several treatment options, min-mod task modification necessary  REHAB POTENTIAL: Good  EVALUATION COMPLEXITY: Low    PLAN:  OT FREQUENCY: 1x/week plus eval  OT DURATION: 10 weeks  PLANNED INTERVENTIONS: 97168 OT Re-evaluation, 97535 self care/ADL training, 02889 therapeutic exercise, 97530 therapeutic activity, 97112 neuromuscular re-education, 97140 manual therapy, 97035 ultrasound, 97018 paraffin, 02989 moist heat, 97010 cryotherapy, 97129 Cognitive training (first 15 min), 02869 Cognitive training(each additional 15 min), passive range of motion, balance training, visual/perceptual remediation/compensation, psychosocial skills training, energy conservation, coping strategies training, patient/family education, and DME and/or AE instructions  RECOMMENDED OTHER SERVICES: ST, PT  CONSULTED AND AGREED WITH PLAN OF CARE: Patient  PLAN FOR NEXT SESSION: work towards unmet goals  Sianne Tejada, OT 01/04/2024, 10:49 AM

## 2024-01-04 NOTE — Patient Instructions (Addendum)
 Vision compensations  1. Look for the edge of objects (to the left and/or right) so that you make sure you are seeing all of an object 2. Turn your head when walking, scan from side to side, particularly in busy environments 3. Use an organized scanning pattern. It's usually easier to scan from top to bottom, and left to right (like you are reading) 4. Double check yourself 5. Use a line guide (like a blank piece of paper) or your finger when reading     Coordination Activities  Perform the following activities for 10 minutes 1 times per day with left hand(s).  Rotate ball in fingertips (clockwise and counter-clockwise). Toss ball between hands. Flip cards 1 at a time as fast as you can. Deal cards with your thumb (Hold deck in hand and push card off top with thumb). Pick up coins and stack. Pick up coins one at a time until you get 5-10 in your hand, then move coins from palm to fingertips to place in a container

## 2024-01-04 NOTE — Telephone Encounter (Signed)
 Patient has had three no shows with us  on 12/14/23, 12/21/23, 01/04/24

## 2024-01-05 ENCOUNTER — Encounter: Payer: Self-pay | Admitting: Neurology

## 2024-01-05 DIAGNOSIS — H5213 Myopia, bilateral: Secondary | ICD-10-CM | POA: Diagnosis not present

## 2024-01-09 ENCOUNTER — Ambulatory Visit: Admitting: Occupational Therapy

## 2024-01-09 ENCOUNTER — Ambulatory Visit: Admitting: Physical Therapy

## 2024-01-09 ENCOUNTER — Ambulatory Visit: Admitting: Speech Pathology

## 2024-01-09 ENCOUNTER — Encounter: Payer: Self-pay | Admitting: Physical Therapy

## 2024-01-09 DIAGNOSIS — M79661 Pain in right lower leg: Secondary | ICD-10-CM | POA: Diagnosis not present

## 2024-01-09 DIAGNOSIS — R2689 Other abnormalities of gait and mobility: Secondary | ICD-10-CM | POA: Diagnosis not present

## 2024-01-09 DIAGNOSIS — M48061 Spinal stenosis, lumbar region without neurogenic claudication: Secondary | ICD-10-CM | POA: Diagnosis not present

## 2024-01-09 DIAGNOSIS — M79662 Pain in left lower leg: Secondary | ICD-10-CM | POA: Diagnosis not present

## 2024-01-09 DIAGNOSIS — R278 Other lack of coordination: Secondary | ICD-10-CM | POA: Diagnosis not present

## 2024-01-09 DIAGNOSIS — R41840 Attention and concentration deficit: Secondary | ICD-10-CM | POA: Diagnosis not present

## 2024-01-09 DIAGNOSIS — R41844 Frontal lobe and executive function deficit: Secondary | ICD-10-CM | POA: Diagnosis not present

## 2024-01-09 DIAGNOSIS — I639 Cerebral infarction, unspecified: Secondary | ICD-10-CM | POA: Diagnosis not present

## 2024-01-09 DIAGNOSIS — M5459 Other low back pain: Secondary | ICD-10-CM | POA: Diagnosis not present

## 2024-01-09 DIAGNOSIS — R41842 Visuospatial deficit: Secondary | ICD-10-CM | POA: Diagnosis not present

## 2024-01-09 NOTE — Therapy (Signed)
 OUTPATIENT PHYSICAL THERAPY NEURO TREATMENT   Patient Name: Peter Bell MRN: [id number] DOB:02-Nov-1970, 53 y.o., male Today's Date: 01/09/2024   PCP: Thersia Stark REFERRING PROVIDER: Lonni Dalton  END OF SESSION:  PT End of Session - 01/09/24 1026     Visit Number 4    Date for PT Re-Evaluation 02/13/24    Authorization Type [place] [employer]    PT Start Time 1018    PT Stop Time 1056    PT Time Calculation (min) 38 min    Activity Tolerance Patient tolerated treatment well    Behavior During Therapy [facility] for tasks assessed/performed;Flat affect;Impulsive             Past Medical History:  Diagnosis Date   Asthma    Diabetes mellitus without complication (HCC)    Eczema    GERD (gastroesophageal reflux disease)    Hypertension    Myocardial infarction [facility])    Sciatica    Past Surgical History:  Procedure Laterality Date   CORONARY STENT INTERVENTION N/A 09/26/2023   Procedure: CORONARY STENT INTERVENTION;  Surgeon: Swaziland, [name], MD;  Location: [facility] INVASIVE CV LAB;  Service: Cardiovascular;  Laterality: N/A;   HERNIA REPAIR     LEFT HEART CATH AND CORONARY ANGIOGRAPHY N/A 09/26/2023   Procedure: LEFT HEART CATH AND CORONARY ANGIOGRAPHY;  Surgeon: Swaziland, [name], MD;  Location: [facility] INVASIVE CV LAB;  Service: Cardiovascular;  Laterality: N/A;   Patient Active Problem List   Diagnosis Date Noted   CAD S/P percutaneous coronary angioplasty 11/15/2023   Stroke (HCC) 11/14/2023   Chronic low back pain 10/26/2023   NSTEMI (non-ST elevated myocardial infarction) (HCC) 09/25/2023   Impaired gait 03/31/2023   Type 2 diabetes mellitus with hyperglycemia, without long-term current use of insulin  (HCC) 03/31/2023   Neuropathic pain 03/31/2023   Spinal stenosis of lumbar region 04/22/2020   Acute pain of left knee 09/17/2019   Protrusion of lumbar intervertebral disc 05/29/2019   SOB (shortness of breath) 05/10/2013   Essential hypertension, benign  04/24/2013   Chest pain, unspecified 04/24/2013    ONSET DATE: 11/14/23  REFERRING DIAG:  I63.9 (ICD-10-CM) - Acute ischemic stroke (HCC)    THERAPY DIAG:  Other lack of coordination  Other abnormalities of gait and mobility  Rationale for Evaluation and Treatment: Rehabilitation  SUBJECTIVE:                                                                                                                                                                                             SUBJECTIVE STATEMENT:  I didn't feel like I needed OT and speech no  more. Same old same old otherwise. Walking is the hardest thing for me to do right now.    PERTINENT HISTORY: DMARION Bell is a 53 y.o. male with medical history significant of DM2, CAD spinal stenosis   Presented with change in sensation left arm Hx of DM2, CAD, HTN. Here with 4 days of trouble swallowing, numbness of left hand and arm, double vision. MRI showing acute CVA in the pons Unable to detect on cold or hot in his left hand and arm for past 4 days. No weakness in the left arm no change in sensation or strength in his left leg. Did feel that his speech was slurred  PAIN:  Are you having pain? Yes: NPRS scale: 8/10 when standing or walking, no pain when sitting Pain location: legs  Pain description: feelings like charlie horses in my hamstrings all the time  Aggravating factors: walking and standing  Relieving factors: nothing really  PRECAUTIONS: Fall  RED FLAGS: None   WEIGHT BEARING RESTRICTIONS: No  FALLS: Has patient fallen in last 6 months? No  LIVING ENVIRONMENT: Lives with: lives with an adult companion Lives in: House/apartment Stairs: Yes: Internal: 18 steps; on left going up Has following equipment at home: Single point cane and Walker - 4 wheeled  PLOF: Independent, Independent with basic ADLs, and Independent with gait  PATIENT GOALS: to get better than what I am   OBJECTIVE:  Note: Objective  measures were completed at Evaluation unless otherwise noted.  DIAGNOSTIC FINDINGS:  IMPRESSION: 1. 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 2. Mild stenosis of the proximal basilar artery. 3. Moderate stenosis of the right anterior cerebral artery A2 segment.  MPRESSION: 1. Small acute infarct within the right pons in the region of the paramedian pontine reticular formation, likely cause of diplopia. 2. 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 3. Multifocal hyperintense T2-weighted signal within the cerebral white matter, most commonly due to chronic small vessel disease. 4. Normal MRI of the orbits  COGNITION: Overall cognitive status: Within functional limits for tasks assessed   SENSATION: WFL Hot/Cold: Impaired in LUE  COORDINATION: Decreased coordination   MUSCLE LENGTH: Hamstrings: very tight in HS  POSTURE: rounded shoulders, decreased lumbar lordosis, and R shoulder is lowered   LOWER EXTREMITY ROM:   WFL    LOWER EXTREMITY MMT:  5/5 BLE   GAIT: Findings: Gait Characteristics: decreased arm swing- Right, decreased step length- Left, decreased stance time- Right, decreased stride length, decreased hip/knee flexion- Right, ataxic, antalgic, wide BOS, and poor foot clearance- Right, Distance walked: in clinic distances, and Comments: antalgic gait that he reports has been like this for almost 2 years and says it is due to spinal stenosis and pain in back of legs   FUNCTIONAL TESTS:  5 times sit to stand: 10s Timed up and go (TUG): 16s  TREATMENT DATE:  01/09/24  Scifit bike L4x8 minutes seat 8 (nustep not available)  Bridges + ABD into green TB 2x10  Sidelying clams green TB x12 B Piriformis stretch 2x30 seconds B HS stretch with strap 1x30 seconds B STS green TB above knees x10 Standing hip ABD green TB  x10  B  QL stretch pushing stool forward seated 4x30 seconds   Tandem stance solid surface 2x30 seconds B  Narrow BOS x60 seconds blue foam pad       01/04/24 TUG- first trial almost falls d/t LOB, second trial 12.58s NuStep L4x29mins  Calf stretch 15s x2 HS stretch 30s  Feet on pball rotations and knees to chest  Bridges x10 Leg ext 10# 2x12 HS curls 25# 2x12 STS 2x10 Fitter pushes 2x10 1 blue and 1 black band  Side steps on beam    12/13/23 NuStep L5 x48mins Leg ext 10# 2x10 HS curls 25# 2x10 Shoulder ext 5# 2x10 STS 2x10 Step ups 6 2 way hip 2# in bars 2x12 Rows and ext green 2x15 Horizontal abd 2x15 Calf stretch 20s x3   12/05/23- EVAL, POC    PATIENT EDUCATION: Education details: POC, HEP, fall risk,  Person educated: Patient Education method: Explanation Education comprehension: verbalized understanding  HOME EXERCISE PROGRAM: Access Code: [email or web address] URL: [email or web address] Date: 12/05/2023 Prepared by: Almetta Fam  Exercises - Sit to Stand  - 1 x daily - 7 x weekly - 2 sets - 10 reps - Seated Hamstring Stretch  - 1 x daily - 7 x weekly - 3 reps - 15 hold - Standing Hip Abduction with Counter Support  - 1 x daily - 7 x weekly - 2 sets - 10 reps - Supine Bridge  - 1 x daily - 7 x weekly - 2 sets - 10 reps  GOALS: Goals reviewed with patient? Yes  SHORT TERM GOALS: Target date: 01/09/24  Patient will be independent with initial HEP.  Baseline:  Goal status: MET 01/09/24  2.  Patient will complete TUG < 13s  Baseline: 16 Goal status: MET 12.58s 01/04/24   LONG TERM GOALS: Target date: 02/13/24  Patient will be independent with advanced/ongoing HEP to improve outcomes and carryover.  Baseline:  Goal status: INITIAL  2.  Patient will report 50% improvement in low back and hamstrings to improve QOL.  Baseline: 8/10 with activity  Goal status: INITIAL  3.  Patient will improve BERG to 50/56 to decrease risk for falls.  Baseline:  42/56 Goal status: INITIAL  4.  Patient will be able to walk 100-268ft with normalized gait pattern.    Baseline: limping on RLE Goal status: INITIAL  5.  Patient will tolerate 15-20 min of standing/sitting/walking. Baseline: < 5 mins  Goal status: INITIAL   ASSESSMENT:  CLINICAL IMPRESSION:  Arrives today having had cancelled OT and speech therapies, reports he does not need/is not interested in these. Very unsteady with gait today, staggering quite a bit just walking back to treatment area of clinic. Has very poor safely awareness and insight into deficits. We worked on strength and balance as time allowed, multimodal cues given for safety and form during session. Standing interventions limited by pain today, modified session PRN.   OBJECTIVE IMPAIRMENTS: Abnormal gait, decreased balance, decreased endurance, difficulty walking, increased muscle spasms, improper body mechanics, postural dysfunction, and pain.   ACTIVITY LIMITATIONS: carrying, lifting, bending, standing, squatting, stairs, and locomotion level  PARTICIPATION LIMITATIONS: cleaning, laundry, shopping, community activity, occupation, and yard work  PERSONAL FACTORS: Education,  Fitness, Past/current experiences, and 1-2 comorbidities: chronic pain, impaired gait, neuropathic pain, DM, Stroke are also affecting patient's functional outcome.   REHAB POTENTIAL: Good  CLINICAL DECISION MAKING: Evolving/moderate complexity  EVALUATION COMPLEXITY: Moderate  PLAN:  PT FREQUENCY: 1-2x/week  PT DURATION: 10 weeks  PLANNED INTERVENTIONS: 97110-Therapeutic exercises, 97530- Therapeutic activity, [id number]- Neuromuscular re-education, 97535- Self Care, [id number]- Manual therapy, [phone]- Gait training, [phone]- Electrical stimulation (unattended), 20560 (1-2 muscles), [id number] (3+ muscles)- Dry Needling, Patient/Family education, Balance training, Stair training, Taping, Joint mobilization, Joint manipulation, Spinal manipulation, Spinal  mobilization, Cryotherapy, and Moist heat  PLAN FOR NEXT SESSION: balance, gait training, LE stretching and strengthening. Encourage LRAD to reduce fall risk    Josette Rough, PT, DPT 01/09/24 10:57 AM

## 2024-01-11 ENCOUNTER — Encounter: Payer: Self-pay | Admitting: Pharmacist Clinician (PhC)/ Clinical Pharmacy Specialist

## 2024-01-11 ENCOUNTER — Ambulatory Visit: Attending: Cardiology | Admitting: Pharmacist Clinician (PhC)/ Clinical Pharmacy Specialist

## 2024-01-11 ENCOUNTER — Other Ambulatory Visit (HOSPITAL_COMMUNITY): Payer: Self-pay

## 2024-01-11 ENCOUNTER — Telehealth: Payer: Self-pay | Admitting: Pharmacist Clinician (PhC)/ Clinical Pharmacy Specialist

## 2024-01-11 DIAGNOSIS — E785 Hyperlipidemia, unspecified: Secondary | ICD-10-CM | POA: Diagnosis not present

## 2024-01-11 MED ORDER — ATORVASTATIN CALCIUM 40 MG PO TABS
40.0000 mg | ORAL_TABLET | Freq: Every day | ORAL | 3 refills | Status: AC
  Filled 2024-01-11: qty 90, 90d supply, fill #0

## 2024-01-11 NOTE — Progress Notes (Signed)
 Patient ID: Peter Bell                 DOB: 1971-03-08                    MRN: [id number]      HPI: Peter Bell is a 53 y.o. male patient referred to lipid clinic by Jon Hails, PA. PMH is significant for NSTEMI CAD s/p PCI 09/2023, HTN, T2DM, HLD, acute ischemic stroke.  Patient was hospitalized 09/25/2023 for an NSTEMI. Complaints of chest pain and high BP at urgent care where they referred him to the hospital. Patient had been without HTN medications for 3 weeks. Patient underwent DES x3 and started on DAPT. HTN medications were added back and atorvastatin  80 mg added.  Followed up with cardiology post discharge on 10/21/23. No changes made at follow up. Patient hospitalized again 11/14/23 for acute ischemic stroke. No medication changes made during hospitalization.  Patient presents to Lipid clinic for initial PharmD appointment. Confirms taking atorvastatin  as prescribed. Patient endorses pain throughout his back. States that he has sciatica.   Discussed the purpose of today's visit and the importance of lowering cholesterol. Reviewed additional therapies to lower LDL-C and reduce future cardiovascular events. Discussed PCSK9i's mechanisms of action, dosing, side effects and potential decreases in LDL cholesterol. Patient is amendable to starting due to pill burden and decreased injection frequency. Discussed that insurance will require a prior authorization for which will Repatha take a few days.  Patient discussed lifestyle including diet and exercise. Currently enrolled in PT to exercise his back, legs, and mobility. Additionally does more exercises at home to regain walking ability. Meal patterns seem to be a heart healthy diet with chicken and veggies. Reports eating at [employer] as he can get a protein bowl and fresh veggie options.  Current Medications: atorvastatin  80 mg Intolerances: has not tried anything else Risk Factors: NSTEMI, acute ischemic stroke, premature ASCVD,  T2DM LDL-C goal: <55  ApoB goal: <80 LP(a): 200.0  Diet: chicken, eggs, broccoli, cabbage, grapes, banana (a lot), water (4 water bottles), juices (minute maid lemonade), soda  Exercise: PT (exercising legs, back, works on standing ability), exercising at home  Family History: not discussed in visit  Social History: non smoker, no alcohol use  Labs: Lipid Panel     Component Value Date/Time   CHOL 96 11/15/2023 0443   CHOL 108 03/31/2023 1001   TRIG 42 11/15/2023 0443   HDL 28 (L) 11/15/2023 0443   HDL 29 (L) 03/31/2023 1001   CHOLHDL 3.4 11/15/2023 0443   VLDL 8 11/15/2023 0443   LDLCALC 60 11/15/2023 0443   LDLCALC 66 03/31/2023 1001   LABVLDL 13 03/31/2023 1001    Past Medical History:  Diagnosis Date   Asthma    Diabetes mellitus without complication (HCC)    Eczema    GERD (gastroesophageal reflux disease)    Hypertension    Myocardial infarction (HCC)    Sciatica     Current Outpatient Medications on File Prior to Visit  Medication Sig Dispense Refill   amLODipine  (NORVASC ) 10 MG tablet Take 1 tablet (10 mg total) by mouth daily. 90 tablet 3   aspirin  EC 81 MG tablet Take 1 tablet (81 mg total) by mouth daily. Swallow whole. 90 tablet 3   atorvastatin  (LIPITOR) 80 MG tablet Take 1 tablet (80 mg total) by mouth daily. 90 tablet 3   clopidogrel  (PLAVIX ) 75 MG tablet Take 1 tablet (75 mg total) by  mouth daily. 90 tablet 3   empagliflozin  (JARDIANCE ) 10 MG TABS tablet Take 1 tablet (10 mg total) by mouth daily. 30 tablet 11   losartan  (COZAAR ) 100 MG tablet Take 1 tablet (100 mg total) by mouth daily. 90 tablet 3   metFORMIN  (GLUCOPHAGE ) 1000 MG tablet Take 1 tablet (1,000 mg total) by mouth 2 (two) times daily with a meal. 180 tablet 3   metoprolol  succinate (TOPROL  XL) 25 MG 24 hr tablet Take 1 tablet (25 mg total) by mouth daily. 90 tablet 3   nitroGLYCERIN  (NITROSTAT ) 0.4 MG SL tablet Place 1 tablet (0.4 mg total) under the tongue every 5 (five) minutes x 3  doses as needed for chest pain. 25 tablet 0   pantoprazole  (PROTONIX ) 20 MG tablet Take 1 tablet (20 mg total) by mouth daily. 30 tablet 0   rOPINIRole  (REQUIP ) 0.5 MG tablet Take 1-2 tablets (0.5-1 mg total) by mouth 1 to 3 hours before bedtime. 60 tablet 1   [DISCONTINUED] insulin  glargine (LANTUS) 100 UNIT/ML injection Inject 16 Units into the skin daily.     [DISCONTINUED] pravastatin (PRAVACHOL) 40 MG tablet Take 40 mg by mouth daily.     No current facility-administered medications on file prior to visit.    No Known Allergies  Assessment/Plan:  1. Hyperlipidemia -  Hyperlipidemia LDL goal <55 Assessment: LDL-C is not currently at goal <55 mg/dL Last LDL-C 60 mg/dL on atorvastatin  80 mg Discussed importance of lowering LDL-C to prevent future events Patient reports healthy lifestyle. Participating in PT. Cognizant about diet to help lower cholesterol Reviewed Repatha as an additional therapy to reduce LDL-C. Patient is amendable to adding Submit PA to insurance  Decrease atorvastatin  to 40 mg daily. There is little difference in LDL-C lowering of 80 mg to 40 mg. Patient would rather cut large tablet in half.  Plan: Submit Repatha PA - notify patient once approved via MyChart Initiate Repatha every 2 weeks Decrease atorvastatin  to 40 mg Follow up with lab work in 3 months    Jenkins Graces, PharmD PGY1 Pharmacy Resident [phone]   Thank you,  I was with Dr Graces and patient for entire visit and agree with above assessment and plan  Allean Mink PharmD [facility]

## 2024-01-11 NOTE — Assessment & Plan Note (Addendum)
 Assessment: LDL-C is not currently at goal <55 mg/dL Last LDL-C 60 mg/dL on atorvastatin  80 mg Discussed importance of lowering LDL-C to prevent future events Patient reports healthy lifestyle. Participating in PT. Cognizant about diet to help lower cholesterol Reviewed Repatha as an additional therapy to reduce LDL-C. Patient is amendable to adding Submit PA to insurance  Decrease atorvastatin  to 40 mg daily. There is little difference in LDL-C lowering of 80 mg to 40 mg. Patient would rather cut large tablet in half.  Plan: Submit Repatha PA - notify patient once approved via MyChart Initiate Repatha every 2 weeks Decrease atorvastatin  to 40 mg Follow up with lab work in 3 months

## 2024-01-11 NOTE — Telephone Encounter (Signed)
 Please do PA for Repatha. LDL goal is < 55

## 2024-01-11 NOTE — Patient Instructions (Signed)
 Your Results:             Your most recent labs Goal  Total Cholesterol 96 < 200  Triglycerides 42 < 150  HDL (happy/good cholesterol) 28 > 40  LDL (lousy/bad cholesterol 60 < 55   Medication changes:  We will start the process to get Repatha covered by your insurance.  Once the prior authorization is complete, I will call/send a MyChart message to let you know and confirm pharmacy information.   You will take atorvastatin  40 mg. Cut your atorvastatin  80 mg in half.  Lab orders:  We want to repeat labs after 2-3 months.  We will send you a lab order to remind you once we get closer to that time.    Patient Assistance:    We will sign you up for a [employer] Grant once your medication is approved by [employer].  I will call you with the ID number, then you will take this information to the pharmacy.  They will bill it after your insurance, bringing your copay to $0.  The grant will pay the first $2,500 in a one year period.    ID   BIN [id number]  PCN [id number]  GRP [id number]    Thank you for choosing [facility]

## 2024-01-12 ENCOUNTER — Telehealth: Payer: Self-pay | Admitting: Pharmacy Technician

## 2024-01-12 ENCOUNTER — Other Ambulatory Visit (HOSPITAL_COMMUNITY): Payer: Self-pay

## 2024-01-12 NOTE — Telephone Encounter (Signed)
 Pharmacy Patient Advocate Encounter  Received notification from [facility] MEDICA[employer] that Prior Authorization for repatha has been DENIED.  Full denial letter will be uploaded to the media tab. See denial reason below.     No record of the patient ever trying ezetimibe with a statin

## 2024-01-12 NOTE — Telephone Encounter (Signed)
   Pharmacy Patient Advocate Encounter   Received notification from Pt Calls Messages that prior authorization for REPATHA is required/requested.   Insurance verification completed.   The patient is insured through [facility] MEDICAID .   Per test claim: PA required; PA submitted to above mentioned insurance via Latent Key/confirmation #/EOC [id number] Status is pending

## 2024-01-13 ENCOUNTER — Encounter: Payer: Self-pay | Admitting: Physical Medicine & Rehabilitation

## 2024-01-16 ENCOUNTER — Telehealth (HOSPITAL_BASED_OUTPATIENT_CLINIC_OR_DEPARTMENT_OTHER): Payer: Self-pay | Admitting: *Deleted

## 2024-01-16 NOTE — Telephone Encounter (Signed)
 Called and spoke with pt letting him know that we did receive the clearance form from [facility]. Let him know that we did fax this back to them. Confirmation was also received that it went through. Told pt that [facility] will need to contact cardiology due to him being on blood thinner.  Pt requested to have form sent to him as he stated [facility] told him they never received it back from us . Copy sent to pt. Nothing further needed.

## 2024-01-16 NOTE — Telephone Encounter (Signed)
 Copied from CRM [id number]. Topic: Clinical - Medical Advice >> Jan 16, 2024  3:52 PM Anairis L wrote: Reason for CRM: Patient wants to know if we received a clearance from [facility] to start working on his teeth. Please update patient

## 2024-01-18 ENCOUNTER — Ambulatory Visit: Admitting: Physical Therapy

## 2024-01-18 ENCOUNTER — Encounter: Payer: Self-pay | Admitting: Physical Therapy

## 2024-01-18 DIAGNOSIS — I639 Cerebral infarction, unspecified: Secondary | ICD-10-CM | POA: Diagnosis not present

## 2024-01-18 DIAGNOSIS — R41842 Visuospatial deficit: Secondary | ICD-10-CM | POA: Diagnosis not present

## 2024-01-18 DIAGNOSIS — R278 Other lack of coordination: Secondary | ICD-10-CM

## 2024-01-18 DIAGNOSIS — R2689 Other abnormalities of gait and mobility: Secondary | ICD-10-CM | POA: Diagnosis not present

## 2024-01-18 DIAGNOSIS — M79662 Pain in left lower leg: Secondary | ICD-10-CM | POA: Diagnosis not present

## 2024-01-18 DIAGNOSIS — R41844 Frontal lobe and executive function deficit: Secondary | ICD-10-CM | POA: Diagnosis not present

## 2024-01-18 DIAGNOSIS — M79661 Pain in right lower leg: Secondary | ICD-10-CM | POA: Diagnosis not present

## 2024-01-18 DIAGNOSIS — M5459 Other low back pain: Secondary | ICD-10-CM

## 2024-01-18 DIAGNOSIS — R41840 Attention and concentration deficit: Secondary | ICD-10-CM | POA: Diagnosis not present

## 2024-01-18 DIAGNOSIS — M48061 Spinal stenosis, lumbar region without neurogenic claudication: Secondary | ICD-10-CM | POA: Diagnosis not present

## 2024-01-18 NOTE — Therapy (Signed)
 OUTPATIENT PHYSICAL THERAPY NEURO TREATMENT   Patient Name: FREDERIK STANDLEY MRN: [id number] DOB:Mar 17, 1971, 53 y.o., male Today's Date: 01/18/2024   PCP: Thersia Stark REFERRING PROVIDER: Lonni Dalton  END OF SESSION:  PT End of Session - 01/18/24 1439     Visit Number 5    Date for Recertification  02/13/24    Authorization Type [place] Medicaid [employer]    PT Start Time 1432    PT Stop Time 1510    PT Time Calculation (min) 38 min    Activity Tolerance Patient tolerated treatment well    Behavior During Therapy [facility] for tasks assessed/performed;Flat affect;Impulsive              Past Medical History:  Diagnosis Date   Asthma    Diabetes mellitus without complication (HCC)    Eczema    GERD (gastroesophageal reflux disease)    Hypertension    Myocardial infarction [facility])    Sciatica    Past Surgical History:  Procedure Laterality Date   CORONARY STENT INTERVENTION N/A 09/26/2023   Procedure: CORONARY STENT INTERVENTION;  Surgeon: Swaziland, [name], MD;  Location: [facility] INVASIVE CV LAB;  Service: Cardiovascular;  Laterality: N/A;   HERNIA REPAIR     LEFT HEART CATH AND CORONARY ANGIOGRAPHY N/A 09/26/2023   Procedure: LEFT HEART CATH AND CORONARY ANGIOGRAPHY;  Surgeon: Swaziland, [name], MD;  Location: [facility] INVASIVE CV LAB;  Service: Cardiovascular;  Laterality: N/A;   Patient Active Problem List   Diagnosis Date Noted   Hyperlipidemia LDL goal <55 01/11/2024   CAD S/P percutaneous coronary angioplasty 11/15/2023   Stroke (HCC) 11/14/2023   Chronic low back pain 10/26/2023   NSTEMI (non-ST elevated myocardial infarction) (HCC) 09/25/2023   Impaired gait 03/31/2023   Type 2 diabetes mellitus with hyperglycemia, without long-term current use of insulin  (HCC) 03/31/2023   Neuropathic pain 03/31/2023   Spinal stenosis of lumbar region 04/22/2020   Acute pain of left knee 09/17/2019   Protrusion of lumbar intervertebral disc 05/29/2019   SOB (shortness of breath)  05/10/2013   Essential hypertension, benign 04/24/2013   Chest pain, unspecified 04/24/2013    ONSET DATE: 11/14/23  REFERRING DIAG:  I63.9 (ICD-10-CM) - Acute ischemic stroke (HCC)    THERAPY DIAG:  Other lack of coordination  Other abnormalities of gait and mobility  Acute ischemic stroke (HCC)  Other low back pain  Rationale for Evaluation and Treatment: Rehabilitation  SUBJECTIVE:  SUBJECTIVE STATEMENT:  Nothing new since last time   PERTINENT HISTORY: KYLEN SCHLIEP is a 53 y.o. male with medical history significant of DM2, CAD spinal stenosis   Presented with change in sensation left arm Hx of DM2, CAD, HTN. Here with 4 days of trouble swallowing, numbness of left hand and arm, double vision. MRI showing acute CVA in the pons Unable to detect on cold or hot in his left hand and arm for past 4 days. No weakness in the left arm no change in sensation or strength in his left leg. Did feel that his speech was slurred  PAIN:  Are you having pain? Yes: NPRS scale: 8-9/10 when standing or walking, no pain when sitting Pain location: legs  Pain description: feelings like charlie horses in my hamstrings all the time  Aggravating factors: walking and standing  Relieving factors: nothing really  PRECAUTIONS: Fall  RED FLAGS: None   WEIGHT BEARING RESTRICTIONS: No  FALLS: Has patient fallen in last 6 months? No  LIVING ENVIRONMENT: Lives with: lives with an adult companion Lives in: House/apartment Stairs: Yes: Internal: 18 steps; on left going up Has following equipment at home: Single point cane and Walker - 4 wheeled  PLOF: Independent, Independent with basic ADLs, and Independent with gait  PATIENT GOALS: to get better than what I am   OBJECTIVE:  Note: Objective measures  were completed at Evaluation unless otherwise noted.  DIAGNOSTIC FINDINGS:  IMPRESSION: 1. 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 2. Mild stenosis of the proximal basilar artery. 3. Moderate stenosis of the right anterior cerebral artery A2 segment.  MPRESSION: 1. Small acute infarct within the right pons in the region of the paramedian pontine reticular formation, likely cause of diplopia. 2. 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 3. Multifocal hyperintense T2-weighted signal within the cerebral white matter, most commonly due to chronic small vessel disease. 4. Normal MRI of the orbits  COGNITION: Overall cognitive status: Within functional limits for tasks assessed   SENSATION: WFL Hot/Cold: Impaired in LUE  COORDINATION: Decreased coordination   MUSCLE LENGTH: Hamstrings: very tight in HS  POSTURE: rounded shoulders, decreased lumbar lordosis, and R shoulder is lowered   LOWER EXTREMITY ROM:   WFL    LOWER EXTREMITY MMT:  5/5 BLE   GAIT: Findings: Gait Characteristics: decreased arm swing- Right, decreased step length- Left, decreased stance time- Right, decreased stride length, decreased hip/knee flexion- Right, ataxic, antalgic, wide BOS, and poor foot clearance- Right, Distance walked: in clinic distances, and Comments: antalgic gait that he reports has been like this for almost 2 years and says it is due to spinal stenosis and pain in back of legs   FUNCTIONAL TESTS:  5 times sit to stand: 10s Timed up and go (TUG): 16s  TREATMENT DATE:  01/18/24  Nustep L5x8 minutes all four extremities seat 7  Piriformis stretches 2x30 seconds B hooklying  HS stretch with strap 2x30 seconds B Bridges into green TB x15  PPT 10x3 second holds Sidelying hip ABD x10 B no resistance Fitter pushes blue band x15 B On blue  air pad: TA sets 10x3 second holds, TA set + march x20 alternating, lateral trunk crunches alternating x20     01/09/24  Scifit bike L4x8 minutes seat 8 (nustep not available)  Bridges + ABD into green TB 2x10  Sidelying clams green TB x12 B Piriformis stretch 2x30 seconds B HS stretch with strap 1x30 seconds B STS green TB above knees x10 Standing hip ABD green TB  x10 B  QL stretch pushing stool forward seated 4x30 seconds   Tandem stance solid surface 2x30 seconds B  Narrow BOS x60 seconds blue foam pad        PATIENT EDUCATION: Education details: POC, HEP, fall risk,  Person educated: Patient Education method: Explanation Education comprehension: verbalized understanding  HOME EXERCISE PROGRAM: Access Code: [email or web address] URL: [email or web address] Date: 12/05/2023 Prepared by: Almetta Fam  Exercises - Sit to Stand  - 1 x daily - 7 x weekly - 2 sets - 10 reps - Seated Hamstring Stretch  - 1 x daily - 7 x weekly - 3 reps - 15 hold - Standing Hip Abduction with Counter Support  - 1 x daily - 7 x weekly - 2 sets - 10 reps - Supine Bridge  - 1 x daily - 7 x weekly - 2 sets - 10 reps  GOALS: Goals reviewed with patient? Yes  SHORT TERM GOALS: Target date: 01/09/24  Patient will be independent with initial HEP.  Baseline:  Goal status: MET 01/09/24  2.  Patient will complete TUG < 13s  Baseline: 16 Goal status: MET 12.58s 01/04/24   LONG TERM GOALS: Target date: 02/13/24  Patient will be independent with advanced/ongoing HEP to improve outcomes and carryover.  Baseline:  Goal status: INITIAL  2.  Patient will report 50% improvement in low back and hamstrings to improve QOL.  Baseline: 8/10 with activity  Goal status: INITIAL  3.  Patient will improve BERG to 50/56 to decrease risk for falls.  Baseline: 42/56 Goal status: INITIAL  4.  Patient will be able to walk 100-273ft with normalized gait pattern.    Baseline: limping on RLE Goal status:  INITIAL  5.  Patient will tolerate 15-20 min of standing/sitting/walking. Baseline: < 5 mins  Goal status: INITIAL   ASSESSMENT:  CLINICAL IMPRESSION:  Arrives today doing OK, seemed quite a bit more unsteady and had his SPC with him this afternoon. Almost tripped on another pt who was standing blocking part of the walkway in waiting room. He was a bit sore after last time, progressed all activities but focused on mat work today due to increased pain and unsteadiness in standing today. Will continue efforts.   OBJECTIVE IMPAIRMENTS: Abnormal gait, decreased balance, decreased endurance, difficulty walking, increased muscle spasms, improper body mechanics, postural dysfunction, and pain.   ACTIVITY LIMITATIONS: carrying, lifting, bending, standing, squatting, stairs, and locomotion level  PARTICIPATION LIMITATIONS: cleaning, laundry, shopping, community activity, occupation, and yard work  PERSONAL FACTORS: Education, Fitness, Past/current experiences, and 1-2 comorbidities: chronic pain, impaired gait, neuropathic pain, DM, Stroke are also affecting patient's functional outcome.   REHAB POTENTIAL: Good  CLINICAL DECISION MAKING: Evolving/moderate complexity  EVALUATION COMPLEXITY: Moderate  PLAN:  PT FREQUENCY: 1-2x/week  PT  DURATION: 10 weeks  PLANNED INTERVENTIONS: 97110-Therapeutic exercises, 97530- Therapeutic activity, [id number]- Neuromuscular re-education, [phone]- Self Care, [id number]- Manual therapy, [phone]- Gait training, [phone]- Electrical stimulation (unattended), 20560 (1-2 muscles), [phone] (3+ muscles)- Dry Needling, Patient/Family education, Balance training, Stair training, Taping, Joint mobilization, Joint manipulation, Spinal manipulation, Spinal mobilization, Cryotherapy, and Moist heat  PLAN FOR NEXT SESSION: balance, gait training, LE stretching and strengthening. Encourage LRAD to reduce fall risk, high fall risk    Josette Rough, PT, DPT 01/18/24 3:11 PM

## 2024-01-20 ENCOUNTER — Telehealth: Payer: Self-pay | Admitting: Pharmacist Clinician (PhC)/ Clinical Pharmacy Specialist

## 2024-01-20 ENCOUNTER — Other Ambulatory Visit (HOSPITAL_COMMUNITY): Payer: Self-pay

## 2024-01-20 DIAGNOSIS — E785 Hyperlipidemia, unspecified: Secondary | ICD-10-CM

## 2024-01-20 MED ORDER — EZETIMIBE 10 MG PO TABS
10.0000 mg | ORAL_TABLET | Freq: Every day | ORAL | 3 refills | Status: DC
  Filled 2024-01-20: qty 90, 90d supply, fill #0

## 2024-01-20 NOTE — Telephone Encounter (Signed)
Followup set.

## 2024-01-20 NOTE — Addendum Note (Signed)
 Addended by: Celenia Hruska L on: 01/20/2024 02:31 PM   Modules accepted: Orders

## 2024-01-24 ENCOUNTER — Ambulatory Visit (INDEPENDENT_AMBULATORY_CARE_PROVIDER_SITE_OTHER)

## 2024-01-24 ENCOUNTER — Ambulatory Visit (HOSPITAL_COMMUNITY)
Admission: EM | Admit: 2024-01-24 | Discharge: 2024-01-24 | Disposition: A | Discharge status: Home / Self Care | Attending: Emergency Medicine | Admitting: Emergency Medicine

## 2024-01-24 ENCOUNTER — Encounter (HOSPITAL_COMMUNITY): Payer: Self-pay

## 2024-01-24 DIAGNOSIS — S61203A Unspecified open wound of left middle finger without damage to nail, initial encounter: Secondary | ICD-10-CM | POA: Diagnosis not present

## 2024-01-24 DIAGNOSIS — S61209D Unspecified open wound of unspecified finger without damage to nail, subsequent encounter: Secondary | ICD-10-CM

## 2024-01-24 MED ORDER — MUPIROCIN 2 % EX OINT
1.0000 | TOPICAL_OINTMENT | Freq: Two times a day (BID) | CUTANEOUS | 0 refills | Status: AC
  Filled 2024-01-24: qty 22, 11d supply, fill #0

## 2024-01-24 MED ORDER — DOXYCYCLINE HYCLATE 100 MG PO TABS
100.0000 mg | ORAL_TABLET | Freq: Two times a day (BID) | ORAL | 0 refills | Status: AC
  Filled 2024-01-24: qty 14, 7d supply, fill #0

## 2024-01-24 NOTE — ED Triage Notes (Signed)
 Pt states seen here 2wks ago about a cut to tip of lt middle finger and states its still bleeding at times. States he is on blood thinners and is a diabetic. Denies known injury.

## 2024-01-24 NOTE — Therapy (Signed)
 OUTPATIENT PHYSICAL THERAPY NEURO TREATMENT   Patient Name: Peter Bell MRN: [id number] DOB:1971/04/16, 53 y.o., male Today's Date: 01/24/2024   PCP: Thersia Stark REFERRING PROVIDER: Lonni Dalton  END OF SESSION:        Past Medical History:  Diagnosis Date   Asthma    Diabetes mellitus without complication (HCC)    Eczema    GERD (gastroesophageal reflux disease)    Hypertension    Myocardial infarction [facility])    Sciatica    Past Surgical History:  Procedure Laterality Date   CORONARY STENT INTERVENTION N/A 09/26/2023   Procedure: CORONARY STENT INTERVENTION;  Surgeon: Swaziland, [name], MD;  Location: [facility] INVASIVE CV LAB;  Service: Cardiovascular;  Laterality: N/A;   HERNIA REPAIR     LEFT HEART CATH AND CORONARY ANGIOGRAPHY N/A 09/26/2023   Procedure: LEFT HEART CATH AND CORONARY ANGIOGRAPHY;  Surgeon: Swaziland, [name], MD;  Location: [facility] INVASIVE CV LAB;  Service: Cardiovascular;  Laterality: N/A;   Patient Active Problem List   Diagnosis Date Noted   Hyperlipidemia LDL goal <55 01/11/2024   CAD S/P percutaneous coronary angioplasty 11/15/2023   Stroke (HCC) 11/14/2023   Chronic low back pain 10/26/2023   NSTEMI (non-ST elevated myocardial infarction) (HCC) 09/25/2023   Impaired gait 03/31/2023   Type 2 diabetes mellitus with hyperglycemia, without long-term current use of insulin  (HCC) 03/31/2023   Neuropathic pain 03/31/2023   Spinal stenosis of lumbar region 04/22/2020   Acute pain of left knee 09/17/2019   Protrusion of lumbar intervertebral disc 05/29/2019   SOB (shortness of breath) 05/10/2013   Essential hypertension, benign 04/24/2013   Chest pain, unspecified 04/24/2013    ONSET DATE: 11/14/23  REFERRING DIAG:  I63.9 (ICD-10-CM) - Acute ischemic stroke (HCC)    THERAPY DIAG:  No diagnosis found.  Rationale for Evaluation and Treatment: Rehabilitation  SUBJECTIVE:                                                                                                                                                                                              SUBJECTIVE STATEMENT:  Nothing new since last time   PERTINENT HISTORY: Peter Bell is a 53 y.o. male with medical history significant of DM2, CAD spinal stenosis   Presented with change in sensation left arm Hx of DM2, CAD, HTN. Here with 4 days of trouble swallowing, numbness of left hand and arm, double vision. MRI showing acute CVA in the pons Unable to detect on cold or hot in his left hand and arm for past 4 days. No weakness in the left arm no change in sensation or  strength in his left leg. Did feel that his speech was slurred  PAIN:  Are you having pain? Yes: NPRS scale: 8-9/10 when standing or walking, no pain when sitting Pain location: legs  Pain description: feelings like charlie horses in my hamstrings all the time  Aggravating factors: walking and standing  Relieving factors: nothing really  PRECAUTIONS: Fall  RED FLAGS: None   WEIGHT BEARING RESTRICTIONS: No  FALLS: Has patient fallen in last 6 months? No  LIVING ENVIRONMENT: Lives with: lives with an adult companion Lives in: House/apartment Stairs: Yes: Internal: 18 steps; on left going up Has following equipment at home: Single point cane and Walker - 4 wheeled  PLOF: Independent, Independent with basic ADLs, and Independent with gait  PATIENT GOALS: to get better than what I am   OBJECTIVE:  Note: Objective measures were completed at Evaluation unless otherwise noted.  DIAGNOSTIC FINDINGS:  IMPRESSION: 1. 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 2. Mild stenosis of the proximal basilar artery. 3. Moderate stenosis of the right anterior cerebral artery A2 segment.  MPRESSION: 1. Small acute infarct within the right pons in the region of the paramedian pontine reticular formation, likely cause of diplopia. 2. 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 3.  Multifocal hyperintense T2-weighted signal within the cerebral white matter, most commonly due to chronic small vessel disease. 4. Normal MRI of the orbits  COGNITION: Overall cognitive status: Within functional limits for tasks assessed   SENSATION: WFL Hot/Cold: Impaired in LUE  COORDINATION: Decreased coordination   MUSCLE LENGTH: Hamstrings: very tight in HS  POSTURE: rounded shoulders, decreased lumbar lordosis, and R shoulder is lowered   LOWER EXTREMITY ROM:   WFL    LOWER EXTREMITY MMT:  5/5 BLE   GAIT: Findings: Gait Characteristics: decreased arm swing- Right, decreased step length- Left, decreased stance time- Right, decreased stride length, decreased hip/knee flexion- Right, ataxic, antalgic, wide BOS, and poor foot clearance- Right, Distance walked: in clinic distances, and Comments: antalgic gait that he reports has been like this for almost 2 years and says it is due to spinal stenosis and pain in back of legs   FUNCTIONAL TESTS:  5 times sit to stand: 10s Timed up and go (TUG): 16s                                                                                                                              TREATMENT DATE: 01/25/24 Laurene Pander with ball squeeze HS stretch with strap 30s x2 B Side steps on airex Step ups Shoulder ext  Leg press  STS with shoulder flexion WaTe  01/18/24  Nustep L5x8 minutes all four extremities seat 7  Piriformis stretches 2x30 seconds B hooklying  HS stretch with strap 2x30 seconds B Bridges into green TB x15  PPT 10x3 second holds Sidelying hip ABD x10 B no resistance Fitter pushes blue band x15 B On blue air pad: TA sets 10x3  second holds, TA set + march x20 alternating, lateral trunk crunches alternating x20     01/09/24  Scifit bike L4x8 minutes seat 8 (nustep not available)  Bridges + ABD into green TB 2x10  Sidelying clams green TB x12 B Piriformis stretch 2x30 seconds B HS stretch with strap 1x30  seconds B STS green TB above knees x10 Standing hip ABD green TB  x10 B  QL stretch pushing stool forward seated 4x30 seconds   Tandem stance solid surface 2x30 seconds B  Narrow BOS x60 seconds blue foam pad        PATIENT EDUCATION: Education details: POC, HEP, fall risk,  Person educated: Patient Education method: Explanation Education comprehension: verbalized understanding  HOME EXERCISE PROGRAM: Access Code: [email or web address] URL: [email or web address] Date: 12/05/2023 Prepared by: Almetta Fam  Exercises - Sit to Stand  - 1 x daily - 7 x weekly - 2 sets - 10 reps - Seated Hamstring Stretch  - 1 x daily - 7 x weekly - 3 reps - 15 hold - Standing Hip Abduction with Counter Support  - 1 x daily - 7 x weekly - 2 sets - 10 reps - Supine Bridge  - 1 x daily - 7 x weekly - 2 sets - 10 reps  GOALS: Goals reviewed with patient? Yes  SHORT TERM GOALS: Target date: 01/09/24  Patient will be independent with initial HEP.  Baseline:  Goal status: MET 01/09/24  2.  Patient will complete TUG < 13s  Baseline: 16 Goal status: MET 12.58s 01/04/24   LONG TERM GOALS: Target date: 02/13/24  Patient will be independent with advanced/ongoing HEP to improve outcomes and carryover.  Baseline:  Goal status: INITIAL  2.  Patient will report 50% improvement in low back and hamstrings to improve QOL.  Baseline: 8/10 with activity  Goal status: INITIAL  3.  Patient will improve BERG to 50/56 to decrease risk for falls.  Baseline: 42/56 Goal status: INITIAL  4.  Patient will be able to walk 100-245ft with normalized gait pattern.    Baseline: limping on RLE Goal status: INITIAL  5.  Patient will tolerate 15-20 min of standing/sitting/walking. Baseline: < 5 mins  Goal status: INITIAL   ASSESSMENT:  CLINICAL IMPRESSION:  Arrives today doing OK, seemed quite a bit more unsteady and had his SPC with him this afternoon. Almost tripped on another pt who was standing  blocking part of the walkway in waiting room. He was a bit sore after last time, progressed all activities but focused on mat work today due to increased pain and unsteadiness in standing today. Will continue efforts.   OBJECTIVE IMPAIRMENTS: Abnormal gait, decreased balance, decreased endurance, difficulty walking, increased muscle spasms, improper body mechanics, postural dysfunction, and pain.   ACTIVITY LIMITATIONS: carrying, lifting, bending, standing, squatting, stairs, and locomotion level  PARTICIPATION LIMITATIONS: cleaning, laundry, shopping, community activity, occupation, and yard work  PERSONAL FACTORS: Education, Fitness, Past/current experiences, and 1-2 comorbidities: chronic pain, impaired gait, neuropathic pain, DM, Stroke are also affecting patient's functional outcome.   REHAB POTENTIAL: Good  CLINICAL DECISION MAKING: Evolving/moderate complexity  EVALUATION COMPLEXITY: Moderate  PLAN:  PT FREQUENCY: 1-2x/week  PT DURATION: 10 weeks  PLANNED INTERVENTIONS: 97110-Therapeutic exercises, 97530- Therapeutic activity, 97112- Neuromuscular re-education, 97535- Self Care, 02859- Manual therapy, [phone]- Gait training, [phone]- Electrical stimulation (unattended), [phone] (1-2 muscles), [phone] (3+ muscles)- Dry Needling, Patient/Family education, Balance training, Stair training, Taping, Joint mobilization, Joint manipulation, Spinal manipulation, Spinal mobilization, Cryotherapy, and Moist heat  PLAN FOR NEXT  SESSION: balance, gait training, LE stretching and strengthening. Encourage LRAD to reduce fall risk, high fall risk    Josette Rough, PT, DPT 01/24/24 3:04 PM

## 2024-01-24 NOTE — Discharge Instructions (Signed)
 I did not see a retained foreign body on your imaging.  Take the oral antibiotics twice daily with food for the next 7 days to help cover for infection.  You can also soak the finger in warm water and Epsom salt or Dial soap twice daily to clean it.  Afterwards pat dry and apply the Bactroban.  The area at the base is firm, this may be scarring, could potentially be a wart.  Please follow-up with your primary care provider after completing treatment for reevaluation.  Return to clinic for new or urgent symptoms.

## 2024-01-24 NOTE — ED Provider Notes (Signed)
 [facility]    CSN: [id number] Arrival date & time: 01/24/24  1710      History   Chief Complaint Chief Complaint  Patient presents with   Finger Injury    HPI Peter Bell is a 53 y.o. male.   Patient presents to clinic for ongoing open lesion to the pad of the middle finger. He was seen her over a month ago for the same thing.  Was told to use topical antibacterial ointment, this was not sent to his pharmacy so he was unable to start this medication.  Initially thought the area was healing but then it seemed to open back up. Is unsure if he has something in the wound, was working on a car when he noticed the bleeding. He is DM2 and on anticoagulation.   Has not had warmth, swelling or purulent drainage.  3 weeks ago A1C was 8.1.  The history is provided by the patient and medical records.    Past Medical History:  Diagnosis Date   Asthma    Diabetes mellitus without complication (HCC)    Eczema    GERD (gastroesophageal reflux disease)    Hypertension    Myocardial infarction [facility])    Sciatica     Patient Active Problem List   Diagnosis Date Noted   Hyperlipidemia LDL goal <55 01/11/2024   CAD S/P percutaneous coronary angioplasty 11/15/2023   Stroke (HCC) 11/14/2023   Chronic low back pain 10/26/2023   NSTEMI (non-ST elevated myocardial infarction) (HCC) 09/25/2023   Impaired gait 03/31/2023   Type 2 diabetes mellitus with hyperglycemia, without long-term current use of insulin  (HCC) 03/31/2023   Neuropathic pain 03/31/2023   Spinal stenosis of lumbar region 04/22/2020   Acute pain of left knee 09/17/2019   Protrusion of lumbar intervertebral disc 05/29/2019   SOB (shortness of breath) 05/10/2013   Essential hypertension, benign 04/24/2013   Chest pain, unspecified 04/24/2013    Past Surgical History:  Procedure Laterality Date   CORONARY STENT INTERVENTION N/A 09/26/2023   Procedure: CORONARY STENT INTERVENTION;  Surgeon: Swaziland, [name],  MD;  Location: [facility] INVASIVE CV LAB;  Service: Cardiovascular;  Laterality: N/A;   HERNIA REPAIR     LEFT HEART CATH AND CORONARY ANGIOGRAPHY N/A 09/26/2023   Procedure: LEFT HEART CATH AND CORONARY ANGIOGRAPHY;  Surgeon: Swaziland, [name], MD;  Location: [facility] INVASIVE CV LAB;  Service: Cardiovascular;  Laterality: N/A;       Home Medications    Prior to Admission medications   Medication Sig Start Date End Date Taking? Authorizing Provider  doxycycline (VIBRA-TABS) 100 MG tablet Take 1 tablet (100 mg total) by mouth 2 (two) times daily for 7 days. 01/24/24 01/31/24 Yes Shandale Malak  N, FNP  mupirocin ointment (BACTROBAN) 2 % Apply 1 Application topically 2 (two) times daily. 01/24/24  Yes Sayed Apostol  N, FNP  amLODipine  (NORVASC ) 10 MG tablet Take 1 tablet (10 mg total) by mouth daily. 11/21/23   Caudle, Thersia Bitters, FNP  aspirin  EC 81 MG tablet Take 1 tablet (81 mg total) by mouth daily. Swallow whole. 09/28/23   Adams, Zane, PA-C  atorvastatin  (LIPITOR) 40 MG tablet Take 1 tablet (40 mg total) by mouth daily. 01/11/24 04/10/24  Shlomo Wilbert SAUNDERS, MD  clopidogrel  (PLAVIX ) 75 MG tablet Take 1 tablet (75 mg total) by mouth daily. 10/21/23   Duke, Jon Garre, PA  empagliflozin  (JARDIANCE ) 10 MG TABS tablet Take 1 tablet (10 mg total) by mouth daily. 09/27/23   Adams, Zane,  PA-C  ezetimibe  (ZETIA ) 10 MG tablet Take 1 tablet (10 mg total) by mouth daily. 01/20/24 04/19/24  Shlomo Wilbert SAUNDERS, MD  losartan  (COZAAR ) 100 MG tablet Take 1 tablet (100 mg total) by mouth daily. 11/21/23 02/19/24  Knute Thersia Bitters, FNP  metFORMIN  (GLUCOPHAGE ) 1000 MG tablet Take 1 tablet (1,000 mg total) by mouth 2 (two) times daily with a meal. 01/02/24   Caudle, Thersia Bitters, FNP  metoprolol  succinate (TOPROL  XL) 25 MG 24 hr tablet Take 1 tablet (25 mg total) by mouth daily. 10/21/23   Duke, Jon Garre, PA  nitroGLYCERIN  (NITROSTAT ) 0.4 MG SL tablet Place 1 tablet (0.4 mg total) under the tongue every 5 (five) minutes x 3  doses as needed for chest pain. 09/27/23   Adams, Zane, PA-C  pantoprazole  (PROTONIX ) 20 MG tablet Take 1 tablet (20 mg total) by mouth daily. 11/11/23   Vonna Sharlet POUR, MD  rOPINIRole  (REQUIP ) 0.5 MG tablet Take 1-2 tablets (0.5-1 mg total) by mouth 1 to 3 hours before bedtime. 12/29/23     insulin  glargine (LANTUS) 100 UNIT/ML injection Inject 16 Units into the skin daily.  04/14/19  [provider]  pravastatin (PRAVACHOL) 40 MG tablet Take 40 mg by mouth daily.  04/14/19  [provider]    Family History Family History  Problem Relation Age of Onset   Cancer Mother    Cancer Father    Hypertension Sister    Heart murmur Sister    CVA Brother    CVA Brother    Hypertension Brother     Social History Social History   Tobacco Use   Smoking status: Never    Passive exposure: Never   Smokeless tobacco: Never  Vaping Use   Vaping status: Never Used  Substance Use Topics   Alcohol use: Not Currently   Drug use: No     Allergies   Patient has no known allergies.   Review of Systems Review of Systems  Per HPI  Physical Exam Triage Vital Signs ED Triage Vitals  Encounter Vitals Group     BP 01/24/24 1737 (!) 160/97     Girls Systolic BP Percentile --      Girls Diastolic BP Percentile --      Boys Systolic BP Percentile --      Boys Diastolic BP Percentile --      Pulse Rate 01/24/24 1737 75     Resp 01/24/24 1737 18     Temp 01/24/24 1737 97.8 F (36.6 C)     Temp Source 01/24/24 1737 Oral     SpO2 01/24/24 1737 98 %     Weight --      Height --      Head Circumference --      Peak Flow --      Pain Score 01/24/24 1735 0     Pain Loc --      Pain Education --      Exclude from Growth Chart --    No data found.  Updated Vital Signs BP (!) 160/97 (BP Location: Left Arm)   Pulse 75   Temp 97.8 F (36.6 C) (Oral)   Resp 18   SpO2 98%   Visual Acuity Right Eye Distance:   Left Eye Distance:   Bilateral Distance:    Right Eye  Near:   Left Eye Near:    Bilateral Near:     Physical Exam Vitals and nursing note reviewed.  Constitutional:  Appearance: Normal appearance.  HENT:     Head: Normocephalic and atraumatic.     Left Ear: External ear normal.     Nose: Nose normal.     Mouth/Throat:     Mouth: Mucous membranes are moist.  Eyes:     Conjunctiva/sclera: Conjunctivae normal.  Cardiovascular:     Rate and Rhythm: Normal rate.  Pulmonary:     Effort: Pulmonary effort is normal. No respiratory distress.  Skin:    General: Skin is warm and dry.     Findings: Wound present.      Neurological:     General: No focal deficit present.     Mental Status: He is alert and oriented to person, place, and time.  Psychiatric:        Mood and Affect: Mood normal.        Behavior: Behavior normal. Behavior is cooperative.      UC Treatments / Results  Labs (all labs ordered are listed, but only abnormal results are displayed) Labs Reviewed - No data to display  EKG   Radiology No results found.  Procedures Procedures (including critical care time)  Medications Ordered in UC Medications - No data to display  Initial Impression / Assessment and Plan / UC Course  I have reviewed the triage vital signs and the nursing notes.  Pertinent labs & imaging results that were available during my care of the patient were reviewed by me and considered in my medical decision making (see chart for details).  Vitals and triage reviewed, patient is hemodynamically stable.  Scab to the finger pad of the middle finger.  Area soaked and cleaned.  Imaging by my interpretation does not show acute foreign body.  Base does appear to be firm, differentials include scarring, does check blood glucose every other day or so.  The other differential is HPV versus infection.  Will cover with doxycycline and topical antibiotic ointment due to prolonged duration and to cover for infection, impaired healing due to type 2  diabetes.  PCP follow-up encouraged.  Plan of care, follow-up care return precautions given, no questions at this time.    Final Clinical Impressions(s) / UC Diagnoses   Final diagnoses:  Open wound of finger without damage to nail, subsequent encounter     Discharge Instructions      I did not see a retained foreign body on your imaging.  Take the oral antibiotics twice daily with food for the next 7 days to help cover for infection.  You can also soak the finger in warm water and Epsom salt or Dial soap twice daily to clean it.  Afterwards pat dry and apply the Bactroban.  The area at the base is firm, this may be scarring, could potentially be a wart.  Please follow-up with your primary care provider after completing treatment for reevaluation.  Return to clinic for new or urgent symptoms.      ED Prescriptions     Medication Sig Dispense Auth. Provider   mupirocin ointment (BACTROBAN) 2 % Apply 1 Application topically 2 (two) times daily. 22 g Dreama, Decklyn Hornik  N, FNP   doxycycline (VIBRA-TABS) 100 MG tablet Take 1 tablet (100 mg total) by mouth 2 (two) times daily for 7 days. 14 tablet Dreama, Dalynn Jhaveri  N, FNP      PDMP not reviewed this encounter.   Dreama Mitzie SAILOR, FNP 01/24/24 1900

## 2024-01-25 ENCOUNTER — Other Ambulatory Visit (HOSPITAL_COMMUNITY): Payer: Self-pay

## 2024-01-25 ENCOUNTER — Ambulatory Visit: Attending: Family Medicine

## 2024-01-25 DIAGNOSIS — R278 Other lack of coordination: Secondary | ICD-10-CM | POA: Insufficient documentation

## 2024-01-25 DIAGNOSIS — I639 Cerebral infarction, unspecified: Secondary | ICD-10-CM | POA: Insufficient documentation

## 2024-01-25 DIAGNOSIS — M79661 Pain in right lower leg: Secondary | ICD-10-CM | POA: Insufficient documentation

## 2024-01-25 DIAGNOSIS — M79662 Pain in left lower leg: Secondary | ICD-10-CM | POA: Diagnosis not present

## 2024-01-25 DIAGNOSIS — M5459 Other low back pain: Secondary | ICD-10-CM | POA: Insufficient documentation

## 2024-01-25 DIAGNOSIS — R41840 Attention and concentration deficit: Secondary | ICD-10-CM | POA: Insufficient documentation

## 2024-01-25 DIAGNOSIS — M48061 Spinal stenosis, lumbar region without neurogenic claudication: Secondary | ICD-10-CM | POA: Insufficient documentation

## 2024-01-25 DIAGNOSIS — R2689 Other abnormalities of gait and mobility: Secondary | ICD-10-CM | POA: Diagnosis not present

## 2024-01-25 DIAGNOSIS — R41844 Frontal lobe and executive function deficit: Secondary | ICD-10-CM | POA: Insufficient documentation

## 2024-01-25 DIAGNOSIS — R41842 Visuospatial deficit: Secondary | ICD-10-CM | POA: Insufficient documentation

## 2024-01-26 ENCOUNTER — Telehealth: Payer: Self-pay

## 2024-01-26 ENCOUNTER — Encounter: Admitting: Physical Medicine & Rehabilitation

## 2024-01-26 ENCOUNTER — Other Ambulatory Visit (HOSPITAL_COMMUNITY): Payer: Self-pay

## 2024-01-26 DIAGNOSIS — G603 Idiopathic progressive neuropathy: Secondary | ICD-10-CM | POA: Diagnosis not present

## 2024-01-26 DIAGNOSIS — M7912 Myalgia of auxiliary muscles, head and neck: Secondary | ICD-10-CM | POA: Diagnosis not present

## 2024-01-26 DIAGNOSIS — G894 Chronic pain syndrome: Secondary | ICD-10-CM | POA: Diagnosis not present

## 2024-01-26 DIAGNOSIS — G2581 Restless legs syndrome: Secondary | ICD-10-CM | POA: Diagnosis not present

## 2024-01-26 DIAGNOSIS — M7918 Myalgia, other site: Secondary | ICD-10-CM | POA: Diagnosis not present

## 2024-01-26 DIAGNOSIS — G5603 Carpal tunnel syndrome, bilateral upper limbs: Secondary | ICD-10-CM | POA: Diagnosis not present

## 2024-01-26 DIAGNOSIS — I639 Cerebral infarction, unspecified: Secondary | ICD-10-CM | POA: Diagnosis not present

## 2024-01-26 DIAGNOSIS — G3184 Mild cognitive impairment, so stated: Secondary | ICD-10-CM | POA: Diagnosis not present

## 2024-01-26 MED ORDER — DULOXETINE HCL 60 MG PO CPEP
ORAL_CAPSULE | ORAL | 0 refills | Status: AC
  Filled 2024-01-26: qty 60, 34d supply, fill #0

## 2024-01-26 NOTE — Telephone Encounter (Signed)
 Good Morning Dr. Shlomo  We have received a surgical clearance request for Peter Bell.  He was diagnosed with an NSTEMI on 09/26/2023 and underwent LHC with PCI to the proximal LAD, first OM, and ostial LCx.  Can you please comment on  guidance on holding Plavix  and aspirin  for upcoming for mouth dental extraction. Please forward you guidance and recommendations to P CV DIV PREOP   Thanks,  Jackee Alberts, NP

## 2024-01-26 NOTE — Telephone Encounter (Signed)
   Pre-operative Risk Assessment    Patient Name: Peter Bell  DOB: 1971-03-04 MRN: [id number]   Date of last office visit: 10/21/23 JON HAILS, PA Date of next office visit: NONE  Request for Surgical Clearance    Procedure:  FULL MOUTH EXTRACTION  Date of Surgery:  Clearance TBD                                Surgeon:  NOT INDICATED Surgeon's Group or Practice Name:  [facility] Phone number:  [phone] Fax number:  [phone]   Type of Clearance Requested:   - Medical  - Pharmacy:  Hold Aspirin  and Clopidogrel  (Plavix )     Type of Anesthesia:  Local    Additional requests/questions:    Signed, Lucie DELENA Ku   01/26/2024, 11:15 AM

## 2024-01-27 ENCOUNTER — Other Ambulatory Visit (HOSPITAL_COMMUNITY): Payer: Self-pay

## 2024-01-27 MED ORDER — TRAMADOL HCL 50 MG PO TABS
50.0000 mg | ORAL_TABLET | Freq: Three times a day (TID) | ORAL | 2 refills | Status: AC | PRN
  Filled 2024-01-27: qty 90, 30d supply, fill #0

## 2024-01-27 NOTE — Therapy (Incomplete)
 OUTPATIENT PHYSICAL THERAPY NEURO TREATMENT   Patient Name: JODI KAPPES MRN: [id number] DOB:[birth year], 53 y.o., male Today's Date: 01/27/2024   PCP: Thersia Stark REFERRING PROVIDER: Lonni Dalton  END OF SESSION:         Past Medical History:  Diagnosis Date   Asthma    Diabetes mellitus without complication (HCC)    Eczema    GERD (gastroesophageal reflux disease)    Hypertension    Myocardial infarction [facility])    Sciatica    Past Surgical History:  Procedure Laterality Date   CORONARY STENT INTERVENTION N/A 09/26/2023   Procedure: CORONARY STENT INTERVENTION;  Surgeon: Swaziland, [name], MD;  Location: [facility] INVASIVE CV LAB;  Service: Cardiovascular;  Laterality: N/A;   HERNIA REPAIR     LEFT HEART CATH AND CORONARY ANGIOGRAPHY N/A 09/26/2023   Procedure: LEFT HEART CATH AND CORONARY ANGIOGRAPHY;  Surgeon: Swaziland, [name], MD;  Location: [facility] INVASIVE CV LAB;  Service: Cardiovascular;  Laterality: N/A;   Patient Active Problem List   Diagnosis Date Noted   Hyperlipidemia LDL goal <55 01/11/2024   CAD S/P percutaneous coronary angioplasty 11/15/2023   Stroke (HCC) 11/14/2023   Chronic low back pain 10/26/2023   NSTEMI (non-ST elevated myocardial infarction) (HCC) 09/25/2023   Impaired gait 03/31/2023   Type 2 diabetes mellitus with hyperglycemia, without long-term current use of insulin  (HCC) 03/31/2023   Neuropathic pain 03/31/2023   Spinal stenosis of lumbar region 04/22/2020   Acute pain of left knee 09/17/2019   Protrusion of lumbar intervertebral disc 05/29/2019   SOB (shortness of breath) 05/10/2013   Essential hypertension, benign 04/24/2013   Chest pain, unspecified 04/24/2013    ONSET DATE: 11/14/23  REFERRING DIAG:  I63.9 (ICD-10-CM) - Acute ischemic stroke (HCC)    THERAPY DIAG:  No diagnosis found.  Rationale for Evaluation and Treatment: Rehabilitation  SUBJECTIVE:                                                                                                                                                                                              SUBJECTIVE STATEMENT:  Making it, nothing new. Going to the spine doctor tomorrow. They did blood work last time I went.   PERTINENT HISTORY: SAVOY SOMERVILLE is a 53 y.o. male with medical history significant of DM2, CAD spinal stenosis   Presented with change in sensation left arm Hx of DM2, CAD, HTN. Here with 4 days of trouble swallowing, numbness of left hand and arm, double vision. MRI showing acute CVA in the pons Unable to detect on cold or hot in his left hand and arm for  past 4 days. No weakness in the left arm no change in sensation or strength in his left leg. Did feel that his speech was slurred  PAIN:  Are you having pain? Yes: NPRS scale: 8-9/10 when standing or walking, no pain when sitting Pain location: legs  Pain description: feelings like charlie horses in my hamstrings all the time  Aggravating factors: walking and standing  Relieving factors: nothing really  PRECAUTIONS: Fall  RED FLAGS: None   WEIGHT BEARING RESTRICTIONS: No  FALLS: Has patient fallen in last 6 months? No  LIVING ENVIRONMENT: Lives with: lives with an adult companion Lives in: House/apartment Stairs: Yes: Internal: 18 steps; on left going up Has following equipment at home: Single point cane and Walker - 4 wheeled  PLOF: Independent, Independent with basic ADLs, and Independent with gait  PATIENT GOALS: to get better than what I am   OBJECTIVE:  Note: Objective measures were completed at Evaluation unless otherwise noted.  DIAGNOSTIC FINDINGS:  IMPRESSION: 1. 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 2. Mild stenosis of the proximal basilar artery. 3. Moderate stenosis of the right anterior cerebral artery A2 segment.  MPRESSION: 1. Small acute infarct within the right pons in the region of the paramedian pontine reticular formation, likely cause  of diplopia. 2. 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 3. Multifocal hyperintense T2-weighted signal within the cerebral white matter, most commonly due to chronic small vessel disease. 4. Normal MRI of the orbits  COGNITION: Overall cognitive status: Within functional limits for tasks assessed   SENSATION: WFL Hot/Cold: Impaired in LUE  COORDINATION: Decreased coordination   MUSCLE LENGTH: Hamstrings: very tight in HS  POSTURE: rounded shoulders, decreased lumbar lordosis, and R shoulder is lowered   LOWER EXTREMITY ROM:   WFL    LOWER EXTREMITY MMT:  5/5 BLE   GAIT: Findings: Gait Characteristics: decreased arm swing- Right, decreased step length- Left, decreased stance time- Right, decreased stride length, decreased hip/knee flexion- Right, ataxic, antalgic, wide BOS, and poor foot clearance- Right, Distance walked: in clinic distances, and Comments: antalgic gait that he reports has been like this for almost 2 years and says it is due to spinal stenosis and pain in back of legs   FUNCTIONAL TESTS:  5 times sit to stand: 10s Timed up and go (TUG): 16s                                                                                                                              TREATMENT DATE: 01/30/24 NuStep Rows and Lats Leg ext HS curls HS stretch Feet on pball rotations, knees to chest, smal bridges Bridge with hip abd  STS   01/25/24 NuStep L5x18mins Bridges with ball squeeze 2x10 HS stretch with strap 30s x2  Side steps on to airex Step ups 6 Shoulder ext 5# 2x10 Leg press 20# 2x10 STS with shoulder flexion WaTe 3# 2x10  01/18/24  Nustep L5x8 minutes  all four extremities seat 7  Piriformis stretches 2x30 seconds B hooklying  HS stretch with strap 2x30 seconds B Bridges into green TB x15  PPT 10x3 second holds Sidelying hip ABD x10 B no resistance Fitter pushes blue band x15 B On blue air pad: TA sets 10x3 second holds, TA set + march x20  alternating, lateral trunk crunches alternating x20     01/09/24  Scifit bike L4x8 minutes seat 8 (nustep not available)  Bridges + ABD into green TB 2x10  Sidelying clams green TB x12 B Piriformis stretch 2x30 seconds B HS stretch with strap 1x30 seconds B STS green TB above knees x10 Standing hip ABD green TB  x10 B  QL stretch pushing stool forward seated 4x30 seconds   Tandem stance solid surface 2x30 seconds B  Narrow BOS x60 seconds blue foam pad        PATIENT EDUCATION: Education details: POC, HEP, fall risk,  Person educated: Patient Education method: Explanation Education comprehension: verbalized understanding  HOME EXERCISE PROGRAM: Access Code: [email or web address] URL: [email or web address] Date: 12/05/2023 Prepared by: Almetta Fam  Exercises - Sit to Stand  - 1 x daily - 7 x weekly - 2 sets - 10 reps - Seated Hamstring Stretch  - 1 x daily - 7 x weekly - 3 reps - 15 hold - Standing Hip Abduction with Counter Support  - 1 x daily - 7 x weekly - 2 sets - 10 reps - Supine Bridge  - 1 x daily - 7 x weekly - 2 sets - 10 reps  GOALS: Goals reviewed with patient? Yes  SHORT TERM GOALS: Target date: 01/09/24  Patient will be independent with initial HEP.  Baseline:  Goal status: MET 01/09/24  2.  Patient will complete TUG < 13s  Baseline: 16 Goal status: MET 12.58s 01/04/24   LONG TERM GOALS: Target date: 02/13/24  Patient will be independent with advanced/ongoing HEP to improve outcomes and carryover.  Baseline:  Goal status: INITIAL  2.  Patient will report 50% improvement in low back and hamstrings to improve QOL.  Baseline: 8/10 with activity  Goal status: INITIAL  3.  Patient will improve BERG to 50/56 to decrease risk for falls.  Baseline: 42/56 Goal status: INITIAL  4.  Patient will be able to walk 100-219ft with normalized gait pattern.    Baseline: limping on RLE Goal status: IN PROGRESS 01/25/24  5.  Patient will tolerate 15-20  min of standing/sitting/walking. Baseline: < 5 mins  Goal status: IN PROGRESS 01/25/24   ASSESSMENT:  CLINICAL IMPRESSION: Arrives today doing OK, seemed quite a bit more unsteady and sometimes his safety awareness is compromised. Almost tripped a few times, has to be clear of obstacles. Some instability with step up on airex and step ups. Will continue efforts.   OBJECTIVE IMPAIRMENTS: Abnormal gait, decreased balance, decreased endurance, difficulty walking, increased muscle spasms, improper body mechanics, postural dysfunction, and pain.   ACTIVITY LIMITATIONS: carrying, lifting, bending, standing, squatting, stairs, and locomotion level  PARTICIPATION LIMITATIONS: cleaning, laundry, shopping, community activity, occupation, and yard work  PERSONAL FACTORS: Education, Fitness, Past/current experiences, and 1-2 comorbidities: chronic pain, impaired gait, neuropathic pain, DM, Stroke are also affecting patient's functional outcome.   REHAB POTENTIAL: Good  CLINICAL DECISION MAKING: Evolving/moderate complexity  EVALUATION COMPLEXITY: Moderate  PLAN:  PT FREQUENCY: 1-2x/week  PT DURATION: 10 weeks  PLANNED INTERVENTIONS: 97110-Therapeutic exercises, 97530- Therapeutic activity, [id number]- Neuromuscular re-education, 97535- Self Care, [id number]- Manual therapy, [id number]- Gait training, [phone]- Electrical stimulation (unattended),  [id number] (1-2 muscles), [id number] (3+ muscles)- Dry Needling, Patient/Family education, Balance training, Stair training, Taping, Joint mobilization, Joint manipulation, Spinal manipulation, Spinal mobilization, Cryotherapy, and Moist heat  PLAN FOR NEXT SESSION: balance, gait training, LE stretching and strengthening. Encourage LRAD to reduce fall risk, high fall risk    Josette Rough, PT, DPT 01/27/24 9:41 AM

## 2024-01-30 ENCOUNTER — Ambulatory Visit (HOSPITAL_BASED_OUTPATIENT_CLINIC_OR_DEPARTMENT_OTHER): Admitting: Family Medicine

## 2024-01-30 ENCOUNTER — Ambulatory Visit

## 2024-01-30 NOTE — Therapy (Signed)
 OUTPATIENT PHYSICAL THERAPY NEURO TREATMENT   Patient Name: Peter Bell MRN: [id number] DOB:08/04/1970, 53 y.o., male Today's Date: 01/31/2024   PCP: Thersia Stark REFERRING PROVIDER: Lonni Dalton  END OF SESSION:  PT End of Session - 01/31/24 1147     Visit Number 7    Date for Recertification  02/13/24    Authorization Type [place] [employer]    PT Start Time 1147    PT Stop Time 1230    PT Time Calculation (min) 43 min    Activity Tolerance Patient tolerated treatment well    Behavior During Therapy [facility] for tasks assessed/performed;Flat affect;Impulsive                Past Medical History:  Diagnosis Date   Asthma    Diabetes mellitus without complication (HCC)    Eczema    GERD (gastroesophageal reflux disease)    Hypertension    Myocardial infarction [facility])    Sciatica    Past Surgical History:  Procedure Laterality Date   CORONARY STENT INTERVENTION N/A 09/26/2023   Procedure: CORONARY STENT INTERVENTION;  Surgeon: Swaziland, [name], MD;  Location: [facility] INVASIVE CV LAB;  Service: Cardiovascular;  Laterality: N/A;   HERNIA REPAIR     LEFT HEART CATH AND CORONARY ANGIOGRAPHY N/A 09/26/2023   Procedure: LEFT HEART CATH AND CORONARY ANGIOGRAPHY;  Surgeon: Swaziland, [name], MD;  Location: [facility] INVASIVE CV LAB;  Service: Cardiovascular;  Laterality: N/A;   Patient Active Problem List   Diagnosis Date Noted   Hyperlipidemia LDL goal <55 01/11/2024   CAD S/P percutaneous coronary angioplasty 11/15/2023   Stroke (HCC) 11/14/2023   Chronic low back pain 10/26/2023   NSTEMI (non-ST elevated myocardial infarction) (HCC) 09/25/2023   Impaired gait 03/31/2023   Type 2 diabetes mellitus with hyperglycemia, without long-term current use of insulin  (HCC) 03/31/2023   Neuropathic pain 03/31/2023   Spinal stenosis of lumbar region 04/22/2020   Acute pain of left knee 09/17/2019   Protrusion of lumbar intervertebral disc 05/29/2019   SOB (shortness of breath)  05/10/2013   Essential hypertension, benign 04/24/2013   Chest pain, unspecified 04/24/2013    ONSET DATE: 11/14/23  REFERRING DIAG:  I63.9 (ICD-10-CM) - Acute ischemic stroke (HCC)    THERAPY DIAG:  Other lack of coordination  Other abnormalities of gait and mobility  Spinal stenosis of lumbar region, unspecified whether neurogenic claudication present  Cerebrovascular accident (CVA), unspecified mechanism (HCC)  Other low back pain  Pain in both lower legs  Rationale for Evaluation and Treatment: Rehabilitation  SUBJECTIVE:  SUBJECTIVE STATEMENT:  Spine doctor gave me a steroid shot. It did not help it is not like it is going to help me walk any better.   PERTINENT HISTORY: Peter Bell is a 53 y.o. male with medical history significant of DM2, CAD spinal stenosis   Presented with change in sensation left arm Hx of DM2, CAD, HTN. Here with 4 days of trouble swallowing, numbness of left hand and arm, double vision. MRI showing acute CVA in the pons Unable to detect on cold or hot in his left hand and arm for past 4 days. No weakness in the left arm no change in sensation or strength in his left leg. Did feel that his speech was slurred  PAIN:  Are you having pain? Yes: NPRS scale: 8-9/10 when standing or walking, no pain when sitting Pain location: legs  Pain description: feelings like charlie horses in my hamstrings all the time  Aggravating factors: walking and standing  Relieving factors: nothing really  PRECAUTIONS: Fall  RED FLAGS: None   WEIGHT BEARING RESTRICTIONS: No  FALLS: Has patient fallen in last 6 months? No  LIVING ENVIRONMENT: Lives with: lives with an adult companion Lives in: House/apartment Stairs: Yes: Internal: 18 steps; on left going up Has  following equipment at home: Single point cane and Walker - 4 wheeled  PLOF: Independent, Independent with basic ADLs, and Independent with gait  PATIENT GOALS: to get better than what I am   OBJECTIVE:  Note: Objective measures were completed at Evaluation unless otherwise noted.  DIAGNOSTIC FINDINGS:  IMPRESSION: 1. 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 2. Mild stenosis of the proximal basilar artery. 3. Moderate stenosis of the right anterior cerebral artery A2 segment.  MPRESSION: 1. Small acute infarct within the right pons in the region of the paramedian pontine reticular formation, likely cause of diplopia. 2. 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 3. Multifocal hyperintense T2-weighted signal within the cerebral white matter, most commonly due to chronic small vessel disease. 4. Normal MRI of the orbits  COGNITION: Overall cognitive status: Within functional limits for tasks assessed   SENSATION: WFL Hot/Cold: Impaired in LUE  COORDINATION: Decreased coordination   MUSCLE LENGTH: Hamstrings: very tight in HS  POSTURE: rounded shoulders, decreased lumbar lordosis, and R shoulder is lowered   LOWER EXTREMITY ROM:   WFL    LOWER EXTREMITY MMT:  5/5 BLE   GAIT: Findings: Gait Characteristics: decreased arm swing- Right, decreased step length- Left, decreased stance time- Right, decreased stride length, decreased hip/knee flexion- Right, ataxic, antalgic, wide BOS, and poor foot clearance- Right, Distance walked: in clinic distances, and Comments: antalgic gait that he reports has been like this for almost 2 years and says it is due to spinal stenosis and pain in back of legs   FUNCTIONAL TESTS:  5 times sit to stand: 10s Timed up and go (TUG): 16s  TREATMENT DATE: 01/31/24 Bike L3 x48mins Rows and Lats 25#  2x10 Leg ext 10# 2x12 HS curls 25# 2x12 STS with OHP 2x10 HS stretch 30s  Feet on pball rotations, knees to chest, small bridges x5 Bridge with hip abd 2x10   01/25/24 NuStep L5x60mins Bridges with ball squeeze 2x10 HS stretch with strap 30s x2  Side steps on to airex Step ups 6 Shoulder ext 5# 2x10 Leg press 20# 2x10 STS with shoulder flexion WaTe 3# 2x10  01/18/24  Nustep L5x8 minutes all four extremities seat 7  Piriformis stretches 2x30 seconds B hooklying  HS stretch with strap 2x30 seconds B Bridges into green TB x15  PPT 10x3 second holds Sidelying hip ABD x10 B no resistance Fitter pushes blue band x15 B On blue air pad: TA sets 10x3 second holds, TA set + march x20 alternating, lateral trunk crunches alternating x20     01/09/24  Scifit bike L4x8 minutes seat 8 (nustep not available)  Bridges + ABD into green TB 2x10  Sidelying clams green TB x12 B Piriformis stretch 2x30 seconds B HS stretch with strap 1x30 seconds B STS green TB above knees x10 Standing hip ABD green TB  x10 B  QL stretch pushing stool forward seated 4x30 seconds   Tandem stance solid surface 2x30 seconds B  Narrow BOS x60 seconds blue foam pad        PATIENT EDUCATION: Education details: POC, HEP, fall risk,  Person educated: Patient Education method: Explanation Education comprehension: verbalized understanding  HOME EXERCISE PROGRAM: Access Code: [email or web address] URL: [email or web address] Date: 12/05/2023 Prepared by: Almetta Fam  Exercises - Sit to Stand  - 1 x daily - 7 x weekly - 2 sets - 10 reps - Seated Hamstring Stretch  - 1 x daily - 7 x weekly - 3 reps - 15 hold - Standing Hip Abduction with Counter Support  - 1 x daily - 7 x weekly - 2 sets - 10 reps - Supine Bridge  - 1 x daily - 7 x weekly - 2 sets - 10 reps  GOALS: Goals reviewed with patient? Yes  SHORT TERM GOALS: Target date: 01/09/24  Patient will be independent with initial HEP.   Baseline:  Goal status: MET 01/09/24  2.  Patient will complete TUG < 13s  Baseline: 16 Goal status: MET 12.58s 01/04/24   LONG TERM GOALS: Target date: 02/13/24  Patient will be independent with advanced/ongoing HEP to improve outcomes and carryover.  Baseline:  Goal status: INITIAL  2.  Patient will report 50% improvement in low back and hamstrings to improve QOL.  Baseline: 8/10 with activity  Goal status: IN PROGRESS eased up some 01/31/24  3.  Patient will improve BERG to 50/56 to decrease risk for falls.  Baseline: 42/56 Goal status: IN PROGRESS 44/56 01/31/24  4.  Patient will be able to walk 100-267ft with normalized gait pattern.    Baseline: limping on RLE Goal status: IN PROGRESS 01/25/24  5.  Patient will tolerate 15-20 min of standing/sitting/walking. Baseline: < 5 mins  Goal status: IN PROGRESS 01/25/24   ASSESSMENT:  CLINICAL IMPRESSION: Arrives today doing OK, seemed quite a bit more unsteady and sometimes his safety awareness is compromised. Almost tripped a few times, has to be clear of obstacles. He still has ongoing pains in his hamstrings and reports some pain in his side lately. Small bridges on ball were very hard for him, able to do 5 reps with some small elevation from table. He is  not doing his exercises as often as he should. Will continue with efforts.   OBJECTIVE IMPAIRMENTS: Abnormal gait, decreased balance, decreased endurance, difficulty walking, increased muscle spasms, improper body mechanics, postural dysfunction, and pain.   ACTIVITY LIMITATIONS: carrying, lifting, bending, standing, squatting, stairs, and locomotion level  PARTICIPATION LIMITATIONS: cleaning, laundry, shopping, community activity, occupation, and yard work  PERSONAL FACTORS: Education, Fitness, Past/current experiences, and 1-2 comorbidities: chronic pain, impaired gait, neuropathic pain, DM, Stroke are also affecting patient's functional outcome.   REHAB POTENTIAL:  Good  CLINICAL DECISION MAKING: Evolving/moderate complexity  EVALUATION COMPLEXITY: Moderate  PLAN:  PT FREQUENCY: 1-2x/week  PT DURATION: 10 weeks  PLANNED INTERVENTIONS: 97110-Therapeutic exercises, 97530- Therapeutic activity, [id number]- Neuromuscular re-education, 97535- Self Care, [id number]- Manual therapy, [phone]- Gait training, [phone]- Electrical stimulation (unattended), 20560 (1-2 muscles), 20561 (3+ muscles)- Dry Needling, Patient/Family education, Balance training, Stair training, Taping, Joint mobilization, Joint manipulation, Spinal manipulation, Spinal mobilization, Cryotherapy, and Moist heat  PLAN FOR NEXT SESSION: balance, gait training, LE stretching and strengthening. Encourage LRAD to reduce fall risk, high fall risk    Josette Rough, PT, DPT 01/31/24 12:29 PM

## 2024-01-30 NOTE — Telephone Encounter (Signed)
   Name: Peter Bell  DOB: 1971-04-08  MRN: [id number]   Primary Cardiologist: Wilbert Bihari, MD  Chart reviewed as part of pre-operative protocol coverage.   Per Dr. Swaziland, I would recommend a minimum of 6 months before holding Plavix  and would not hold ASA. Therefore, patient will not be able to hold Plavix  until after 03/27/2024.   I will route this recommendation to the requesting party via Epic fax function and remove from pre-op pool. Please call with questions.  Barnie Hila, NP 01/30/2024, 1:20 PM

## 2024-01-31 ENCOUNTER — Ambulatory Visit

## 2024-01-31 ENCOUNTER — Other Ambulatory Visit (HOSPITAL_COMMUNITY): Payer: Self-pay

## 2024-01-31 DIAGNOSIS — I639 Cerebral infarction, unspecified: Secondary | ICD-10-CM

## 2024-01-31 DIAGNOSIS — M79661 Pain in right lower leg: Secondary | ICD-10-CM

## 2024-01-31 DIAGNOSIS — M5459 Other low back pain: Secondary | ICD-10-CM | POA: Diagnosis not present

## 2024-01-31 DIAGNOSIS — R278 Other lack of coordination: Secondary | ICD-10-CM

## 2024-01-31 DIAGNOSIS — R2689 Other abnormalities of gait and mobility: Secondary | ICD-10-CM | POA: Diagnosis not present

## 2024-01-31 DIAGNOSIS — M79662 Pain in left lower leg: Secondary | ICD-10-CM | POA: Diagnosis not present

## 2024-01-31 DIAGNOSIS — R41840 Attention and concentration deficit: Secondary | ICD-10-CM | POA: Diagnosis not present

## 2024-01-31 DIAGNOSIS — M48061 Spinal stenosis, lumbar region without neurogenic claudication: Secondary | ICD-10-CM | POA: Diagnosis not present

## 2024-01-31 DIAGNOSIS — R41842 Visuospatial deficit: Secondary | ICD-10-CM | POA: Diagnosis not present

## 2024-01-31 DIAGNOSIS — R41844 Frontal lobe and executive function deficit: Secondary | ICD-10-CM | POA: Diagnosis not present

## 2024-01-31 NOTE — Progress Notes (Unsigned)
 Ellouise Console, PA-C [address] [place], [place]  [address] Phone: [phone]   Gastroenterology Consultation  Referring Provider:     Knute Thersia Bitters, * Primary Care Physician:  Knute Thersia Bitters, FNP Primary Gastroenterologist:  Ellouise Console, PA-C / Norleen Kiang, MD  Reason for Consultation:     Discuss screening colonoscopy        HPI:   Peter Bell is a 53 y.o. y/o male referred for consultation & management  by Knute Thersia Bitters, FNP.    Patient is referred to schedule first screening colonoscopy due to age.  He has never had a colonoscopy.  No current GI symptoms.  Specifically, he denies abdominal pain, heartburn, dysphagia, bowel irregularities, or rectal bleeding.  No known family history of colon cancer.  Both his mother and aunt had lung cancer.  11/16/2023 labs showed normal hemoglobin 13.0.  No anemia.  PMH: CAD, hypertension, NSTEMI with cardiac stent placed 09/25/2023, acute ischemic stroke 11/14/2023, type 2 diabetes, hyperlipidemia, chronic low back pain.  Currently on aspirin  and Plavix .  10/2023 echo LVEF 55 to 60%.  Past Medical History:  Diagnosis Date   Asthma    Diabetes mellitus without complication (HCC)    Eczema    GERD (gastroesophageal reflux disease)    Hypertension    Myocardial infarction (HCC)    Sciatica    Stroke [facility])     Past Surgical History:  Procedure Laterality Date   CORONARY STENT INTERVENTION N/A 09/26/2023   Procedure: CORONARY STENT INTERVENTION;  Surgeon: Swaziland, [name], MD;  Location: [facility] INVASIVE CV LAB;  Service: Cardiovascular;  Laterality: N/A;   HERNIA REPAIR     LEFT HEART CATH AND CORONARY ANGIOGRAPHY N/A 09/26/2023   Procedure: LEFT HEART CATH AND CORONARY ANGIOGRAPHY;  Surgeon: Swaziland, [name], MD;  Location: [facility] INVASIVE CV LAB;  Service: Cardiovascular;  Laterality: N/A;    Prior to Admission medications   Medication Sig Start Date End Date Taking? Authorizing Provider  amLODipine  (NORVASC )  10 MG tablet Take 1 tablet (10 mg total) by mouth daily. 11/21/23   Knute Thersia Bitters, FNP  aspirin  EC 81 MG tablet Take 1 tablet (81 mg total) by mouth daily. Swallow whole. 09/28/23   Adams, Zane, PA-C  atorvastatin  (LIPITOR) 40 MG tablet Take 1 tablet (40 mg total) by mouth daily. 01/11/24 04/10/24  Shlomo Wilbert SAUNDERS, MD  clopidogrel  (PLAVIX ) 75 MG tablet Take 1 tablet (75 mg total) by mouth daily. 10/21/23   Duke, Jon Garre, PA  doxycycline (VIBRA-TABS) 100 MG tablet Take 1 tablet (100 mg total) by mouth 2 (two) times daily for 7 days. 01/24/24 02/02/24  Dreama, [place]  N, FNP  DULoxetine (CYMBALTA) 60 MG capsule Take 1 capsule (60 mg total) by mouth daily for 7 days, THEN 1 capsule (60 mg total) 2 (two) times daily 01/26/24 03/01/24    empagliflozin  (JARDIANCE ) 10 MG TABS tablet Take 1 tablet (10 mg total) by mouth daily. 09/27/23   Adams, Zane, PA-C  ezetimibe  (ZETIA ) 10 MG tablet Take 1 tablet (10 mg total) by mouth daily. 01/20/24 04/19/24  Shlomo Wilbert SAUNDERS, MD  losartan  (COZAAR ) 100 MG tablet Take 1 tablet (100 mg total) by mouth daily. 11/21/23 02/19/24  Knute Thersia Bitters, FNP  metFORMIN  (GLUCOPHAGE ) 1000 MG tablet Take 1 tablet (1,000 mg total) by mouth 2 (two) times daily with a meal. 01/02/24   Caudle, Thersia Bitters, FNP  metoprolol  succinate (TOPROL  XL) 25 MG 24 hr tablet Take 1  tablet (25 mg total) by mouth daily. 10/21/23   Duke, Jon Garre, PA  mupirocin ointment (BACTROBAN) 2 % Apply 1 Application topically 2 (two) times daily. 01/24/24   Dreama, [place]  N, FNP  nitroGLYCERIN  (NITROSTAT ) 0.4 MG SL tablet Place 1 tablet (0.4 mg total) under the tongue every 5 (five) minutes x 3 doses as needed for chest pain. 09/27/23   Adams, Zane, PA-C  pantoprazole  (PROTONIX ) 20 MG tablet Take 1 tablet (20 mg total) by mouth daily. 11/11/23   Vonna Sharlet POUR, MD  rOPINIRole  (REQUIP ) 0.5 MG tablet Take 1-2 tablets (0.5-1 mg total) by mouth 1 to 3 hours before bedtime. 12/29/23     traMADol (ULTRAM) 50  MG tablet Take 1 tablet (50 mg total) by mouth 3 (three) times daily as needed. 01/27/24     insulin  glargine (LANTUS) 100 UNIT/ML injection Inject 16 Units into the skin daily.  04/14/19  [provider]  pravastatin (PRAVACHOL) 40 MG tablet Take 40 mg by mouth daily.  04/14/19  [provider]    Family History  Problem Relation Age of Onset   Cancer Mother    Cancer Father    Hypertension Sister    Heart murmur Sister    CVA Brother    CVA Brother    Hypertension Brother    Colon polyps Neg Hx    Colon cancer Neg Hx    Esophageal cancer Neg Hx    Pancreatic cancer Neg Hx    Stomach cancer Neg Hx      Social History   Tobacco Use   Smoking status: Never    Passive exposure: Never   Smokeless tobacco: Never  Vaping Use   Vaping status: Never Used  Substance Use Topics   Alcohol use: Not Currently    Comment: quit in 2020   Drug use: No    Allergies as of 02/01/2024   (No Known Allergies)    Review of Systems:    All systems reviewed and negative except where noted in HPI.   Physical Exam:  BP 110/70   Pulse 92   Ht 5' 6 (1.676 m)   Wt 130 lb (59 kg)   SpO2 97%   BMI 20.98 kg/m  No LMP for male patient.  General:   Alert,  Well-developed, well-nourished, pleasant and cooperative in NAD Lungs:  Respirations even and unlabored.  Clear throughout to auscultation.   No wheezes, crackles, or rhonchi. No acute distress. Heart:  Regular rate and rhythm; no murmurs, clicks, rubs, or gallops. Abdomen:  Normal bowel sounds.  No bruits.  Soft, and non-distended without masses, hepatosplenomegaly or hernias noted.  No Tenderness.  No guarding or rebound tenderness.    Neurologic:  Alert and oriented x3;  grossly normal neurologically. Psych:  Alert and cooperative. Normal mood and affect.  Imaging Studies: DG Finger Middle Left Result Date: 01/24/2024 CLINICAL DATA:  Wound to middle finger EXAM: LEFT MIDDLE FINGER 2+V COMPARISON:  06/26/2020  FINDINGS: There is no evidence of fracture or dislocation. There is no evidence of arthropathy or other focal bone abnormality. Soft tissues are unremarkable. IMPRESSION: Negative. Electronically Signed   By: Luke Bun M.D.   On: 01/24/2024 19:00    Labs: CBC    Component Value Date/Time   WBC 11.3 (H) 11/16/2023 0322   RBC 4.24 11/16/2023 0322   HGB 13.0 11/16/2023 0322   HCT 39.3 11/16/2023 0322   PLT 307 11/16/2023 0322   MCV 92.7 11/16/2023 0322  CMP     Component Value Date/Time   NA 138 12/28/2023 1342   K 4.1 12/28/2023 1342   CL 104 12/28/2023 1342   CO2 18 (L) 12/28/2023 1342   GLUCOSE 223 (H) 12/28/2023 1342   GLUCOSE 130 (H) 11/16/2023 0322   BUN 11 12/28/2023 1342   CREATININE 0.90 12/28/2023 1342   CALCIUM  9.1 12/28/2023 1342   PROT 7.4 11/14/2023 1215   ALBUMIN 3.6 11/14/2023 1215   AST 21 11/14/2023 1215   ALT 14 11/14/2023 1215   ALKPHOS 103 11/14/2023 1215   BILITOT 0.9 11/14/2023 1215   GFRNONAA >60 11/16/2023 0322   GFRAA >60 08/15/2017 2115    Assessment and Plan:   JESS TONEY is a 53 y.o. y/o male has been referred for:  1.  Colon cancer screening: No prior screening. - Ordered screening Cologuard - We cannot perform traditional colonoscopy until patient is safe to hold Plavix  5 days prior to colonoscopy.  He had MI with cardiac stent 09/2023 and CVA 10/2023.  Currently on Plavix  and aspirin .  He should remain on uninterrupted Plavix  for at least 6 to 12 months post-event. - If Cologuard is positive, then we can reevaluate for diagnostic colonoscopy. - He has no GI symptoms.  No alarm symptoms such as anemia.  2.  Comorbidities:  CAD, hypertension, NSTEMI with stent placed (09/25/2023), CVA (11/14/2023), type 2 diabetes, hyperlipidemia, chronic low back pain.  Currently on aspirin  and Plavix .  10/2023 echo LVEF 55 to 60%.  Follow up based on Cologuard test results.  Ellouise Console, PA-C

## 2024-02-01 ENCOUNTER — Encounter: Payer: Self-pay | Admitting: Physician Assistant

## 2024-02-01 ENCOUNTER — Ambulatory Visit: Admitting: Physician Assistant

## 2024-02-01 VITALS — BP 110/70 | HR 92 | Ht 66.0 in | Wt 130.0 lb

## 2024-02-01 DIAGNOSIS — Z9861 Coronary angioplasty status: Secondary | ICD-10-CM | POA: Diagnosis not present

## 2024-02-01 DIAGNOSIS — Z1211 Encounter for screening for malignant neoplasm of colon: Secondary | ICD-10-CM

## 2024-02-01 DIAGNOSIS — Z7901 Long term (current) use of anticoagulants: Secondary | ICD-10-CM

## 2024-02-01 DIAGNOSIS — I2510 Atherosclerotic heart disease of native coronary artery without angina pectoris: Secondary | ICD-10-CM

## 2024-02-01 NOTE — Patient Instructions (Signed)
 Your provider has ordered Cologuard testing as an option for colon cancer screening. This is performed by [employer] and may be out of network with your insurance. PRIOR to completing the test, it is YOUR responsibility to contact your insurance about covered benefits for this test. Your out of pocket expense could be anywhere from $0.00 to $649.00.   When you call to check coverage with your insurer, please provide the following information:   -The ONLY provider of Cologuard is Opt[facility], dispensing  - CPT code for Cologuard is [phone].  [facility] NPI # [id number]  -[employer] Tax ID # [id number]   We have already sent your demographic and insurance information to [employer] (phone number [phone]) and they should contact you within the next week regarding your test. If you have not heard from them within the next week, please call our office at [phone].  Please follow up sooner if symptoms increase or worsen  Due to recent changes in healthcare laws, you may see the results of your imaging and laboratory studies on MyChart before your provider has had a chance to review them.  We understand that in some cases there may be results that are confusing or concerning to you. Not all laboratory results come back in the same time frame and the provider may be waiting for multiple results in order to interpret others.  Please give us  48 hours in order for your provider to thoroughly review all the results before contacting the office for clarification of your results.   Thank you for trusting me with your gastrointestinal care!   Ellouise Console, PA-C _______________________________________________________  If your blood pressure at your visit was 140/90 or greater, please contact your primary care physician to follow up on this.  _______________________________________________________  If you are age 55 or older, your body mass index should be  between 23-30. Your Body mass index is 20.98 kg/m. If this is out of the aforementioned range listed, please consider follow up with your Primary Care Provider.  If you are age 24 or younger, your body mass index should be between 19-25. Your Body mass index is 20.98 kg/m. If this is out of the aformentioned range listed, please consider follow up with your Primary Care Provider.   ________________________________________________________  The [facility] providers would like to encourage you to use MYCHART to communicate with providers for non-urgent requests or questions.  Due to long hold times on the telephone, sending your provider a message by [facility] may be a faster and more efficient way to get a response.  Please allow 48 business hours for a response.  Please remember that this is for non-urgent requests.  _______________________________________________________

## 2024-02-02 NOTE — Progress Notes (Signed)
 Noted

## 2024-02-07 NOTE — Telephone Encounter (Signed)
 S/w the pt and he has been scheduled 03/21/24 to see Lum Louis, NP at 1:55 for preop clearance.   I will update all parties involved.

## 2024-02-07 NOTE — Telephone Encounter (Signed)
 S/w Marjorie at dental office who does confirm they did get our notes about the pt will need to wait until after 03/27/24 before he can proceed with dental procedure. see notes.    I stated the pt had called today. Marjorie said yes he is calling to see when he can hold the Plavix  and ASA to have his procedure. I stated I will need to run this past my preop APP to review if the pt is going to need a tele preop APP. I do not see anything noted if he needs an appt closer to 03/2024. Marjorie, said correct and that is why the pt is calling.

## 2024-02-07 NOTE — Telephone Encounter (Signed)
Pt calling to f/u on Clearance. Please advise

## 2024-02-08 ENCOUNTER — Other Ambulatory Visit (HOSPITAL_COMMUNITY): Payer: Self-pay

## 2024-02-08 ENCOUNTER — Ambulatory Visit

## 2024-02-13 ENCOUNTER — Ambulatory Visit

## 2024-02-13 DIAGNOSIS — M79661 Pain in right lower leg: Secondary | ICD-10-CM

## 2024-02-13 DIAGNOSIS — R41844 Frontal lobe and executive function deficit: Secondary | ICD-10-CM

## 2024-02-13 DIAGNOSIS — M5459 Other low back pain: Secondary | ICD-10-CM | POA: Diagnosis not present

## 2024-02-13 DIAGNOSIS — I639 Cerebral infarction, unspecified: Secondary | ICD-10-CM | POA: Diagnosis not present

## 2024-02-13 DIAGNOSIS — R41840 Attention and concentration deficit: Secondary | ICD-10-CM | POA: Diagnosis not present

## 2024-02-13 DIAGNOSIS — R2689 Other abnormalities of gait and mobility: Secondary | ICD-10-CM

## 2024-02-13 DIAGNOSIS — R41842 Visuospatial deficit: Secondary | ICD-10-CM

## 2024-02-13 DIAGNOSIS — R278 Other lack of coordination: Secondary | ICD-10-CM

## 2024-02-13 DIAGNOSIS — M48061 Spinal stenosis, lumbar region without neurogenic claudication: Secondary | ICD-10-CM

## 2024-02-13 DIAGNOSIS — Z1211 Encounter for screening for malignant neoplasm of colon: Secondary | ICD-10-CM | POA: Diagnosis not present

## 2024-02-13 DIAGNOSIS — M79662 Pain in left lower leg: Secondary | ICD-10-CM | POA: Diagnosis not present

## 2024-02-13 NOTE — Therapy (Signed)
 OUTPATIENT PHYSICAL THERAPY NEURO PROGRESS REPORT AND UPOC   Patient Name: ZEVIN NEVARES MRN: [id number] DOB:11-24-1970, 53 y.o., male Today's Date: 02/13/2024   PCP: Thersia Stark REFERRING PROVIDER: Lonni Dalton  END OF SESSION:  PT End of Session - 02/13/24 1147     Visit Number 8    Date for Recertification  04/09/24    Authorization Type Managed Medicaid    PT Start Time 1147    PT Stop Time 1227    PT Time Calculation (min) 40 min    Activity Tolerance Patient tolerated treatment well    Behavior During Therapy [facility] for tasks assessed/performed          Past Medical History:  Diagnosis Date   Asthma    Diabetes mellitus without complication (HCC)    Eczema    GERD (gastroesophageal reflux disease)    Hypertension    Myocardial infarction (HCC)    Sciatica    Stroke [facility])    Past Surgical History:  Procedure Laterality Date   CORONARY STENT INTERVENTION N/A 09/26/2023   Procedure: CORONARY STENT INTERVENTION;  Surgeon: [place]il[place], [name], MD;  Location: [facility] INVASIVE CV LAB;  Service: Cardiovascular;  Laterality: N/A;   HERNIA REPAIR     LEFT HEART CATH AND CORONARY ANGIOGRAPHY N/A 09/26/2023   Procedure: LEFT HEART CATH AND CORONARY ANGIOGRAPHY;  Surgeon: [place], [name], MD;  Location: [facility] INVASIVE CV LAB;  Service: Cardiovascular;  Laterality: N/A;   Patient Active Problem List   Diagnosis Date Noted   Hyperlipidemia LDL goal <55 01/11/2024   CAD S/P percutaneous coronary angioplasty 11/15/2023   Stroke (HCC) 11/14/2023   Chronic low back pain 10/26/2023   NSTEMI (non-ST elevated myocardial infarction) (HCC) 09/25/2023   Impaired gait 03/31/2023   Type 2 diabetes mellitus with hyperglycemia, without long-term current use of insulin  (HCC) 03/31/2023   Neuropathic pain 03/31/2023   Spinal stenosis of lumbar region 04/22/2020   Acute pain of left knee 09/17/2019   Protrusion of lumbar intervertebral disc 05/29/2019   SOB (shortness of breath)  05/10/2013   Essential hypertension, benign 04/24/2013   Chest pain, unspecified 04/24/2013    ONSET DATE: 11/14/23  REFERRING DIAG:  I63.9 (ICD-10-CM) - Acute ischemic stroke (HCC)    THERAPY DIAG:  Other lack of coordination  Other abnormalities of gait and mobility  Spinal stenosis of lumbar region, unspecified whether neurogenic claudication present  Cerebrovascular accident (CVA), unspecified mechanism (HCC)  Other low back pain  Pain in both lower legs  Acute ischemic stroke (HCC)  Frontal lobe and executive function deficit  Attention and concentration deficit  Visuospatial deficit  Rationale for Evaluation and Treatment: Rehabilitation  SUBJECTIVE:  SUBJECTIVE STATEMENT:  Pt reports that his back is not doing well and that his back is really bothering his LE a lot, especially when he stands and walks for long periods. It can get up to a 10/10 pain when I am walking.  PERTINENT HISTORY: ADREAN HEITZ is a 53 y.o. male with medical history significant of DM2, CAD spinal stenosis   Presented with change in sensation left arm Hx of DM2, CAD, HTN. Here with 4 days of trouble swallowing, numbness of left hand and arm, double vision. MRI showing acute CVA in the pons Unable to detect on cold or hot in his left hand and arm for past 4 days. No weakness in the left arm no change in sensation or strength in his left leg. Did feel that his speech was slurred  PAIN:  Are you having pain? Yes: NPRS scale: 8-9/10 when standing or walking, no pain when sitting Pain location: legs  Pain description: feelings like charlie horses in my hamstrings all the time  Aggravating factors: walking and standing  Relieving factors: nothing really  PRECAUTIONS: Fall  RED FLAGS: None   WEIGHT  BEARING RESTRICTIONS: No  FALLS: Has patient fallen in last 6 months? No  LIVING ENVIRONMENT: Lives with: lives with an adult companion Lives in: House/apartment Stairs: Yes: Internal: 18 steps; on left going up Has following equipment at home: Single point cane and Walker - 4 wheeled  PLOF: Independent, Independent with basic ADLs, and Independent with gait  PATIENT GOALS: to get better than what I am   OBJECTIVE:  Note: Objective measures were completed at Evaluation unless otherwise noted.  DIAGNOSTIC FINDINGS:  IMPRESSION: 1. 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 2. Mild stenosis of the proximal basilar artery. 3. Moderate stenosis of the right anterior cerebral artery A2 segment.  MPRESSION: 1. Small acute infarct within the right pons in the region of the paramedian pontine reticular formation, likely cause of diplopia. 2. 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 3. Multifocal hyperintense T2-weighted signal within the cerebral white matter, most commonly due to chronic small vessel disease. 4. Normal MRI of the orbits  COGNITION: Overall cognitive status: Within functional limits for tasks assessed   SENSATION: WFL Hot/Cold: Impaired in LUE  COORDINATION: Decreased coordination   MUSCLE LENGTH: Hamstrings: very tight in HS  POSTURE: rounded shoulders, decreased lumbar lordosis, and R shoulder is lowered   LOWER EXTREMITY ROM:   WFL    LOWER EXTREMITY MMT:  5/5 BLE   GAIT: Findings: Gait Characteristics: decreased arm swing- Right, decreased step length- Left, decreased stance time- Right, decreased stride length, decreased hip/knee flexion- Right, ataxic, antalgic, wide BOS, and poor foot clearance- Right, Distance walked: in clinic distances, and Comments: antalgic gait that he reports has been like this for almost 2 years and says it is due to spinal stenosis and pain in back of legs   FUNCTIONAL TESTS:  5 times sit to stand:  10s Timed up and go (TUG): 16s  TREATMENT DATE: 02/13/24 LE SciFit lvl 3 for 6 min BERG - 50/56 (GOAL MET) Gait - demos antlagic gait d/t pain in RLE and in R HS that radiates from extension of the L/S Calf Stretch to HS Stretch on Steps PPT with 5 second holds 3 x 5 PPT with gluteal squeezes 3 x 5  PPT with bridges 2 x 6 - pt endorsed increase in pain Side-lying Clamshells 2 x 10  01/31/24 Bike L3 x40mins Rows and Lats 25# 2x10 Leg ext 10# 2x12 HS curls 25# 2x12 STS with OHP 2x10 HS stretch 30s  Feet on pball rotations, knees to chest, small bridges x5 Bridge with hip abd 2x10   01/25/24 NuStep L5x7mins Bridges with ball squeeze 2x10 HS stretch with strap 30s x2  Side steps on to airex Step ups 6 Shoulder ext 5# 2x10 Leg press 20# 2x10 STS with shoulder flexion WaTe 3# 2x10  01/18/24  Nustep L5x8 minutes all four extremities seat 7  Piriformis stretches 2x30 seconds B hooklying  HS stretch with strap 2x30 seconds B Bridges into green TB x15  PPT 10x3 second holds Sidelying hip ABD x10 B no resistance Fitter pushes blue band x15 B On blue air pad: TA sets 10x3 second holds, TA set + march x20 alternating, lateral trunk crunches alternating x20     01/09/24  Scifit bike L4x8 minutes seat 8 (nustep not available)  Bridges + ABD into green TB 2x10  Sidelying clams green TB x12 B Piriformis stretch 2x30 seconds B HS stretch with strap 1x30 seconds B STS green TB above knees x10 Standing hip ABD green TB  x10 B  QL stretch pushing stool forward seated 4x30 seconds   Tandem stance solid surface 2x30 seconds B  Narrow BOS x60 seconds blue foam pad        PATIENT EDUCATION: Education details: POC, HEP, fall risk,  Person educated: Patient Education method: Explanation Education comprehension: verbalized understanding  HOME  EXERCISE PROGRAM: Access Code: [email or web address] URL: [email or web address] Date: 02/13/2024 Prepared by: Lateisha Thurlow  Exercises - Sit to Stand with Arms Crossed  - 1 x daily - 7 x weekly - 3 sets - 10 reps - Supine Posterior Pelvic Tilt  - 1 x daily - 7 x weekly - 3 sets - 10 reps - 5 hold - Standing Hip Abduction with Counter Support  - 1 x daily - 7 x weekly - 3 sets - 10 reps - Seated Hamstring Stretch  - 1 x daily - 7 x weekly - 3 sets - 10 reps - Hooklying Gluteal Sets  - 1 x daily - 7 x weekly - 3 sets - 10 reps - Clamshell with Resistance  - 1 x daily - 7 x weekly - 3 sets - 10 reps - Gastroc Stretch on Wall  - 1 x daily - 7 x weekly - 3 sets - 10 reps  GOALS: Goals reviewed with patient? Yes  SHORT TERM GOALS: Target date: 01/09/24  Patient will be independent with initial HEP.  Baseline:  Goal status: MET 01/09/24  2.  Patient will complete TUG < 13s  Baseline: 16 Goal status: MET 12.58s 01/04/24   LONG TERM GOALS: Target date: 02/13/24  Patient will be independent with advanced/ongoing HEP to improve outcomes and carryover.  Baseline:  Goal status: INITIAL; progressing - working on some, but gets frustrated 02/13/24  2.  Patient will report 50% improvement in low back and hamstrings to improve QOL.  Baseline: 8/10 with activity  Goal  status: IN PROGRESS eased up some 01/31/24; minimal progression 02/13/24  3.  Patient will improve BERG to 50/56 to decrease risk for falls.  Baseline: 42/56 Goal status: IN PROGRESS 44/56 01/31/24; 02/13/24 50/56 (GOAL MET)  4.  Patient will be able to walk 100-273ft with normalized gait pattern.    Baseline: limping on RLE Goal status: IN PROGRESS 01/25/24; 02/13/24 minimal progression - antalgic gait present d/t weakness and pain  5.  Patient will tolerate 15-20 min of standing/sitting/walking. Baseline: < 5 mins  Goal status: IN PROGRESS 01/25/24; not progressing 02/13/24 - too much pain   ASSESSMENT:  CLINICAL  IMPRESSION: Pt reports increase in pain. Wishes to maybe hold off on PT after next week's visit until after he sees his spine physician in November. Reassessed goals and found that he met his balance goals, but made little to no progress with his functional activity goals d/t pain in his legs from his spinal stenosis. Went over his HEP and provided new printout for him to use during the time. Will reassess his pain and compliance with HEP during upcoming visit next week.    OBJECTIVE IMPAIRMENTS: Abnormal gait, decreased balance, decreased endurance, difficulty walking, increased muscle spasms, improper body mechanics, postural dysfunction, and pain.   ACTIVITY LIMITATIONS: carrying, lifting, bending, standing, squatting, stairs, and locomotion level  PARTICIPATION LIMITATIONS: cleaning, laundry, shopping, community activity, occupation, and yard work  PERSONAL FACTORS: Education, Fitness, Past/current experiences, and 1-2 comorbidities: chronic pain, impaired gait, neuropathic pain, DM, Stroke are also affecting patient's functional outcome.   REHAB POTENTIAL: Good  CLINICAL DECISION MAKING: Evolving/moderate complexity  EVALUATION COMPLEXITY: Moderate  PLAN:  PT FREQUENCY: 1-2x/week  PT DURATION: 10 weeks  PLANNED INTERVENTIONS: 97110-Therapeutic exercises, 97530- Therapeutic activity, [id number]- Neuromuscular re-education, 97535- Self Care, [id number]- Manual therapy, [phone]- Gait training, [phone]- Electrical stimulation (unattended), [id number] (1-2 muscles), [phone] (3+ muscles)- Dry Needling, Patient/Family education, Balance training, Stair training, Taping, Joint mobilization, Joint manipulation, Spinal manipulation, Spinal mobilization, Cryotherapy, and Moist heat  PLAN FOR NEXT SESSION: balance, gait training, LE stretching and strengthening. Encourage LRAD to reduce fall risk, high fall risk    Mizani Dilday, PT, DPT 02/13/24 12:36 PM

## 2024-02-17 ENCOUNTER — Ambulatory Visit: Payer: Self-pay | Admitting: Physician Assistant

## 2024-02-17 LAB — COLOGUARD: COLOGUARD: NEGATIVE

## 2024-02-23 ENCOUNTER — Ambulatory Visit

## 2024-02-23 DIAGNOSIS — M79661 Pain in right lower leg: Secondary | ICD-10-CM | POA: Diagnosis not present

## 2024-02-23 DIAGNOSIS — M5459 Other low back pain: Secondary | ICD-10-CM

## 2024-02-23 DIAGNOSIS — I639 Cerebral infarction, unspecified: Secondary | ICD-10-CM

## 2024-02-23 DIAGNOSIS — M48061 Spinal stenosis, lumbar region without neurogenic claudication: Secondary | ICD-10-CM | POA: Diagnosis not present

## 2024-02-23 DIAGNOSIS — R41842 Visuospatial deficit: Secondary | ICD-10-CM | POA: Diagnosis not present

## 2024-02-23 DIAGNOSIS — R41844 Frontal lobe and executive function deficit: Secondary | ICD-10-CM

## 2024-02-23 DIAGNOSIS — R2689 Other abnormalities of gait and mobility: Secondary | ICD-10-CM

## 2024-02-23 DIAGNOSIS — R41840 Attention and concentration deficit: Secondary | ICD-10-CM | POA: Diagnosis not present

## 2024-02-23 DIAGNOSIS — R278 Other lack of coordination: Secondary | ICD-10-CM | POA: Diagnosis not present

## 2024-02-23 DIAGNOSIS — M79662 Pain in left lower leg: Secondary | ICD-10-CM | POA: Diagnosis not present

## 2024-02-23 NOTE — Therapy (Signed)
 OUTPATIENT PHYSICAL THERAPY NEURO TREATMENT & DISCHARGE   Patient Name: [facility] MRN: [id number] DOB:02-08-1971, 53 y.o., male Today's Date: 02/23/2024   PCP: [facility] REFERRING PROVIDER: Lonni Bell  END OF SESSION:  PT End of Session - 02/23/24 1201     Visit Number 9    Date for Recertification  04/09/24    PT Start Time 1200    PT Stop Time 1230    PT Time Calculation (min) 30 min           Past Medical History:  Diagnosis Date   Asthma    Diabetes mellitus without complication ([facility])    Eczema    GERD (gastroesophageal reflux disease)    Hypertension    Myocardial infarction ([facility])    Sciatica    Stroke [facility])    Past Surgical History:  Procedure Laterality Date   CORONARY STENT INTERVENTION N/A 09/26/2023   Procedure: CORONARY STENT INTERVENTION;  Surgeon: [name], [name], MD;  Location: [facility];  Service: Cardiovascular;  Laterality: N/A;   HERNIA REPAIR     LEFT HEART CATH AND CORONARY ANGIOGRAPHY N/A 09/26/2023   Procedure: LEFT HEART CATH AND CORONARY ANGIOGRAPHY;  Surgeon: [name], [name], MD;  Location: [facility] INVASIVE [facility];  Service: Cardiovascular;  Laterality: N/A;   Patient Active Problem List   Diagnosis Date Noted   Hyperlipidemia LDL goal <55 01/11/2024   CAD S/P percutaneous coronary angioplasty 11/15/2023   Stroke ([facility]) 11/14/2023   Chronic low back pain 10/26/2023   NSTEMI (non-ST elevated myocardial infarction) ([facility]) 09/25/2023   Impaired gait 03/31/2023   Type 2 diabetes mellitus with hyperglycemia, without long-term current use of insulin  ([facility]) 03/31/2023   Neuropathic pain 03/31/2023   Spinal stenosis of lumbar region 04/22/2020   Acute pain of left knee 09/17/2019   Protrusion of lumbar intervertebral disc 05/29/2019   SOB (shortness of breath) 05/10/2013   Essential hypertension, benign 04/24/2013   Chest pain, unspecified 04/24/2013    ONSET DATE: 11/14/23  REFERRING DIAG:  I63.9 (ICD-10-CM) - Acute  ischemic stroke ([facility])    THERAPY DIAG:  Other lack of coordination  Other low back pain  Other abnormalities of gait and mobility  Spinal stenosis of lumbar region, unspecified whether neurogenic claudication present  Cerebrovascular accident (CVA), unspecified mechanism ([facility])  Attention and concentration deficit  Pain in both lower legs  Frontal lobe and executive function deficit  Acute ischemic stroke [facility])  Rationale for Evaluation and Treatment: Rehabilitation  SUBJECTIVE:  SUBJECTIVE STATEMENT:  Pt arrives 15 min late. Reports 10/10 LBP still, despite attempting HEP at home. Wants to meet with doctors and discuss surgical intervention for spinal stenosis and put PT on hold for a bit.    PERTINENT HISTORY: Peter Bell is a 53 y.o. male with medical history significant of DM2, CAD spinal stenosis   Presented with change in sensation left arm Hx of DM2, CAD, HTN. Here with 4 days of trouble swallowing, numbness of left hand and arm, double vision. MRI showing acute CVA in the pons Unable to detect on cold or hot in his left hand and arm for past 4 days. No weakness in the left arm no change in sensation or strength in his left leg. Did feel that his speech was slurred  PAIN:  Are you having pain? Yes: NPRS scale: 8-9/10 when standing or walking, no pain when sitting Pain location: legs  Pain description: feelings like charlie horses in my hamstrings all the time  Aggravating factors: walking and standing  Relieving factors: nothing really  PRECAUTIONS: Fall  RED FLAGS: None   WEIGHT BEARING RESTRICTIONS: No  FALLS: Has patient fallen in last 6 months? No  LIVING ENVIRONMENT: Lives with: lives with an adult companion Lives in: House/apartment Stairs: Yes: Internal: 18  steps; on left going up Has following equipment at home: Single point cane and Walker - 4 wheeled  PLOF: Independent, Independent with basic ADLs, and Independent with gait  PATIENT GOALS: to get better than what I am   OBJECTIVE:  Note: Objective measures were completed at Evaluation unless otherwise noted.  DIAGNOSTIC FINDINGS:  IMPRESSION: 1. 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 2. Mild stenosis of the proximal basilar artery. 3. Moderate stenosis of the right anterior cerebral artery A2 segment.  MPRESSION: 1. Small acute infarct within the right pons in the region of the paramedian pontine reticular formation, likely cause of diplopia. 2. 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 3. Multifocal hyperintense T2-weighted signal within the cerebral white matter, most commonly due to chronic small vessel disease. 4. Normal MRI of the orbits  COGNITION: Overall cognitive status: Within functional limits for tasks assessed   SENSATION: WFL Hot/Cold: Impaired in LUE  COORDINATION: Decreased coordination   MUSCLE LENGTH: Hamstrings: very tight in HS  POSTURE: rounded shoulders, decreased lumbar lordosis, and R shoulder is lowered   LOWER EXTREMITY ROM:   WFL    LOWER EXTREMITY MMT:  5/5 BLE   GAIT: Findings: Gait Characteristics: decreased arm swing- Right, decreased step length- Left, decreased stance time- Right, decreased stride length, decreased hip/knee flexion- Right, ataxic, antalgic, wide BOS, and poor foot clearance- Right, Distance walked: in clinic distances, and Comments: antalgic gait that he reports has been like this for almost 2 years and says it is due to spinal stenosis and pain in back of legs   FUNCTIONAL TESTS:  5 times sit to stand: 10s Timed up and go (TUG): 16s  TREATMENT DATE: 02/23/24 Nustep  lvl 4 for 5 min Review HEP: - PPT in Sitting - Forward Folds in sitting - Glute Squeezes - Clamshells Manual Traction of BIL LE for indirect spinal traction - mild relief of symptoms in LE  02/13/24 LE SciFit lvl 3 for 6 min BERG - 50/56 (GOAL MET) Gait - demos antlagic gait d/t pain in RLE and in R HS that radiates from extension of the L/S Calf Stretch to HS Stretch on Steps PPT with 5 second holds 3 x 5 PPT with gluteal squeezes 3 x 5  PPT with bridges 2 x 6 - pt endorsed increase in pain Side-lying Clamshells 2 x 10  01/31/24 Bike L3 x84mins Rows and Lats 25# 2x10 Leg ext 10# 2x12 HS curls 25# 2x12 STS with OHP 2x10 HS stretch 30s  Feet on pball rotations, knees to chest, small bridges x5 Bridge with hip abd 2x10   01/25/24 NuStep L5x67mins Bridges with ball squeeze 2x10 HS stretch with strap 30s x2  Side steps on to airex Step ups 6 Shoulder ext 5# 2x10 Leg press 20# 2x10 STS with shoulder flexion WaTe 3# 2x10  01/18/24  Nustep L5x8 minutes all four extremities seat 7  Piriformis stretches 2x30 seconds B hooklying  HS stretch with strap 2x30 seconds B Bridges into green TB x15  PPT 10x3 second holds Sidelying hip ABD x10 B no resistance Fitter pushes blue band x15 B On blue air pad: TA sets 10x3 second holds, TA set + march x20 alternating, lateral trunk crunches alternating x20     01/09/24  Scifit bike L4x8 minutes seat 8 (nustep not available)  Bridges + ABD into green TB 2x10  Sidelying clams green TB x12 B Piriformis stretch 2x30 seconds B HS stretch with strap 1x30 seconds B STS green TB above knees x10 Standing hip ABD green TB  x10 B  QL stretch pushing stool forward seated 4x30 seconds   Tandem stance solid surface 2x30 seconds B  Narrow BOS x60 seconds blue foam pad        PATIENT EDUCATION: Education details: POC, HEP, fall risk,  Person educated: Patient Education method: Explanation Education comprehension: verbalized  understanding  HOME EXERCISE PROGRAM: Access Code: [email or web address] URL: [email or web address] Date: 02/13/2024 Prepared by: Richie Bonanno  Exercises - Sit to Stand with Arms Crossed  - 1 x daily - 7 x weekly - 3 sets - 10 reps - Supine Posterior Pelvic Tilt  - 1 x daily - 7 x weekly - 3 sets - 10 reps - 5 hold - Standing Hip Abduction with Counter Support  - 1 x daily - 7 x weekly - 3 sets - 10 reps - Seated Hamstring Stretch  - 1 x daily - 7 x weekly - 3 sets - 10 reps - Hooklying Gluteal Sets  - 1 x daily - 7 x weekly - 3 sets - 10 reps - Clamshell with Resistance  - 1 x daily - 7 x weekly - 3 sets - 10 reps - Gastroc Stretch on Wall  - 1 x daily - 7 x weekly - 3 sets - 10 reps  GOALS: Goals reviewed with patient? Yes  SHORT TERM GOALS: Target date: 01/09/24  Patient will be independent with initial HEP.  Baseline:  Goal status: MET 01/09/24  2.  Patient will complete TUG < 13s  Baseline: 16 Goal status: MET 12.58s 01/04/24   LONG TERM GOALS: Target date: 02/13/24  Patient will be independent with advanced/ongoing HEP to improve  outcomes and carryover.  Baseline:  Goal status: INITIAL; progressing - working on some, but gets frustrated 02/13/24; 02/23/24 Not Met (painful)  2.  Patient will report 50% improvement in low back and hamstrings to improve QOL.  Baseline: 8/10 with activity  Goal status: IN PROGRESS eased up some 01/31/24; minimal progression 02/13/24; 10/30 - Not Met  3.  Patient will improve BERG to 50/56 to decrease risk for falls.  Baseline: 42/56 Goal status: IN PROGRESS 44/56 01/31/24; 02/13/24 50/56 (GOAL MET)  4.  Patient will be able to walk 100-225ft with normalized gait pattern.    Baseline: limping on RLE Goal status: IN PROGRESS 01/25/24; 02/13/24 minimal progression - antalgic gait present d/t weakness and pain; 10/30 - Not met  5.  Patient will tolerate 15-20 min of standing/sitting/walking. Baseline: < 5 mins  Goal status: IN PROGRESS  01/25/24; not progressing 02/13/24 - too much pain; 10/30 - Not Met   ASSESSMENT:  CLINICAL IMPRESSION: Pt reports increase in pain still. Wishes to maybe hold off on PT until he meets with spine specialists and doctors to discuss surgical interventions. Went over his HEP and provided new handout for him to use during the time. Responded well to manual traction. D/C d/t minimal progress and patient preference.   OBJECTIVE IMPAIRMENTS: Abnormal gait, decreased balance, decreased endurance, difficulty walking, increased muscle spasms, improper body mechanics, postural dysfunction, and pain.   ACTIVITY LIMITATIONS: carrying, lifting, bending, standing, squatting, stairs, and locomotion level  PARTICIPATION LIMITATIONS: cleaning, laundry, shopping, community activity, occupation, and yard work  PERSONAL FACTORS: Education, Fitness, Past/current experiences, and 1-2 comorbidities: chronic pain, impaired gait, neuropathic pain, DM, Stroke are also affecting patient's functional outcome.   REHAB POTENTIAL: Good  CLINICAL DECISION MAKING: Evolving/moderate complexity  EVALUATION COMPLEXITY: Moderate  PLAN:  PT FREQUENCY: 1-2x/week  PT DURATION: 10 weeks  PLANNED INTERVENTIONS: 97110-Therapeutic exercises, 97530- Therapeutic activity, 97112- Neuromuscular re-education, 97535- Self Care, 02859- Manual therapy, [phone]- Gait training, [phone]- Electrical stimulation (unattended), [phone] (1-2 muscles), [phone] (3+ muscles)- Dry Needling, Patient/Family education, Balance training, Stair training, Taping, Joint mobilization, Joint manipulation, Spinal manipulation, Spinal mobilization, Cryotherapy, and Moist heat  PLAN: DISCHARGE  PHYSICAL THERAPY DISCHARGE SUMMARY  Visits from Start of Care: 9  Patient agrees to discharge. Patient goals were partially met. Patient is being discharged due to the patient's request. Wants to discuss options with spine specialists and pause PT.   Reyah Streeter, PT,  DPT 02/23/24 12:35 PM

## 2024-02-24 ENCOUNTER — Encounter: Admitting: Physical Medicine & Rehabilitation

## 2024-02-27 ENCOUNTER — Encounter: Payer: Self-pay | Admitting: Radiology

## 2024-03-01 ENCOUNTER — Encounter (HOSPITAL_BASED_OUTPATIENT_CLINIC_OR_DEPARTMENT_OTHER): Payer: Self-pay

## 2024-03-01 ENCOUNTER — Ambulatory Visit (HOSPITAL_BASED_OUTPATIENT_CLINIC_OR_DEPARTMENT_OTHER): Admitting: Family Medicine

## 2024-03-08 ENCOUNTER — Ambulatory Visit (INDEPENDENT_AMBULATORY_CARE_PROVIDER_SITE_OTHER): Admitting: Family Medicine

## 2024-03-08 ENCOUNTER — Other Ambulatory Visit (HOSPITAL_COMMUNITY): Payer: Self-pay

## 2024-03-08 ENCOUNTER — Encounter (HOSPITAL_BASED_OUTPATIENT_CLINIC_OR_DEPARTMENT_OTHER): Payer: Self-pay | Admitting: Family Medicine

## 2024-03-08 VITALS — BP 142/88 | HR 86 | Ht 66.0 in | Wt 136.6 lb

## 2024-03-08 DIAGNOSIS — M48061 Spinal stenosis, lumbar region without neurogenic claudication: Secondary | ICD-10-CM | POA: Diagnosis not present

## 2024-03-08 DIAGNOSIS — M5126 Other intervertebral disc displacement, lumbar region: Secondary | ICD-10-CM

## 2024-03-08 DIAGNOSIS — M792 Neuralgia and neuritis, unspecified: Secondary | ICD-10-CM

## 2024-03-08 DIAGNOSIS — K219 Gastro-esophageal reflux disease without esophagitis: Secondary | ICD-10-CM | POA: Diagnosis not present

## 2024-03-08 MED ORDER — PANTOPRAZOLE SODIUM 40 MG PO TBEC
40.0000 mg | DELAYED_RELEASE_TABLET | Freq: Every day | ORAL | 5 refills | Status: AC
  Filled 2024-03-08: qty 30, 30d supply, fill #0

## 2024-03-08 NOTE — Progress Notes (Signed)
 Subjective:   Peter Bell [birth year] 03/08/2024  Chief Complaint  Patient presents with   Medical Management of Chronic Issues    1-month follow up; wanted to know if he can have a referral to spine doctor.    HPI: Peter Bell presents today for re-assessment and management of chronic medical conditions.  CHRONIC BACK PAIN:  Pt states he is here today to request referral to a spine specialist. He reports chronic back pain x51yrs and has been dx'ed with spinal stenosis in the past. He reports completing PT 2wks ago. He states he has seen multiple providers for same complaint in the past and is ready to get an MRI, however the last MRI ordered during February 2025 was denied. Pt unsure of why it was denied. He was seen by a [facility] on 01/26/24 and states he did not like driving to [place], and reports they just gave me a shot that I told them would not work so I would like to see someone else. Pt also states he has seen an orthopedic provider in [place], [place] in the past and would like a new referral. He denies additional complaints or concerns, states his back pain today is the same as previous visits.   ACID REFLUX:  Pt requesting refill of pantoprazole .   The following portions of the patient's history were reviewed and updated as appropriate: past medical history, past surgical history, family history, social history, allergies, medications, and problem list.   Patient Active Problem List   Diagnosis Date Noted   Hyperlipidemia LDL goal <55 01/11/2024   CAD S/P percutaneous coronary angioplasty 11/15/2023   Stroke (HCC) 11/14/2023   Chronic low back pain 10/26/2023   NSTEMI (non-ST elevated myocardial infarction) (HCC) 09/25/2023   Impaired gait 03/31/2023   Type 2 diabetes mellitus with hyperglycemia, without long-term current use of insulin  (HCC) 03/31/2023   Neuropathic pain 03/31/2023   Spinal stenosis of lumbar region 04/22/2020    Acute pain of left knee 09/17/2019   Protrusion of lumbar intervertebral disc 05/29/2019   SOB (shortness of breath) 05/10/2013   Essential hypertension, benign 04/24/2013   Chest pain, unspecified 04/24/2013   Past Medical History:  Diagnosis Date   Asthma    Diabetes mellitus without complication (HCC)    Eczema    GERD (gastroesophageal reflux disease)    Hypertension    Myocardial infarction (HCC)    Sciatica    Stroke [facility])    Past Surgical History:  Procedure Laterality Date   CORONARY STENT INTERVENTION N/A 09/26/2023   Procedure: CORONARY STENT INTERVENTION;  Surgeon: [name], [name], MD;  Location: [facility];  Service: Cardiovascular;  Laterality: N/A;   HERNIA REPAIR     LEFT HEART CATH AND CORONARY ANGIOGRAPHY N/A 09/26/2023   Procedure: LEFT HEART CATH AND CORONARY ANGIOGRAPHY;  Surgeon: [name], [name], MD;  Location: [facility]VAS[facility];  Service: Cardiovascular;  Laterality: N/A;   Family History  Problem Relation Age of Onset   Cancer Mother    Cancer Father    Hypertension Sister    Heart murmur Sister    CVA Brother    CVA Brother    Hypertension Brother    Colon polyps Neg Hx    Colon cancer Neg Hx    Esophageal cancer Neg Hx    Pancreatic cancer Neg Hx    Stomach cancer Neg Hx    Outpatient Medications Prior to Visit  Medication Sig Dispense  Refill   amLODipine  (NORVASC ) 10 MG tablet Take 1 tablet (10 mg total) by mouth daily. 90 tablet 3   aspirin  EC 81 MG tablet Take 1 tablet (81 mg total) by mouth daily. Swallow whole. 90 tablet 3   atorvastatin  (LIPITOR) 40 MG tablet Take 1 tablet (40 mg total) by mouth daily. 90 tablet 3   clopidogrel  (PLAVIX ) 75 MG tablet Take 1 tablet (75 mg total) by mouth daily. 90 tablet 3   DULoxetine (CYMBALTA) 60 MG capsule Take 1 capsule (60 mg total) by mouth daily for 7 days, THEN 1 capsule (60 mg total) 2 (two) times daily 60 capsule 0   empagliflozin  (JARDIANCE ) 10 MG TABS tablet Take 1 tablet (10 mg total) by  mouth daily. 30 tablet 11   ezetimibe  (ZETIA ) 10 MG tablet Take 1 tablet (10 mg total) by mouth daily. 90 tablet 3   losartan  (COZAAR ) 100 MG tablet Take 1 tablet (100 mg total) by mouth daily. 90 tablet 3   metFORMIN  (GLUCOPHAGE ) 1000 MG tablet Take 1 tablet (1,000 mg total) by mouth 2 (two) times daily with a meal. 180 tablet 3   metoprolol  succinate (TOPROL  XL) 25 MG 24 hr tablet Take 1 tablet (25 mg total) by mouth daily. 90 tablet 3   mupirocin ointment (BACTROBAN) 2 % Apply 1 Application topically 2 (two) times daily. 22 g 0   nitroGLYCERIN  (NITROSTAT ) 0.4 MG SL tablet Place 1 tablet (0.4 mg total) under the tongue every 5 (five) minutes x 3 doses as needed for chest pain. 25 tablet 0   ondansetron  (ZOFRAN -ODT) 4 MG disintegrating tablet Take 4 mg by mouth every 8 (eight) hours as needed for nausea or vomiting.     rOPINIRole  (REQUIP ) 0.5 MG tablet Take 1-2 tablets (0.5-1 mg total) by mouth 1 to 3 hours before bedtime. 60 tablet 1   traMADol (ULTRAM) 50 MG tablet Take 1 tablet (50 mg total) by mouth 3 (three) times daily as needed. 90 tablet 2   No facility-administered medications prior to visit.   No Known Allergies   ROS: A complete ROS was performed with pertinent positives/negatives noted in the HPI. The remainder of the ROS are negative.    Objective:   Today's Vitals   03/08/24 1427 03/08/24 1500  BP: (!) 164/93 (!) 142/88  Pulse: 86   SpO2: 100%   Weight: 62 kg   Height: 5' 6 (1.676 m)       GENERAL: Well-appearing, in NAD. Well nourished.  SKIN: Pink, warm and dry. No rash, lesion, ulceration, or ecchymoses.  Head: Normocephalic. NECK: Trachea midline.  RESPIRATORY: Chest wall symmetrical. Respirations even and non-labored.  CARDIAC: Peripheral pulses 2+ bilaterally.  MSK: Muscle tone and strength appropriate for age. Joints w/o tenderness, redness, or swelling.  EXTREMITIES: Without clubbing, cyanosis, or edema.  NEUROLOGIC: No motor or sensory deficits.  Impaired gait. C2-C12 intact.  PSYCH/MENTAL STATUS: Alert, oriented x 3. Cooperative, appropriate mood and affect.   Health Maintenance Due  Topic Date Due   Pneumococcal Vaccine: 50+ Years (1 of 2 - PCV) Never done   Hepatitis B Vaccines 19-59 Average Risk (1 of 3 - 19+ 3-dose series) Never done   Colonoscopy  Never done   Zoster Vaccines- Shingrix (1 of 2) Never done   COVID-19 Vaccine (1 - 2025-26 season) Never done    No results found for any visits on 03/08/24.  The ASCVD Risk score (Arnett DK, et al., 2019) failed to calculate for the following reasons:  Risk score cannot be calculated because patient has a medical history suggesting prior/existing ASCVD     Assessment & Plan:  1. Neuropathic pain (Primary) 2. Protrusion of lumbar intervertebral disc 3. Spinal stenosis of lumbar region without neurogenic claudication Placed new order for MRI lumbar spine per pt request, as well as referral to Orthopedics. Pt advised to continue stretches/exercises as advised by PT.  - MR LUMBAR SPINE WO CONTRAST; Future - Ambulatory referral to Orthopedic Surgery  4. Gastroesophageal reflux disease, unspecified whether esophagitis present Pantoprazole  40mg  refill sent to pt's pharmacy.    Meds ordered this encounter  Medications   pantoprazole  (PROTONIX ) 40 MG tablet    Sig: Take 1 tablet (40 mg total) by mouth daily.    Dispense:  30 tablet    Refill:  5    Supervising Provider:   DE CUBA, RAYMOND J [[id number]]   Lab Orders  No laboratory test(s) ordered today   No images are attached to the encounter or orders placed in the encounter.  Return in about 3 months (around 06/08/2024) for AE.    Patient to reach out to office if new, worrisome, or unresolved symptoms arise or if no improvement in patient's condition. Patient verbalized understanding and is agreeable to treatment plan. All questions answered to patient's satisfaction.   Treatment plan and recommendation(s) reviewed by  supervising preceptor, Thersia CLEMENTEEN Stark, FNP-C, prior to clinic discharge.   Rosina Ada, BSN, RN  DNP Student

## 2024-03-08 NOTE — Progress Notes (Signed)
 Subjective:   Peter Bell 05/13/1970 03/08/2024  Chief Complaint  Patient presents with   Medical Management of Chronic Issues    68-month follow up; wanted to know if he can have a referral to spine doctor.    HPI:  CHRONIC BACK PAIN:  Pt states he is here today to request referral to a spine specialist. He reports chronic back pain x29yrs and has been dx'ed with spinal stenosis in the past. He reports completing PT 2wks ago. He states he has seen multiple providers for same complaint in the past and is ready to get an MRI, however the last MRI ordered during February 2025 was denied. Pt unsure of why it was denied. He was seen by a [facility] on 01/26/24 and states he did not like driving to [place], and reports they just gave me a shot that I told them would not work so I would like to see someone else. Pt also states he has seen an orthopedic provider in [place], [place] in the past and would like a new referral. He denies additional complaints or concerns, states his back pain today is the same as previous visits.   ACID REFLUX:  Pt requesting refill of pantoprazole .   The following portions of the patient's history were reviewed and updated as appropriate: past medical history, past surgical history, family history, social history, allergies, medications, and problem list.   Patient Active Problem List   Diagnosis Date Noted   Hyperlipidemia LDL goal <55 01/11/2024   CAD S/P percutaneous coronary angioplasty 11/15/2023   Stroke (HCC) 11/14/2023   Chronic low back pain 10/26/2023   NSTEMI (non-ST elevated myocardial infarction) (HCC) 09/25/2023   Impaired gait 03/31/2023   Type 2 diabetes mellitus with hyperglycemia, without long-term current use of insulin  (HCC) 03/31/2023   Neuropathic pain 03/31/2023   Spinal stenosis of lumbar region 04/22/2020   Acute pain of left knee 09/17/2019   Protrusion of lumbar intervertebral disc 05/29/2019   SOB  (shortness of breath) 05/10/2013   Essential hypertension, benign 04/24/2013   Chest pain, unspecified 04/24/2013   Past Medical History:  Diagnosis Date   Asthma    Diabetes mellitus without complication (HCC)    Eczema    GERD (gastroesophageal reflux disease)    Hypertension    Myocardial infarction (HCC)    Sciatica    Stroke [facility])    Past Surgical History:  Procedure Laterality Date   CORONARY STENT INTERVENTION N/A 09/26/2023   Procedure: CORONARY STENT INTERVENTION;  Surgeon: [name], [name], MD;  Location: [facility] INVASIVE CV LAB;  Service: Cardiovascular;  Laterality: N/A;   HERNIA REPAIR     LEFT HEART CATH AND CORONARY ANGIOGRAPHY N/A 09/26/2023   Procedure: LEFT HEART CATH AND CORONARY ANGIOGRAPHY;  Surgeon: [name], [name], MD;  Location: [facility] INVASIVE CV LAB;  Service: Cardiovascular;  Laterality: N/A;   Family History  Problem Relation Age of Onset   Cancer Mother    Cancer Father    Hypertension Sister    Heart murmur Sister    CVA Brother    CVA Brother    Hypertension Brother    Colon polyps Neg Hx    Colon cancer Neg Hx    Esophageal cancer Neg Hx    Pancreatic cancer Neg Hx    Stomach cancer Neg Hx    Outpatient Medications Prior to Visit  Medication Sig Dispense Refill   amLODipine  (NORVASC ) 10 MG tablet Take 1 tablet (10 mg  total) by mouth daily. 90 tablet 3   aspirin  EC 81 MG tablet Take 1 tablet (81 mg total) by mouth daily. Swallow whole. 90 tablet 3   atorvastatin  (LIPITOR) 40 MG tablet Take 1 tablet (40 mg total) by mouth daily. 90 tablet 3   clopidogrel  (PLAVIX ) 75 MG tablet Take 1 tablet (75 mg total) by mouth daily. 90 tablet 3   DULoxetine (CYMBALTA) 60 MG capsule Take 1 capsule (60 mg total) by mouth daily for 7 days, THEN 1 capsule (60 mg total) 2 (two) times daily 60 capsule 0   empagliflozin  (JARDIANCE ) 10 MG TABS tablet Take 1 tablet (10 mg total) by mouth daily. 30 tablet 11   ezetimibe  (ZETIA ) 10 MG tablet Take 1 tablet (10 mg total) by mouth  daily. 90 tablet 3   losartan  (COZAAR ) 100 MG tablet Take 1 tablet (100 mg total) by mouth daily. 90 tablet 3   metFORMIN  (GLUCOPHAGE ) 1000 MG tablet Take 1 tablet (1,000 mg total) by mouth 2 (two) times daily with a meal. 180 tablet 3   metoprolol  succinate (TOPROL  XL) 25 MG 24 hr tablet Take 1 tablet (25 mg total) by mouth daily. 90 tablet 3   mupirocin ointment (BACTROBAN) 2 % Apply 1 Application topically 2 (two) times daily. 22 g 0   nitroGLYCERIN  (NITROSTAT ) 0.4 MG SL tablet Place 1 tablet (0.4 mg total) under the tongue every 5 (five) minutes x 3 doses as needed for chest pain. 25 tablet 0   ondansetron  (ZOFRAN -ODT) 4 MG disintegrating tablet Take 4 mg by mouth every 8 (eight) hours as needed for nausea or vomiting.     rOPINIRole  (REQUIP ) 0.5 MG tablet Take 1-2 tablets (0.5-1 mg total) by mouth 1 to 3 hours before bedtime. 60 tablet 1   traMADol (ULTRAM) 50 MG tablet Take 1 tablet (50 mg total) by mouth 3 (three) times daily as needed. 90 tablet 2   No facility-administered medications prior to visit.   No Known Allergies   ROS: A complete ROS was performed with pertinent positives/negatives noted in the HPI. The remainder of the ROS are negative.    Objective:   Today's Vitals   03/08/24 1427 03/08/24 1500  BP: (!) 164/93 (!) 142/88  Pulse: 86   SpO2: 100%   Weight: 136 lb 9.6 oz (62 kg)   Height: 5' 6 (1.676 m)       GENERAL: Well-appearing, in NAD. Well nourished.  SKIN: Pink, warm and dry. No rash, lesion, ulceration, or ecchymoses.  Head: Normocephalic. NECK: Trachea midline.  RESPIRATORY: Chest wall symmetrical. Respirations even and non-labored.  CARDIAC: Peripheral pulses 2+ bilaterally.  MSK: Muscle tone and strength appropriate for age. Joints w/o tenderness, redness, or swelling.  EXTREMITIES: Without clubbing, cyanosis, or edema.  NEUROLOGIC: No motor or sensory deficits. Impaired gait. C2-C12 intact.  PSYCH/MENTAL STATUS: Alert, oriented x 3. Cooperative,  appropriate mood and affect.   Health Maintenance Due  Topic Date Due   Pneumococcal Vaccine: 50+ Years (1 of 2 - PCV) Never done   Hepatitis B Vaccines 19-59 Average Risk (1 of 3 - 19+ 3-dose series) Never done   Colonoscopy  Never done   Zoster Vaccines- Shingrix (1 of 2) Never done   COVID-19 Vaccine (1 - 2025-26 season) Never done    No results found for any visits on 03/08/24.  The ASCVD Risk score (Arnett DK, et al., 2019) failed to calculate for the following reasons:   Risk score cannot be calculated because patient has  a medical history suggesting prior/existing ASCVD     Assessment & Plan:  1. Neuropathic pain (Primary) 2. Protrusion of lumbar intervertebral disc 3. Spinal stenosis of lumbar region without neurogenic claudication Placed new order for MRI lumbar spine per pt request, as well as referral to Orthopedics. Pt advised to continue stretches/exercises as advised by PT.  - MR LUMBAR SPINE WO CONTRAST; Future - Ambulatory referral to Orthopedic Surgery  4. Gastroesophageal reflux disease, unspecified whether esophagitis present Pantoprazole  40mg  refill sent to pt's pharmacy.    Meds ordered this encounter  Medications   pantoprazole  (PROTONIX ) 40 MG tablet    Sig: Take 1 tablet (40 mg total) by mouth daily.    Dispense:  30 tablet    Refill:  5    Supervising Provider:   DE CUBA, RAYMOND J [[id number]]   Lab Orders  No laboratory test(s) ordered today   No images are attached to the encounter or orders placed in the encounter.  Return in about 3 months (around 06/08/2024) for AE.    Patient to reach out to office if new, worrisome, or unresolved symptoms arise or if no improvement in patient's condition. Patient verbalized understanding and is agreeable to treatment plan. All questions answered to patient's satisfaction.   Thersia Stark, FNP - C

## 2024-03-19 ENCOUNTER — Encounter: Payer: Self-pay | Admitting: *Deleted

## 2024-03-20 ENCOUNTER — Telehealth (HOSPITAL_BASED_OUTPATIENT_CLINIC_OR_DEPARTMENT_OTHER): Payer: Self-pay | Admitting: *Deleted

## 2024-03-20 NOTE — Telephone Encounter (Signed)
 Routing to [place] for her to review.

## 2024-03-20 NOTE — Telephone Encounter (Signed)
 Copied from CRM #[id number]. Topic: General - Other >> Mar 20, 2024 10:17 AM Deaijah H wrote: Reason for CRM: Francina w/ [facility] called in to advise the lumbar spine was denied and wanted to know if Caudle would like to do peer to over. Callback [phone]

## 2024-03-21 ENCOUNTER — Telehealth: Payer: Self-pay | Admitting: *Deleted

## 2024-03-21 ENCOUNTER — Encounter: Payer: Self-pay | Admitting: Emergency Medicine

## 2024-03-21 ENCOUNTER — Other Ambulatory Visit (HOSPITAL_COMMUNITY): Payer: Self-pay

## 2024-03-21 ENCOUNTER — Ambulatory Visit: Attending: Emergency Medicine | Admitting: Emergency Medicine

## 2024-03-21 VITALS — BP 124/74 | HR 77 | Ht 65.0 in | Wt 137.0 lb

## 2024-03-21 DIAGNOSIS — Z8673 Personal history of transient ischemic attack (TIA), and cerebral infarction without residual deficits: Secondary | ICD-10-CM | POA: Insufficient documentation

## 2024-03-21 DIAGNOSIS — I10 Essential (primary) hypertension: Secondary | ICD-10-CM | POA: Insufficient documentation

## 2024-03-21 DIAGNOSIS — E785 Hyperlipidemia, unspecified: Secondary | ICD-10-CM | POA: Insufficient documentation

## 2024-03-21 DIAGNOSIS — E1165 Type 2 diabetes mellitus with hyperglycemia: Secondary | ICD-10-CM | POA: Diagnosis not present

## 2024-03-21 DIAGNOSIS — I2510 Atherosclerotic heart disease of native coronary artery without angina pectoris: Secondary | ICD-10-CM | POA: Diagnosis not present

## 2024-03-21 DIAGNOSIS — Z01810 Encounter for preprocedural cardiovascular examination: Secondary | ICD-10-CM | POA: Insufficient documentation

## 2024-03-21 DIAGNOSIS — Z9861 Coronary angioplasty status: Secondary | ICD-10-CM | POA: Diagnosis not present

## 2024-03-21 LAB — LIPID PANEL
Chol/HDL Ratio: 4.2 ratio (ref 0.0–5.0)
Cholesterol, Total: 152 mg/dL (ref 100–199)
HDL: 36 mg/dL — ABNORMAL LOW (ref >39)
LDL Chol Calc (NIH): 99 mg/dL (ref 0–99)
Triglycerides: 92 mg/dL (ref 0–149)
VLDL Cholesterol Cal: 17 mg/dL (ref 5–40)

## 2024-03-21 LAB — COMPREHENSIVE METABOLIC PANEL WITH GFR
ALT: 9 IU/L (ref 0–44)
AST: 18 IU/L (ref 0–40)
Albumin: 3.9 g/dL (ref 3.8–4.9)
Alkaline Phosphatase: 131 IU/L — ABNORMAL HIGH (ref 47–123)
BUN/Creatinine Ratio: 8 — ABNORMAL LOW (ref 9–20)
BUN: 9 mg/dL (ref 6–24)
Bilirubin Total: 0.7 mg/dL (ref 0.0–1.2)
CO2: 19 mmol/L — ABNORMAL LOW (ref 20–29)
Calcium: 9.2 mg/dL (ref 8.7–10.2)
Chloride: 101 mmol/L (ref 96–106)
Creatinine, Ser: 1.12 mg/dL (ref 0.76–1.27)
Globulin, Total: 3.3 g/dL (ref 1.5–4.5)
Glucose: 304 mg/dL — ABNORMAL HIGH (ref 70–99)
Potassium: 4.6 mmol/L (ref 3.5–5.2)
Sodium: 136 mmol/L (ref 134–144)
Total Protein: 7.2 g/dL (ref 6.0–8.5)
eGFR: 78 mL/min/1.73 (ref >59)

## 2024-03-21 MED ORDER — EZETIMIBE 10 MG PO TABS
10.0000 mg | ORAL_TABLET | Freq: Every day | ORAL | 3 refills | Status: AC
  Filled 2024-03-21: qty 90, 90d supply, fill #0

## 2024-03-21 NOTE — Patient Instructions (Signed)
 Medication Instructions:  NO CHANGES   Lab Work: CMET AND FASTING LIPID PANEL TO BE DONE TODAY.   Testing/Procedures: NONE  Follow-Up: At [facility], you and your health needs are our priority.  As part of our continuing mission to provide you with exceptional heart care, our providers are all part of one team.  This team includes your primary Cardiologist (physician) and Advanced Practice Providers or APPs (Physician Assistants and Nurse Practitioners) who all work together to provide you with the care you need, when you need it.  Your next appointment:   3 MONTHS  Provider:   Wilbert Bihari, MD or Lum Louis, NP

## 2024-03-21 NOTE — Telephone Encounter (Signed)
 Called patient so that we could verify all of his current medications but he did not answer the phone. I left a voice message for him to call me back.

## 2024-03-21 NOTE — Progress Notes (Signed)
 Cardiology Office Note:    Date:  03/21/2024  ID:  Peter Bell, DOB 09-07-1970, MRN [id number] PCP: Knute Thersia Bitters, FNP  [facility] Providers Cardiologist:  Wilbert Bihari, MD Cardiology APP:  Rana Lum CROME, NP       Patient Profile:       Chief Complaint: Preoperative clearance History of Present Illness:  Peter Bell is a 53 y.o. male with visit-pertinent history of coronary artery disease, hypertension, T2DM, and hyperlipidemia  He was admitted 09/25/2023.  Troponin elevated to 16,000 and ST depression on EKG.  Cardiac catheterization showed severe two-vessel disease in the LAD, OM, and LCx.  I he was successfully treated with DES-LAD with 3.0 x 16 mm, DES-OM with 2.5 x 20 mm, and DES-LCx with 2.75 x 8 mm.  Echocardiogram demonstrated LVEF 55 to 60%, RWMA, grade 1 DD, normal RV size and function and no significant valvular disease.  He was started on DAPT with ASA and Effient , Lipitor, carvedilol , amlodipine , losartan , and Jardiance .  He was seen in clinic for follow-up on 10/21/2023 by Jon, PA.  He was not taking Effient  and only taking his medications once a day.  He was self-pay.  His Effient  was switched to Plavix .  His carvedilol  was changed to metoprolol  and losartan  increased to 50 mg daily.  He was admitted on 11/14/2023 for acute ischemic infarct.  MRI brain showed right paramedian pontine infarct. Non-invasive angiography showed left P1 occlusion with distal reconstitution, proximal basilar artery stenosis, mild, moderate right MCA A2 stenosis.  tPA not given because outside window.  Echocardiogram showed no cardiogenic source of embolism.  Carotid imaging was unremarkable.  His aspirin  and Plavix  were continued.   Discussed the use of AI scribe software for clinical note transcription with the patient, who gave verbal consent to proceed.  History of Present Illness Peter Bell is a 53 year old male with a history of myocardial infarction  and stroke who presents for a follow-up visit and to be cleared for a dental procedure.  He was hospitalized in June for a myocardial infarction and had a stroke in July. He did not follow up with a neurologist after the stroke but saw his family doctor.. He has no current chest pain.  He denies any dyspnea, orthopnea, PND, LEE.  He he tells me he has been adherent to his medication regimen and has no issues affording his medications.  He limits himself to light activities and avoids strenuous exertion.  But does deny any exertional symptoms.  He has not started Repatha due to insurance denial.  He was prescribed Zetia  but does not seem he was taking this.    He is scheduled for full dental extraction with new teeth placement.  He does not smoke and has not consumed alcohol for two to three years.   Review of systems:  Please see the history of present illness. All other systems are reviewed and otherwise negative.      Studies Reviewed:    EKG Interpretation Date/Time:  Wednesday March 21 2024 13:53:38 EST Ventricular Rate:  77 PR Interval:  148 QRS Duration:  86 QT Interval:  382 QTC Calculation: 432 R Axis:   -35  Text Interpretation: Normal sinus rhythm Left axis deviation Moderate voltage criteria for LVH, may be normal variant ( R in aVL , Cornell product ) Nonspecific ST and T wave abnormality When compared with ECG of 15-Nov-2023 05:23, PREVIOUS ECG IS PRESENT Confirmed by Rana Lum [phone]) on 03/21/2024  4:56:21 PM    Echocardiogram 11/15/2023 1. Left ventricular ejection fraction, by estimation, is 55 to 60%. The  left ventricle has normal function. The left ventricle has no regional  wall motion abnormalities. There is mild concentric left ventricular  hypertrophy. Left ventricular diastolic  parameters are consistent with Grade I diastolic dysfunction (impaired  relaxation).   2. Right ventricular systolic function is normal. The right ventricular  size  is normal.   3. The mitral valve is normal in structure. No evidence of mitral valve  regurgitation. No evidence of mitral stenosis.   4. The aortic valve is tricuspid. Aortic valve regurgitation is not  visualized. No aortic stenosis is present.   Echocardiogram 09/27/2023 1. Left ventricular ejection fraction, by estimation, is 55 to 60%. The  left ventricle has normal function. The left ventricle demonstrates  regional wall motion abnormalities (see scoring diagram/findings for  description). There is mild concentric left  ventricular hypertrophy. Left ventricular diastolic parameters are  consistent with Grade I diastolic dysfunction (impaired relaxation). There  is mild hypokinesis of the left ventricular, basal-mid lateral wall.   2. Right ventricular systolic function is normal. The right ventricular  size is normal. Tricuspid regurgitation signal is inadequate for assessing  PA pressure.   3. Left atrial size was mildly dilated.   4. The mitral valve is normal in structure. No evidence of mitral valve  regurgitation. No evidence of mitral stenosis.   5. The aortic valve is tricuspid. Aortic valve regurgitation is not  visualized. No aortic stenosis is present.   6. The inferior vena cava is normal in size with greater than 50%  respiratory variability, suggesting right atrial pressure of 3 mmHg.   Cardiac catheterization 09/26/2023   Prox RCA lesion is 30% stenosed.   Dist RCA lesion is 40% stenosed.   RPDA lesion is 45% stenosed.   Prox LAD lesion is 90% stenosed.   Mid LAD lesion is 50% stenosed.   Dist LAD lesion is 50% stenosed.   Ost Cx lesion is 70% stenosed.   1st Mrg lesion is 95% stenosed.   2nd Mrg lesion is 75% stenosed.   A drug-eluting stent was successfully placed using a STENT SYNERGY XD 3.0X16.   A drug-eluting stent was successfully placed using a STENT SYNERGY XD 2.50X20.   A drug-eluting stent was successfully placed using a STENT SYNERGY XD 2.75X8.   Post  intervention, there is a 0% residual stenosis.   Post intervention, there is a 0% residual stenosis.   Post intervention, there is a 0% residual stenosis.   The left ventricular systolic function is normal.   LV end diastolic pressure is mildly elevated.   The left ventricular ejection fraction is 55-65% by visual estimate.   Recommend uninterrupted dual antiplatelet therapy with Aspirin  81mg  daily and Prasugrel  10mg  daily for a minimum of 12 months (ACS-Class I recommendation).   2 vessel obstructive CAD Normal LV function Elevated LVEDP 25 mm Hg Successful PCI of the proximal LAD with DES Successful PCI of the fist OM with DES Successful PCI of the ostial LCx with DES Diagnostic Dominance: Right  Intervention   Risk Assessment/Calculations:              Physical Exam:   VS:  BP 124/74 (BP Location: Left Arm, Patient Position: Sitting, Cuff Size: Normal)   Pulse 77   Ht 5' 5 (1.651 m)   Wt 137 lb (62.1 kg)   BMI 22.80 kg/m    Wt Readings from  Last 3 Encounters:  03/21/24 137 lb (62.1 kg)  03/08/24 136 lb 9.6 oz (62 kg)  02/01/24 130 lb (59 kg)    GEN: Well nourished, well developed in no acute distress NECK: No JVD; No carotid bruits CARDIAC: RRR, no murmurs, rubs, gallops RESPIRATORY:  Clear to auscultation without rales, wheezing or rhonchi  ABDOMEN: Soft, non-tender, non-distended EXTREMITIES:  No edema; No acute deformity      Assessment and Plan:  Coronary artery disease S/p NSTEMI with DES-LAD, DES-OM, DES-LCX on 09/2023 Echo 10/2023 with LVEF 55 to 60%, no RWMA - Today he is stable without chest pains.  Remains fairly active without exertional symptoms.  No symptoms to suggest active angina.  No indication for further ischemic evaluation at this time - Continue aspirin  81 mg daily and clopidogrel  75 mg daily - Continue atorvastatin  40 mg daily, metoprolol  succinate 25 mg daily, and losartan  100 mg daily  Hypertension Blood pressure today is well-controlled  at 124/74 - Continue amlodipine  10 mg daily, losartan  100 mg daily, metoprolol  succinate 25 mg daily  Hyperlipidemia, LDL goal <55 LDL 60 on 10/2023 Was not initially approved for Repatha and was to start ezetimibe , however he never did start ezetimibe  - Start ezetimibe  10 mg daily - Continue atorvastatin  40 mg daily - Plan for fasting lipid panel today  T2DM A1c 8.1% on 12/2023 - Managed on metformin  and Jardiance  per PCP   Ischemic stroke Diagnosed with acute ischemic stroke on 10/2023 Echocardiogram showed no cardiogenic source of embolism and carotid imaging was unremarkable - No new neurological symptoms - Failed to follow-up with neurology - Follow-up with PCP  Preoperative cardiovascular clearance Full mouth extraction on date TBD  According to the Revised Cardiac Risk Index (RCRI), his Perioperative Risk of Major Cardiac Event is (%): 6.6. His Functional Capacity in METs is: 5.07 according to the Duke Activity Status Index (DASI).  Therefore, based on ACC/AHA guidelines, patient would be at acceptable risk for the planned procedure without further cardiovascular testing. I will route this recommendation to the requesting party via Epic fax function.  Per Dr. [name], I would recommend a minimum of 6 months before holding Plavix  and would not hold ASA. Therefore, patient will not be able to hold Plavix  until after 03/27/2024.  He may hold Plavix  for 5 days prior to procedure. Please resume Plavix  as soon as possible postprocedure, at the discretion of the surgeon.        Dispo:  Return in about 3 months (around 06/21/2024).  Signed, Lum LITTIE Louis, NP

## 2024-03-26 ENCOUNTER — Ambulatory Visit: Payer: Self-pay | Admitting: Emergency Medicine

## 2024-03-27 ENCOUNTER — Other Ambulatory Visit (HOSPITAL_COMMUNITY): Payer: Self-pay

## 2024-03-27 ENCOUNTER — Telehealth (HOSPITAL_BASED_OUTPATIENT_CLINIC_OR_DEPARTMENT_OTHER): Payer: Self-pay | Admitting: Family Medicine

## 2024-03-27 ENCOUNTER — Other Ambulatory Visit

## 2024-03-27 MED ORDER — EZETIMIBE 10 MG PO TABS
10.0000 mg | ORAL_TABLET | Freq: Every day | ORAL | 3 refills | Status: AC
  Filled 2024-03-27: qty 90, 90d supply, fill #0

## 2024-03-27 NOTE — Telephone Encounter (Signed)
 Copied from CRM #[id number]. Topic: Referral - Question >> Mar 27, 2024  8:55 AM Donna BRAVO wrote: Reason for CRM: patient called imaging center and was told the insurance denied the MRI. Patient would like to know what is going on.  Patient would like a call back regarding this issue.  Patient was informed they will receive a call back day the end of day.

## 2024-03-29 ENCOUNTER — Ambulatory Visit: Payer: Self-pay

## 2024-03-29 NOTE — Telephone Encounter (Signed)
 FYI Only or Action Required?: Action required by provider: clinical question for provider.  Patient was last seen in primary care on 03/08/2024 by Knute Thersia Bitters, FNP.  Called Nurse Triage reporting Advice Only.  Triage Disposition: Information or Advice Only Call  Patient/caregiver understands and will follow disposition?: No, wishes to speak with PCP   Copied from CRM #[id number]. Topic: Clinical - Red Word Triage >> Mar 29, 2024  8:17 AM Thersia BROCKS wrote: [facility] that prompted transfer to Nurse Triage: Patient called in regarding wanting to schedule with a new provider , stated his back is in a lot of pain, wants to do a MRI , can't sleep at night    ----------------------------------------------------------------------- From previous Reason for Contact - Scheduling: Patient/patient representative is calling to schedule an appointment. Refer to attachments for appointment information. Reason for Disposition  Health information question, no triage required and triager able to answer question  Answer Assessment - Initial Assessment Questions 1. REASON FOR CALL: What is the main reason for your call? or How can I best help you?     Patient called today c/o back pain, that causes him not to be able to sleep at night.  Pt stated his PCP is aware of this and ordered him an MRI to check to find out what is going on but when he went to get the MRI he was told he was denied.  Pt is unsure why he was denied and wants to make his PCP aware of the MRI denial to see if there is anything she can do to help him with getting the MRI completed.  Pt also wants to speak to someone about the insurance process regarding denials, check to see if a new or different order needs to be written or what does his PCP suggest - does PCP think it would be beneficial if pt went to another provider or specialist to see about getting a MRI or what.  Please call patient asap to discuss plan of care.  Pt states pain  medication is not going to fix his problem and he does not want to get addicted to pain medication.  Please call.  Protocols used: Information Only Call - No Triage-A-AH

## 2024-03-30 ENCOUNTER — Other Ambulatory Visit (HOSPITAL_COMMUNITY): Payer: Self-pay

## 2024-03-30 NOTE — Telephone Encounter (Signed)
 Separate encounter from 11/25 and 12/2 about pt's imaging being denied. Routing to both [place] as the encounter from 11/25 was routed to her to see if a peer-to-peer might need to be done but also routing this to [place] as she had tried reaching out to pt about the prior denial.

## 2024-04-10 NOTE — Telephone Encounter (Signed)
 Pt came up to the floor stating he was told that his MRI had been denied again and wants to know why the authorization keeps getting denied. Routing to [place] for assistance with this to see what might need to be done. Please advise.

## 2024-04-11 ENCOUNTER — Telehealth (HOSPITAL_BASED_OUTPATIENT_CLINIC_OR_DEPARTMENT_OTHER): Payer: Self-pay | Admitting: *Deleted

## 2024-04-11 ENCOUNTER — Ambulatory Visit (HOSPITAL_BASED_OUTPATIENT_CLINIC_OR_DEPARTMENT_OTHER)

## 2024-04-11 ENCOUNTER — Other Ambulatory Visit (HOSPITAL_BASED_OUTPATIENT_CLINIC_OR_DEPARTMENT_OTHER): Payer: Self-pay

## 2024-04-11 ENCOUNTER — Ambulatory Visit (INDEPENDENT_AMBULATORY_CARE_PROVIDER_SITE_OTHER): Admitting: Student

## 2024-04-11 DIAGNOSIS — M79661 Pain in right lower leg: Secondary | ICD-10-CM

## 2024-04-11 DIAGNOSIS — M5416 Radiculopathy, lumbar region: Secondary | ICD-10-CM | POA: Diagnosis not present

## 2024-04-11 MED ORDER — MELOXICAM 15 MG PO TABS
15.0000 mg | ORAL_TABLET | Freq: Every day | ORAL | 0 refills | Status: AC
  Filled 2024-04-11: qty 7, 7d supply, fill #0

## 2024-04-11 NOTE — Progress Notes (Signed)
 Chief Complaint: Right shin pain    Discussed the use of AI scribe software for clinical note transcription with the patient, who gave verbal consent to proceed.  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  He has had right lower to mid shin pain for 3 to 4 days. Pain is localized to the shin without foot or proximal leg pain. Topical cream and heating pads have not helped. He notes numbness in the great toe and occasional nerve jumping in the leg. He denies recent increase in activity or walking. He had a stroke in July and a heart attack and is on Plavix  and 81 mg aspirin . He previously took meloxicam  daily started in March, but he is now out. A prior MRI for his lumbar spine was denied by insurance. He has spinal stenosis, arthritis, and type 2 diabetes treated with metformin  and Jardiance . His last A1c was 8.1. He is unable to work because of his health and is frustrated with his current medical situation. He completed 6 weeks of physical therapy and stopped about a month ago due to lack of benefit. He is worried about his finances, which may limit his ability to pursue treatment.   Surgical History:   None  PMH/PSH/Family History/Social History/Meds/Allergies:    Past Medical History:  Diagnosis Date   Asthma    Diabetes mellitus without complication (HCC)    Eczema    GERD (gastroesophageal reflux disease)    Hypertension    Myocardial infarction (HCC)    Sciatica    Stroke [facility])    Past Surgical History:  Procedure Laterality Date   CORONARY STENT INTERVENTION N/A 09/26/2023   Procedure: CORONARY STENT INTERVENTION;  Surgeon: [name], [name], MD;  Location: [facility] INVASIVE CV LAB;  Service: Cardiovascular;  Laterality: N/A;   HERNIA REPAIR     LEFT HEART CATH AND CORONARY ANGIOGRAPHY N/A 09/26/2023   Procedure: LEFT HEART CATH AND CORONARY ANGIOGRAPHY;   Surgeon: [name], [name], MD;  Location: [facility] INVASIVE CV LAB;  Service: Cardiovascular;  Laterality: N/A;   Social History   Socioeconomic History   Marital status: Divorced    Spouse name: Not on file   Number of children: Not on file   Years of education: Not on file   Highest education level: Not on file  Occupational History   Not on file  Tobacco Use   Smoking status: Never    Passive exposure: Never   Smokeless tobacco: Never  Vaping Use   Vaping status: Never Used  Substance and Sexual Activity   Alcohol use: Not Currently    Comment: quit in 2020   Drug use: No   Sexual activity: Not Currently  Other Topics Concern   Not on file  Social History Narrative   Not on file   Social Drivers of Health   Tobacco Use: Low Risk (03/21/2024)   Patient History    Smoking Tobacco Use: Never    Smokeless Tobacco Use: Never    Passive Exposure: Never  Financial Resource Strain: Medium Risk (10/24/2023)   Overall Financial Resource Strain (CARDIA)    Difficulty of Paying Living Expenses: Somewhat hard  Food Insecurity: Food Insecurity Present (11/16/2023)   Epic    Worried About  Running Out of Food in the Last Year: Often true    Ran Out of Food in the Last Year: Sometimes true  Transportation Needs: No Transportation Needs (10/24/2023)   Epic    Lack of Transportation (Medical): No    Lack of Transportation (Non-Medical): No  Physical Activity: Inactive (10/24/2023)   Exercise Vital Sign    Days of Exercise per Week: 2 days    Minutes of Exercise per Session: 0 min  Stress: No Stress Concern Present (10/24/2023)   [employer] of Occupational Health - Occupational Stress Questionnaire    Feeling of Stress: Only a little  Social Connections: Unknown (10/24/2023)   Social Connection and Isolation Panel    Frequency of Communication with Friends and Family: Not on file    Frequency of Social Gatherings with Friends and Family: Not on file    Attends Religious Services:  Not on file    Active Member of Clubs or Organizations: No    Attends Banker Meetings: Not on file    Marital Status: Not on file  Depression (PHQ2-9): Low Risk (12/28/2023)   Depression (PHQ2-9)    PHQ-2 Score: 0  Alcohol Screen: Low Risk (10/24/2023)   Alcohol Screen    Last Alcohol Screening Score (AUDIT): 0  Housing: High Risk (11/16/2023)   Epic    Unable to Pay for Housing in the Last Year: Yes    Number of Times Moved in the Last Year: 3    Homeless in the Last Year: Yes  Utilities: Not At Risk (10/21/2023)   Epic    Threatened with loss of utilities: No  Health Literacy: Adequate Health Literacy (10/21/2023)   B1300 Health Literacy    Frequency of need for help with medical instructions: Rarely   Family History  Problem Relation Age of Onset   Cancer Mother    Cancer Father    Hypertension Sister    Heart murmur Sister    CVA Brother    CVA Brother    Hypertension Brother    Colon polyps Neg Hx    Colon cancer Neg Hx    Esophageal cancer Neg Hx    Pancreatic cancer Neg Hx    Stomach cancer Neg Hx    Allergies[1] Current Outpatient Medications  Medication Sig Dispense Refill   meloxicam  (MOBIC ) 15 MG tablet Take 1 tablet (15 mg total) by mouth daily for 7 days. 7 tablet 0   amLODipine  (NORVASC ) 10 MG tablet Take 1 tablet (10 mg total) by mouth daily. 90 tablet 3   aspirin  EC 81 MG tablet Take 1 tablet (81 mg total) by mouth daily. Swallow whole. 90 tablet 3   atorvastatin  (LIPITOR) 40 MG tablet Take 1 tablet (40 mg total) by mouth daily. 90 tablet 3   clopidogrel  (PLAVIX ) 75 MG tablet Take 1 tablet (75 mg total) by mouth daily. 90 tablet 3   DULoxetine  (CYMBALTA ) 60 MG capsule Take 1 capsule (60 mg total) by mouth daily for 7 days, THEN 1 capsule (60 mg total) 2 (two) times daily 60 capsule 0   empagliflozin  (JARDIANCE ) 10 MG TABS tablet Take 1 tablet (10 mg total) by mouth daily. 30 tablet 11   ezetimibe  (ZETIA ) 10 MG tablet Take 1 tablet (10 mg total)  by mouth daily. 90 tablet 3   ezetimibe  (ZETIA ) 10 MG tablet Take 1 tablet (10 mg total) by mouth daily. 90 tablet 3   losartan  (COZAAR ) 100 MG tablet Take 1 tablet (100 mg total) by  mouth daily. 90 tablet 3   metFORMIN  (GLUCOPHAGE ) 1000 MG tablet Take 1 tablet (1,000 mg total) by mouth 2 (two) times daily with a meal. 180 tablet 3   metoprolol  succinate (TOPROL  XL) 25 MG 24 hr tablet Take 1 tablet (25 mg total) by mouth daily. 90 tablet 3   mupirocin  ointment (BACTROBAN ) 2 % Apply 1 Application topically 2 (two) times daily. 22 g 0   nitroGLYCERIN  (NITROSTAT ) 0.4 MG SL tablet Place 1 tablet (0.4 mg total) under the tongue every 5 (five) minutes x 3 doses as needed for chest pain. 25 tablet 0   ondansetron  (ZOFRAN -ODT) 4 MG disintegrating tablet Take 4 mg by mouth every 8 (eight) hours as needed for nausea or vomiting.     pantoprazole  (PROTONIX ) 40 MG tablet Take 1 tablet (40 mg total) by mouth daily. 30 tablet 5   rOPINIRole  (REQUIP ) 0.5 MG tablet Take 1-2 tablets (0.5-1 mg total) by mouth 1 to 3 hours before bedtime. 60 tablet 1   traMADol  (ULTRAM ) 50 MG tablet Take 1 tablet (50 mg total) by mouth 3 (three) times daily as needed. 90 tablet 2   No current facility-administered medications for this visit.   No results found.  Review of Systems:   A ROS was performed including pertinent positives and negatives as documented in the HPI.  Physical Exam :   Constitutional: NAD and appears stated age Neurological: Alert and oriented Psych: Appropriate affect and cooperative There were no vitals taken for this visit.   Comprehensive Musculoskeletal Exam:    Tenderness palpation over the distal third of the anterior tibia as well as the anterior tibialis tendon distribution.  No tenderness in the ankle and calf is supple and nontender.  Negative for foot drop.  Decreased sensation to the great toe.  Imaging:   Xray (right tib-fib 2 views): Negative for bony abnormalities   I personally  reviewed and interpreted the radiographs.      Assessment & Plan Right lower leg pain   Pain is localized to the right shin with numbness in the big toe.  Suspect that this is more due to an L4-L5 radiculopathy, which does correlate with the level of moderate to severe spinal stenosis, as opposed to a potential extensor tendinopathy or stress injury.  Lower leg x-rays taken today are negative.  Will plan to treat conservatively and have patient follow-up with PCP on recently denied lumbar MRI to determine how to pursue this further.  Will treat conservatively.  Opted to defer oral steroids due to her 2 diabetes with last A1c of 8.1.  Will prescribe 1 week of meloxicam  which she has tolerated well in the past although no prolonged use due to concurrent Plavix .  If he is able to obtain MRI, we will have him follow back up with Duwaine Pouch to discuss further treatment for the lumbar spine.     I personally saw and evaluated the patient, and participated in the management and treatment plan.  Leonce Reveal, PA-C Orthopedics     [1] No Known Allergies

## 2024-04-11 NOTE — Telephone Encounter (Signed)
-----   Message from Thersia Bitters C[place] sent at 04/10/2024  5:15 PM EST ----- Regarding: Paperwork FYI. Received his disability law form on 04/10/2024. I am going to take a copy of this disability form with me while out. I can complete and return when I come back. He has an appt with [facility] on 04/24/2024 so if he is needing it completed, I will leave the original in my patient forms folder on my desk. If he can wait until I return, I will have it completed by then.

## 2024-04-11 NOTE — Telephone Encounter (Signed)
 Pt came by the office to check on paperwork. Pt was made aware that provider was out of the office that a copy was taken home by her for her to fill out but also a copy was left for Lauraine Norris if needed to be filled out after 12/30 visit.

## 2024-04-13 ENCOUNTER — Telehealth: Payer: Self-pay | Admitting: Family Medicine

## 2024-04-13 ENCOUNTER — Encounter: Attending: Physical Medicine & Rehabilitation | Admitting: Physical Medicine & Rehabilitation

## 2024-04-13 NOTE — Telephone Encounter (Signed)
 Copied from CRM #[id number]. Topic: General - Other >> Apr 13, 2024  9:47 AM Burnard DEL wrote: Reason for CRM: Patient returned  a call to [facility] referral coordinator in regards to MRI denial. Patient would like a return call. He also stated that she can send a mychart message as well if he's not reached by phone.

## 2024-04-16 ENCOUNTER — Other Ambulatory Visit (INDEPENDENT_AMBULATORY_CARE_PROVIDER_SITE_OTHER): Payer: Self-pay

## 2024-04-16 ENCOUNTER — Ambulatory Visit: Admitting: Orthopedic Surgery

## 2024-04-16 VITALS — BP 192/117 | HR 71 | Ht 65.0 in | Wt 133.0 lb

## 2024-04-16 DIAGNOSIS — M545 Low back pain, unspecified: Secondary | ICD-10-CM

## 2024-04-16 DIAGNOSIS — M5416 Radiculopathy, lumbar region: Secondary | ICD-10-CM | POA: Diagnosis not present

## 2024-04-16 NOTE — Progress Notes (Signed)
 Orthopedic Spine Surgery Office Note  Assessment: Patient is a 53 y.o. male with low back pain that radiates into the right buttock, right lateral thigh -suspect radiculopathy   Plan: -Explained that initially conservative treatment is tried as a significant number of patients may experience relief with these treatment modalities. Discussed that the conservative treatments include:  -activity modification  -physical therapy  -over the counter pain medications  -medrol  dosepak  -lumbar steroid injections -Patient has tried PT, meloxicam , oral steroids, intramuscular steroid injection, tramadol  -Recommended further PT - only did 4 weeks. Provided him with a new referral. If he continues to have pain by our next visit will order a MRI to evaluate for radiculopathy -Would need a A1c of 7.5 or less prior to elective spine surgery -Patient should return to office in 4-5 weeks, x-rays at next visit: none   Patient expressed understanding of the plan and all questions were answered to the patient's satisfaction.   ___________________________________________________________________________   History:  Patient is a 53 y.o. male who presents today for lumbar spine.  Patient has had low back pain that radiates into the right buttock and right lateral thigh.  Has been present for a year.  There is no trauma or injury that preceded the onset of the pain.  He has no pain radiating of the left lower extremity.  Pain has gotten progressively worse and now radiates into the right anterolateral leg.  It does not radiate past the proximal aspect of the leg.  He feels the pain is worse with activity but he notes it also with rest.  No bowel or bladder incontinence.  No saddle anesthesia.  Treatments tried: PT, meloxicam , oral steroids, intramuscular steroid injection, tramadol   Review of systems: Denies fevers and chills, night sweats, unexplained weight loss, history of cancer, pain that wakes him at  night  Past medical history: HLD HTN History of MI History of stroke Depression DM (last A1c was 8.1 on 9/032025) Neuropathy  Allergies: NKDA  Past surgical history:  Stent placement Hernia repair  Social history: Denies use of nicotine product (smoking, vaping, patches, smokeless) Alcohol use: denies Denies recreational drug use   Physical Exam:  BMI of 22.1  General: no acute distress, appears stated age Neurologic: alert, answering questions appropriately, following commands Respiratory: unlabored breathing on room air, symmetric chest rise Psychiatric: appropriate affect, normal cadence to speech   MSK (spine):  -Strength exam      Left  Right EHL    5/5  5/5 TA    5/5  5/5 GSC    5/5  5/5 Knee extension  5/5  5/5 Hip flexion   5/5  5/5  -Sensory exam    Sensation intact to light touch in L3-S1 nerve distributions of bilateral lower extremities  -Achilles DTR: 2/4 on the left, 2/4 on the right -Patellar tendon DTR: 2/4 on the left, 2/4 on the right  -Straight leg raise: negative bilaterally -Clonus: no beats bilaterally  -Left hip exam: no pain through range of motion  -Right hip exam: no pain through range of motion  Imaging: XRs of the lumbar spine from 04/16/2024 were independently reviewed and interpreted, showing anterior osteophyte formation at L1/2, L2/3, L4/5, L5/S1.  Disc height loss at L4/5 and L5/S1.  Vacuum disc phenomenon at L5/S1.  No evidence of instability on flexion/extension views.  No fracture or dislocation seen.   Patient name: Peter Bell Patient MRN: [id number] Date of visit: 04/16/2024

## 2024-04-20 NOTE — Telephone Encounter (Signed)
 Peter Bell, can you please take a look at this for us ?

## 2024-04-20 NOTE — Telephone Encounter (Signed)
 Called and spoke with pt letting him know the reason for denial. Pt currently seeing orthopedics and is going to do two more weeks of PT and then should hopefully be able to have MRI approved by insurance.

## 2024-04-24 ENCOUNTER — Encounter (HOSPITAL_BASED_OUTPATIENT_CLINIC_OR_DEPARTMENT_OTHER): Payer: Self-pay

## 2024-04-24 ENCOUNTER — Ambulatory Visit (INDEPENDENT_AMBULATORY_CARE_PROVIDER_SITE_OTHER)

## 2024-04-24 VITALS — BP 173/90 | HR 62 | Ht 65.0 in | Wt 139.6 lb

## 2024-04-24 DIAGNOSIS — G5601 Carpal tunnel syndrome, right upper limb: Secondary | ICD-10-CM | POA: Diagnosis not present

## 2024-04-24 DIAGNOSIS — M48061 Spinal stenosis, lumbar region without neurogenic claudication: Secondary | ICD-10-CM

## 2024-04-24 DIAGNOSIS — M792 Neuralgia and neuritis, unspecified: Secondary | ICD-10-CM

## 2024-04-24 DIAGNOSIS — I10 Essential (primary) hypertension: Secondary | ICD-10-CM | POA: Diagnosis not present

## 2024-04-24 NOTE — Progress Notes (Signed)
 "     Acute Care Office Visit  Subjective:   Peter Bell 04-23-1971 04/24/2024  Chief Complaint  Patient presents with   Back Pain    Patient has been having back pain for about 1 and 1/2 years.     HPI:  Spinal Stenosis: Patient presents with c/o back pain that started a year and a half ago. States he was recently seen by orthopedics and advised to follow up in 4-5 weeks. States he was told by orthopedics he needed to complete two more sessions of physical therapy prior to MRI being approved. States pain is bearable when he is sitting but is worse when he is walking. States he feels like he does not walk normal due to his pain. States he is open to having surgery if that is an open for him. Denies any loss of bowel or bladder.   Carpal Tunnel: States he has had a flare of his carpal tunnel symptoms. Has not been seen by ortho for this. Does not wear a splint. States pain is intermittent. Denies increased weakness or inability to function.   The following portions of the patient's history were reviewed and updated as appropriate: past medical history, past surgical history, family history, social history, allergies, medications, and problem list.   Patient Active Problem List   Diagnosis Date Noted   Hyperlipidemia LDL goal <55 01/11/2024   CAD S/P percutaneous coronary angioplasty 11/15/2023   Stroke (HCC) 11/14/2023   Chronic low back pain 10/26/2023   NSTEMI (non-ST elevated myocardial infarction) (HCC) 09/25/2023   Impaired gait 03/31/2023   Type 2 diabetes mellitus with hyperglycemia, without long-term current use of insulin  (HCC) 03/31/2023   Neuropathic pain 03/31/2023   Spinal stenosis of lumbar region 04/22/2020   Acute pain of left knee 09/17/2019   Protrusion of lumbar intervertebral disc 05/29/2019   SOB (shortness of breath) 05/10/2013   Essential hypertension, benign 04/24/2013   Chest pain, unspecified 04/24/2013   Past Medical History:  Diagnosis  Date   Asthma    Diabetes mellitus without complication (HCC)    Eczema    GERD (gastroesophageal reflux disease)    Hypertension    Myocardial infarction (HCC)    Sciatica    Stroke [facility])    Past Surgical History:  Procedure Laterality Date   CORONARY STENT INTERVENTION N/A 09/26/2023   Procedure: CORONARY STENT INTERVENTION;  Surgeon: [name], [name], MD;  Location: [facility] INVASIVE CV LAB;  Service: Cardiovascular;  Laterality: N/A;   HERNIA REPAIR     LEFT HEART CATH AND CORONARY ANGIOGRAPHY N/A 09/26/2023   Procedure: LEFT HEART CATH AND CORONARY ANGIOGRAPHY;  Surgeon: [name], [name], MD;  Location[facility] INVASIVE CV LAB;  Service: Cardiovascular;  Laterality: N/A;   Family History  Problem Relation Age of Onset   Cancer Mother    Cancer Father    Hypertension Sister    Heart murmur Sister    CVA Brother    CVA Brother    Hypertension Brother    Colon polyps Neg Hx    Colon cancer Neg Hx    Esophageal cancer Neg Hx    Pancreatic cancer Neg Hx    Stomach cancer Neg Hx    Outpatient Medications Prior to Visit  Medication Sig Dispense Refill   amLODipine  (NORVASC ) 10 MG tablet Take 1 tablet (10 mg total) by mouth daily. 90 tablet 3   aspirin  EC 81 MG tablet Take 1 tablet (81 mg total) by mouth daily. Swallow whole.  90 tablet 3   atorvastatin  (LIPITOR) 40 MG tablet Take 1 tablet (40 mg total) by mouth daily. 90 tablet 3   clopidogrel  (PLAVIX ) 75 MG tablet Take 1 tablet (75 mg total) by mouth daily. 90 tablet 3   DULoxetine  (CYMBALTA ) 60 MG capsule Take 1 capsule (60 mg total) by mouth daily for 7 days, THEN 1 capsule (60 mg total) 2 (two) times daily 60 capsule 0   empagliflozin  (JARDIANCE ) 10 MG TABS tablet Take 1 tablet (10 mg total) by mouth daily. 30 tablet 11   ezetimibe  (ZETIA ) 10 MG tablet Take 1 tablet (10 mg total) by mouth daily. 90 tablet 3   metFORMIN  (GLUCOPHAGE ) 1000 MG tablet Take 1 tablet (1,000 mg total) by mouth 2 (two) times daily with a meal. 180 tablet 3    metoprolol  succinate (TOPROL  XL) 25 MG 24 hr tablet Take 1 tablet (25 mg total) by mouth daily. 90 tablet 3   mupirocin  ointment (BACTROBAN ) 2 % Apply 1 Application topically 2 (two) times daily. 22 g 0   nitroGLYCERIN  (NITROSTAT ) 0.4 MG SL tablet Place 1 tablet (0.4 mg total) under the tongue every 5 (five) minutes x 3 doses as needed for chest pain. 25 tablet 0   ondansetron  (ZOFRAN -ODT) 4 MG disintegrating tablet Take 4 mg by mouth every 8 (eight) hours as needed for nausea or vomiting.     pantoprazole  (PROTONIX ) 40 MG tablet Take 1 tablet (40 mg total) by mouth daily. 30 tablet 5   rOPINIRole  (REQUIP ) 0.5 MG tablet Take 1-2 tablets (0.5-1 mg total) by mouth 1 to 3 hours before bedtime. 60 tablet 1   traMADol  (ULTRAM ) 50 MG tablet Take 1 tablet (50 mg total) by mouth 3 (three) times daily as needed. 90 tablet 2   ezetimibe  (ZETIA ) 10 MG tablet Take 1 tablet (10 mg total) by mouth daily. (Patient not taking: Reported on 04/24/2024) 90 tablet 3   losartan  (COZAAR ) 100 MG tablet Take 1 tablet (100 mg total) by mouth daily. (Patient not taking: Reported on 04/24/2024) 90 tablet 3   No facility-administered medications prior to visit.   Allergies[1]   ROS: A complete ROS was performed with pertinent positives/negatives noted in the HPI. The remainder of the ROS are negative.    Objective:   Today's Vitals   04/24/24 1258 04/24/24 1339  BP: (!) 180/98 (!) 173/90  Pulse: 62   SpO2: 99%   Weight: 139 lb 9.6 oz (63.3 kg)   Height: 5' 5 (1.651 m)     GENERAL: Well-appearing, in NAD. Well nourished.  RESPIRATORY: Chest wall symmetrical. Respirations even and non-labored. Breath sounds clear to auscultation bilaterally.  CARDIAC: S1, S2 present, regular rate and rhythm without murmur or gallops. Peripheral pulses 2+ bilaterally.  MSK:  LLE: 4/5 - states baseline after stroke RLE: 5/5 LUE: 5/5 RUE: 5/5 EXTREMITIES: Without clubbing, cyanosis, or edema.  NEUROLOGIC: No motor or sensory  deficits. Steady, even gait. C2-C12 intact.  PSYCH/MENTAL STATUS: Alert, oriented x 3. Cooperative, appropriate mood and affect.    No results found for any visits on 04/24/24.    Assessment & Plan:  1. Spinal stenosis of lumbar region without neurogenic claudication (Primary) 2. Neuropathic pain Discussed placing a referral for pain management to help with his chronic pain. Discussed because of his extensive history there are very few medications he can take. Patient verbalized understanding and shared he did not want to just take medication but would like to know if he has options for pain  relief. Discussed his need for A1C to be stable in order for most surgeons to do back surgery due to delayed healing in the setting of uncontrolled diabetes. Patient verbalized understanding and is going to schedule follow up with PCP to discuss medications. Denies any red flag symptoms.   3. Essential hypertension, benign Patient reports he has not taken his medication today. States his home readings are in the 140s systolic. Denies HA, vision changes, chest pain, or shortness of breath. Discussed following up with PCP for potential medication changes in approximately 2 weeks.  4. Carpal tunnel syndrome of right wrist Discussed symptom management and use of splint to help decrease pain. Discussed the need to follow up with ortho regarding this as well.     Return in about 2 weeks (around 05/08/2024) for discussion of DM & BP with Alexis.    Patient to reach out to office if new, worrisome, or unresolved symptoms arise or if no improvement in patient's condition. Patient verbalized understanding and is agreeable to treatment plan. All questions answered to patient's satisfaction.    Lauraine Almarie Angus DNP, FNP-C       [1] No Known Allergies  "

## 2024-04-24 NOTE — Patient Instructions (Signed)
 Pick up a Thumb Spica Splint for your carpal tunnel symptoms.

## 2024-05-02 NOTE — Therapy (Incomplete)
 " OUTPATIENT PHYSICAL THERAPY THORACOLUMBAR EVALUATION   Patient Name: Peter Bell MRN: [id number] DOB:October 10, 1970, 54 y.o., male Today's Date: 05/02/2024  END OF SESSION:   Past Medical History:  Diagnosis Date   Asthma    Diabetes mellitus without complication (HCC)    Eczema    GERD (gastroesophageal reflux disease)    Hypertension    Myocardial infarction (HCC)    Sciatica    Stroke [facility])    Past Surgical History:  Procedure Laterality Date   CORONARY STENT INTERVENTION N/A 09/26/2023   Procedure: CORONARY STENT INTERVENTION;  Surgeon: [name], [name], MD;  Location: [facility] INVASIVE CV LAB;  Service: Cardiovascular;  Laterality: N/A;   HERNIA REPAIR     LEFT HEART CATH AND CORONARY ANGIOGRAPHY N/A 09/26/2023   Procedure: LEFT HEART CATH AND CORONARY ANGIOGRAPHY;  Surgeon: [name], [name], MD;  Location: [facility] INVASIVE CV LAB;  Service: Cardiovascular;  Laterality: N/A;   Patient Active Problem List   Diagnosis Date Noted   Hyperlipidemia LDL goal <55 01/11/2024   CAD S/P percutaneous coronary angioplasty 11/15/2023   Stroke (HCC) 11/14/2023   Chronic low back pain 10/26/2023   NSTEMI (non-ST elevated myocardial infarction) (HCC) 09/25/2023   Impaired gait 03/31/2023   Type 2 diabetes mellitus with hyperglycemia, without long-term current use of insulin  (HCC) 03/31/2023   Neuropathic pain 03/31/2023   Spinal stenosis of lumbar region 04/22/2020   Acute pain of left knee 09/17/2019   Protrusion of lumbar intervertebral disc 05/29/2019   SOB (shortness of breath) 05/10/2013   Essential hypertension, benign 04/24/2013   Chest pain, unspecified 04/24/2013    PCP: Knute Thersia Bitters, FNP   REFERRING PROVIDER: Georgina Ozell DELENA, MD   REFERRING DIAG: M54.16 (ICD-10-CM) - Lumbar radiculopathy   Rationale for Evaluation and Treatment: Rehabilitation  THERAPY DIAG:  No diagnosis found.  ONSET DATE: ***  SUBJECTIVE:                                                                                                                                                                                            SUBJECTIVE STATEMENT: ***  PERTINENT HISTORY:  04/16/24 office visit c Dt. Moore: Patient is a 54 y.o. male who presents today for lumbar spine.  Patient has had low back pain that radiates into the right buttock and right lateral thigh.  Has been present for a year.  There is no trauma or injury that preceded the onset of the pain.  He has no pain radiating of the left lower extremity.  Pain has gotten progressively worse and now radiates into the right anterolateral leg.  It does not  radiate past the proximal aspect of the leg.  He feels the pain is worse with activity but he notes it also with rest.  No bowel or bladder incontinence.  No saddle anesthesia.   PAIN:  Are you having pain? Yes: NPRS scale: *** Pain location: *** Pain description: *** Aggravating factors: *** Relieving factors: ***  PRECAUTIONS: {Therapy precautions:24002}  RED FLAGS: {PT Red Flags:29287}   WEIGHT BEARING RESTRICTIONS: {Yes ***/No:24003}  FALLS:  Has patient fallen in last 6 months? {fallsyesno:27318}  LIVING ENVIRONMENT: Lives with: {[facility] lives with:25569::lives with their family} Lives in: {Lives in:25570} Stairs: {opstairs:27293} Has following equipment at home: {Assistive devices:23999}  OCCUPATION: ***  PLOF: {PLOF:24004}  PATIENT GOALS: ***  NEXT MD VISIT: ***  OBJECTIVE:  Note: Objective measures were completed at Evaluation unless otherwise noted.  DIAGNOSTIC FINDINGS:  04/16/24 Lumbar Spine XRs of the lumbar spine from 04/16/2024 were independently reviewed and  interpreted, showing anterior osteophyte formation at L1/2, L2/3, L4/5,  L5/S1.  Disc height loss at L4/5 and L5/S1.  Vacuum disc phenomenon at  L5/S1.  No evidence of instability on flexion/extension views.  No  fracture or dislocation seen.   MRI ordered for the lumbar  spine    PATIENT SURVEYS:  Oswestry Low Back Pain Disability Questionnaire:   Interpretation of scores: Score Category Description  0-20% Minimal Disability The patient can cope with most living activities. Usually no treatment is indicated apart from advice on lifting, sitting and exercise  21-40% Moderate Disability The patient experiences more pain and difficulty with sitting, lifting and standing. Travel and social life are more difficult and they may be disabled from work. Personal care, sexual activity and sleeping are not grossly affected, and the patient can usually be managed by conservative means  41-60% Severe Disability Pain remains the main problem in this group, but activities of daily living are affected. These patients require a detailed investigation  61-80% Crippled Back pain impinges on all aspects of the patients life. Positive intervention is required  81-100% Bed-bound These patients are either bed-bound or exaggerating their symptoms  Reference: Adelle SPEAK, [place]sent PB. The Oswestry Disability Index. Spine 2000 Nov 15;25(22):2940-52; discussion 52.  Minimum detectable change (90% confidence): 10% points   COGNITION: Overall cognitive status: {cognition:24006}     SENSATION: {sensation:27233}  MUSCLE LENGTH: Hamstrings: Right *** deg; Left *** deg Debby test: Right *** deg; Left *** deg  POSTURE: {posture:25561}  PALPATION: ***  LUMBAR ROM:   AROM eval  Flexion   Extension   Right lateral flexion   Left lateral flexion   Right rotation   Left rotation    (Blank rows = not tested)  LOWER EXTREMITY ROM:     {AROM/PROM:27142}  Right eval Left eval  Hip flexion    Hip extension    Hip abduction    Hip adduction    Hip internal rotation    Hip external rotation    Knee flexion    Knee extension    Ankle dorsiflexion    Ankle plantarflexion    Ankle inversion    Ankle eversion     (Blank rows = not tested)  LOWER EXTREMITY MMT:    MMT  Right eval Left eval  Hip flexion    Hip extension    Hip abduction    Hip adduction    Hip internal rotation    Hip external rotation    Knee flexion    Knee extension    Ankle dorsiflexion    Ankle  plantarflexion    Ankle inversion    Ankle eversion     (Blank rows = not tested)  LUMBAR SPECIAL TESTS:  {lumbar special test:25242}  FUNCTIONAL TESTS:  {Functional tests:24029}  GAIT: Distance walked: *** Assistive device utilized: {Assistive devices:23999} Level of assistance: {Levels of assistance:24026} Comments: ***  TREATMENT DATE:  [facility] Adult PT Treatment:                                                DATE: 05/03/24 Therapeutic Exercise: *** Manual Therapy: *** Neuromuscular re-ed: *** Therapeutic Activity: *** Modalities: *** Self Care: ***                                                                                                                                PATIENT EDUCATION:  Education details: Eval findings, POC, HEP, self care  Person educated: Patient Education method: Explanation, Demonstration, Tactile cues, Verbal cues, and Handouts Education comprehension: verbalized understanding, returned demonstration, verbal cues required, and tactile cues required  HOME EXERCISE PROGRAM: ***  ASSESSMENT:  CLINICAL IMPRESSION: Patient is a 54 y.o. male who was seen today for physical therapy evaluation and treatment for M54.16 (ICD-10-CM) - Lumbar radiculopathy .   OBJECTIVE IMPAIRMENTS: {opptimpairments:25111}.   ACTIVITY LIMITATIONS: {activitylimitations:27494}  PARTICIPATION LIMITATIONS: {participationrestrictions:25113}  PERSONAL FACTORS: {Personal factors:25162} are also affecting patient's functional outcome.   REHAB POTENTIAL: {rehabpotential:25112}  CLINICAL DECISION MAKING: {clinical decision making:25114}  EVALUATION COMPLEXITY: {Evaluation complexity:25115}   GOALS:  SHORT TERM GOALS: Target date: 05/18/24  Pt will be Ind  in an initial HEP  Baseline:started Goal status: INITIAL  2.  *** Baseline:  Goal status: INITIAL  3.  *** Baseline:  Goal status: INITIAL  4.  *** Baseline:  Goal status: INITIAL  5.  *** Baseline:  Goal status: INITIAL  6.  *** Baseline:  Goal status: INITIAL  LONG TERM GOALS: Target date: ***  Pt will be Ind in a final HEP to maintain achieved LOF  Baseline: started Goal status: INITIAL  2.  *** Baseline:  Goal status: INITIAL  3.  *** Baseline:  Goal status: INITIAL  4.  *** Baseline:  Goal status: INITIAL  5.  *** Baseline:  Goal status: INITIAL  6.  *** Baseline:  Goal status: INITIAL  PLAN:  PT FREQUENCY: {rehab frequency:25116}  PT DURATION: {rehab duration:25117}  PLANNED INTERVENTIONS: {rehab planned interventions:25118::97110-Therapeutic exercises,97530- Therapeutic [id number]- Neuromuscular re-education,97535- Self Rjmz,02859- Manual therapy,Patient/Family education}.  PLAN FOR NEXT SESSION: Review FOTO; assess response to HEP; progress therex as indicated; use of modalities, manual therapy; and TPDN as indicated.  Venie Montesinos MS, PT 05/02/2024 7:49 AM   "

## 2024-05-03 ENCOUNTER — Ambulatory Visit: Attending: Family Medicine

## 2024-05-03 DIAGNOSIS — M5416 Radiculopathy, lumbar region: Secondary | ICD-10-CM | POA: Insufficient documentation

## 2024-05-03 DIAGNOSIS — R2689 Other abnormalities of gait and mobility: Secondary | ICD-10-CM | POA: Insufficient documentation

## 2024-05-03 DIAGNOSIS — M6281 Muscle weakness (generalized): Secondary | ICD-10-CM | POA: Insufficient documentation

## 2024-05-05 ENCOUNTER — Ambulatory Visit (HOSPITAL_COMMUNITY)

## 2024-05-05 ENCOUNTER — Ambulatory Visit (HOSPITAL_COMMUNITY)
Admission: EM | Admit: 2024-05-05 | Discharge: 2024-05-05 | Disposition: A | Discharge status: Home / Self Care | Attending: Emergency Medicine | Admitting: Emergency Medicine

## 2024-05-05 ENCOUNTER — Encounter (HOSPITAL_COMMUNITY): Payer: Self-pay

## 2024-05-05 DIAGNOSIS — M545 Low back pain, unspecified: Secondary | ICD-10-CM | POA: Diagnosis not present

## 2024-05-05 DIAGNOSIS — S161XXA Strain of muscle, fascia and tendon at neck level, initial encounter: Secondary | ICD-10-CM

## 2024-05-05 DIAGNOSIS — I10 Essential (primary) hypertension: Secondary | ICD-10-CM
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
## 2024-05-06 ENCOUNTER — Ambulatory Visit (HOSPITAL_COMMUNITY)

## 2024-05-07 ENCOUNTER — Ambulatory Visit (HOSPITAL_COMMUNITY): Payer: Self-pay

## 2024-05-08 ENCOUNTER — Ambulatory Visit (HOSPITAL_BASED_OUTPATIENT_CLINIC_OR_DEPARTMENT_OTHER): Admitting: Family Medicine

## 2024-05-08 ENCOUNTER — Other Ambulatory Visit: Payer: Self-pay

## 2024-05-08 ENCOUNTER — Ambulatory Visit

## 2024-05-08 DIAGNOSIS — M5416 Radiculopathy, lumbar region: Secondary | ICD-10-CM

## 2024-05-08 DIAGNOSIS — R2689 Other abnormalities of gait and mobility: Secondary | ICD-10-CM

## 2024-05-08 DIAGNOSIS — M6281 Muscle weakness (generalized): Secondary | ICD-10-CM
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
## 2024-05-17 ENCOUNTER — Ambulatory Visit

## 2024-05-17 DIAGNOSIS — M5416 Radiculopathy, lumbar region: Secondary | ICD-10-CM

## 2024-05-17 DIAGNOSIS — M6281 Muscle weakness (generalized): Secondary | ICD-10-CM

## 2024-05-17 DIAGNOSIS — R2689 Other abnormalities of gait and mobility: Secondary | ICD-10-CM
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
## 2024-05-22 ENCOUNTER — Ambulatory Visit
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
## 2024-05-24 ENCOUNTER — Ambulatory Visit

## 2024-05-25 ENCOUNTER — Telehealth: Payer: Self-pay

## 2024-05-25 ENCOUNTER — Ambulatory Visit (HOSPITAL_BASED_OUTPATIENT_CLINIC_OR_DEPARTMENT_OTHER): Admitting: Family Medicine

## 2024-05-25 NOTE — Telephone Encounter (Signed)
 2 NS, VM full

## 2024-05-29 ENCOUNTER — Ambulatory Visit: Attending: Family Medicine

## 2024-05-30 ENCOUNTER — Other Ambulatory Visit: Payer: Self-pay

## 2024-05-31 ENCOUNTER — Ambulatory Visit

## 2024-05-31 ENCOUNTER — Telehealth: Payer: Self-pay

## 2024-05-31 NOTE — Telephone Encounter (Signed)
 Unable to LVM d/t mailbox is full. -MJ

## 2024-05-31 NOTE — Telephone Encounter (Signed)
 For review-ok to fill? Do not see where we have filled in the past

## 2024-06-01 ENCOUNTER — Telehealth: Payer: Self-pay | Admitting: Orthopedic Surgery

## 2024-06-01 DIAGNOSIS — M5416 Radiculopathy, lumbar region: Secondary | ICD-10-CM

## 2024-06-01 MED ORDER — EMPAGLIFLOZIN 10 MG PO TABS: 10.0000 mg | ORAL_TABLET | Freq: Every day | ORAL | 2 refills | Status: AC

## 2024-06-01 NOTE — Telephone Encounter (Signed)
 Pt called saying tha he only has 2 more physical therapy apts before he can get his MRI. He wants to knoe if he can go ahead and get his MRI. Call back number is [phone].

## 2024-06-05 ENCOUNTER — Ambulatory Visit

## 2024-06-07 ENCOUNTER — Ambulatory Visit

## 2024-06-16 ENCOUNTER — Other Ambulatory Visit
